# Patient Record
Sex: Male | Born: 1965 | Race: White | Hispanic: No | Marital: Single | Smoking: Former smoker
Health system: Southern US, Community
[De-identification: ages and names within clinical notes are randomized; demographics above are authoritative.]

## PROBLEM LIST (undated history)

## (undated) DIAGNOSIS — F32A Depression, unspecified: Secondary | ICD-10-CM

## (undated) DIAGNOSIS — F329 Major depressive disorder, single episode, unspecified: Secondary | ICD-10-CM

## (undated) DIAGNOSIS — F419 Anxiety disorder, unspecified: Secondary | ICD-10-CM

## (undated) DIAGNOSIS — I10 Essential (primary) hypertension: Secondary | ICD-10-CM

---

## 2000-01-19 ENCOUNTER — Emergency Department (HOSPITAL_COMMUNITY): Admission: EM | Admit: 2000-01-19 | Discharge: 2000-01-19 | Payer: Self-pay | Admitting: *Deleted

## 2000-02-04 ENCOUNTER — Emergency Department (HOSPITAL_COMMUNITY): Admission: EM | Admit: 2000-02-04 | Discharge: 2000-02-04 | Payer: Self-pay | Admitting: Emergency Medicine

## 2017-03-23 NOTE — Congregational Nurse Program (Signed)
Congregational Nurse Program Note  Date of Encounter: 03/17/2017  Past Medical History: No past medical history on file.  Encounter Details:     CNP Questionnaire - 03/17/17 1919      Patient Demographics   Is this a new or existing patient? New   Patient is considered a/an Not Applicable   Race Caucasian/White     Patient Assistance   Location of Patient Assistance Not Applicable   Patient's financial/insurance status Low Income;Self-Pay (Uninsured)   Uninsured Patient ([place] Card/Care Connects) Yes   Interventions Counseled to make appt. with provider;Assisted patient in making appt.   Patient referred to apply for the following financial assistance [employer]/Care Connect[employer]   Food insecurities addressed Not Applicable   Transportation assistance No   Assistance securing medications Yes   Type of Designer, [facility]   Educational health offerings Behavioral health     Encounter Details   Primary purpose of visit Chronic Illness/Condition Visit;Navigating the Healthcare System   Was an Emergency Department visit averted? Not Applicable   Does patient have a medical provider? No   Patient referred to Clinic   Was a mental health screening completed? (GAINS tool) No   Does patient have dental issues? No   Does patient have vision issues? No   Does your patient have an abnormal blood pressure today? No   Since previous encounter, have you referred patient for abnormal blood pressure that resulted in a new diagnosis or medication change? No   Does your patient have an abnormal blood glucose today? No   Since previous encounter, have you referred patient for abnormal blood glucose that resulted in a new diagnosis or medication change? No   Was there a life-saving intervention made? No     [facility].  Referred to [facility] Ann [facility] for [facility].  [facility] and [facility]  [facility]rapy

## 2017-04-20 NOTE — Congregational Nurse Program (Signed)
Congregational Nurse Program Note  Date of Encounter: 04/05/2017  Past Medical History: No past medical history on file.  Encounter Details:     CNP Questionnaire - 04/05/17 1723      Patient Demographics   Is this a new or existing patient? Existing   Patient is considered a/an Not Applicable   Race Caucasian/White     Patient Assistance   Location of Patient Assistance Not Applicable   Patient's financial/insurance status Low Income;Self-Pay (Uninsured)   Uninsured Patient ([place] Card/Care Connects) Yes   Interventions Counseled to make appt. with provider;Assisted patient in making appt.   Patient referred to apply for the following financial assistance [employer]/Care Connects   Food insecurities addressed Not Applicable   Transportation assistance No   Assistance securing medications No   Sales promotion account executiveducational health offerings Behavioral health     Encounter Details   Primary purpose of visit Chronic Illness/Condition Visit;Navigating the Healthcare System   Was an Emergency Department visit averted? Not Applicable   Does patient have a medical provider? No   Patient referred to Clinic   Was a mental health screening completed? (GAINS tool) No   Does patient have dental issues? No   Does patient have vision issues? No   Does your patient have an abnormal blood pressure today? No   Since previous encounter, have you referred patient for abnormal blood pressure that resulted in a new diagnosis or medication change? No   Does your patient have an abnormal blood glucose today? No   Since previous encounter, have you referred patient for abnormal blood glucose that resulted in a new diagnosis or medication change? No   Was there a life-saving intervention made? No     Client came by clinic to report that he is "doing well".  [facility]
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
## 2017-05-17 NOTE — Congregational Nurse Program (Signed)
Congregational Nurse Program Note  Date of Encounter: 05/01/2017  Past Medical History: No past medical history on file.  Encounter Details:     CNP Questionnaire - 05/10/17 1637      Patient Demographics   Is this a new or existing patient? New   Patient is considered a/an Not Applicable   Race Caucasian/White     Patient Assistance   Location of Patient Assistance Not Applicable   Patient's financial/insurance status Low Income;Self-Pay (Uninsured)   Uninsured Patient ([place] Card/Care Connects) Yes   Interventions Follow-up/Education/Support provided after completed appt.   Patient referred to apply for the following financial assistance Not Applicable   Food insecurities addressed Not Applicable   Transportation assistance Yes   Type of Assistance Bus Pass Given   Assistance securing medications No   Educational health offerings Behavioral health;Spiritual care     Encounter Details   Primary purpose of visit Chronic Illness/Condition Visit;Safety   Was an Emergency Department visit averted? Not Applicable   Does patient have a medical provider? Yes   Patient referred to Not Applicable   Was a mental health screening completed? (GAINS tool) No   Does patient have dental issues? No   Does patient have vision issues? No   Does your patient have an abnormal blood pressure today? No   Since previous encounter, have you referred patient for abnormal blood pressure that resulted in a new diagnosis or medication change? No   Does your patient have an abnormal blood glucose today? No   Since previous encounter, have you referred patient for abnormal blood glucose that resulted in a new diagnosis or medication change? No   Was there a life-saving intervention made? No     Needed assistance understanding the medicaid application.  Bus passes given to obtain medications from the health department

## 2017-05-17 NOTE — Congregational Nurse Program (Signed)
Congregational Nurse Program Note  Date of Encounter: 05/10/2017  Past Medical History: No past medical history on file.  Encounter Details:     CNP Questionnaire - 05/10/17 1637      Patient Demographics   Is this a new or existing patient? New   Patient is considered a/an Not Applicable   Race Caucasian/White     Patient Assistance   Location of Patient Assistance Not Applicable   Patient's financial/insurance status Low Income;Self-Pay (Uninsured)   Uninsured Patient ([place] Card/Care Connects) Yes   Interventions Follow-up/Education/Support provided after completed appt.   Patient referred to apply for the following financial assistance Not Applicable   Food insecurities addressed Not Applicable   Transportation assistance Yes   Type of Assistance Bus Pass Given   Assistance securing medications No   Educational health offerings Behavioral health;Spiritual care     Encounter Details   Primary purpose of visit Chronic Illness/Condition Visit;Safety   Was an Emergency Department visit averted? Not Applicable   Does patient have a medical provider? Yes   Patient referred to Not Applicable   Was a mental health screening completed? (GAINS tool) No   Does patient have dental issues? No   Does patient have vision issues? No   Does your patient have an abnormal blood pressure today? No   Since previous encounter, have you referred patient for abnormal blood pressure that resulted in a new diagnosis or medication change? No   Does your patient have an abnormal blood glucose today? No   Since previous encounter, have you referred patient for abnormal blood glucose that resulted in a new diagnosis or medication change? No   Was there a life-saving intervention made? No     Checking in.  Reported has been signed up [facility]  Bus passes to obtain medications

## 2017-05-25 NOTE — Congregational Nurse Program (Signed)
Congregational Nurse Program Note  Date of Encounter: 05/10/2017  Past Medical History: No past medical history on file.  Encounter Details:     CNP Questionnaire - 05/10/17 1637      Patient Demographics   Is this a new or existing patient? New   Patient is considered a/an Not Applicable   Race Caucasian/White     Patient Assistance   Location of Patient Assistance Not Applicable   Patient's financial/insurance status Low Income;Self-Pay (Uninsured)   Uninsured Patient ([place] Card/Care Connects) Yes   Interventions Follow-up/Education/Support provided after completed appt.   Patient referred to apply for the following financial assistance Not Applicable   Food insecurities addressed Not Applicable   Transportation assistance Yes   Type of Assistance Bus Pass Given   Assistance securing medications No   Educational health offerings Behavioral health;Spiritual care     Encounter Details   Primary purpose of visit Chronic Illness/Condition Visit;Safety   Was an Emergency Department visit averted? Not Applicable   Does patient have a medical provider? Yes   Patient referred to Not Applicable   Was a mental health screening completed? (GAINS tool) No   Does patient have dental issues? No   Does patient have vision issues? No   Does your patient have an abnormal blood pressure today? No   Since previous encounter, have you referred patient for abnormal blood pressure that resulted in a new diagnosis or medication change? No   Does your patient have an abnormal blood glucose today? No   Since previous encounter, have you referred patient for abnormal blood glucose that resulted in a new diagnosis or medication change? No   Was there a life-saving intervention made? No      Reports that he is [facility] and that is helping.  Requested bus passes to obtain medications from the health department.  Bus passes provided

## 2017-06-30 NOTE — Congregational Nurse Program (Signed)
Congregational Nurse Program Note  Date of Encounter: 06/05/2017  Past Medical History: No past medical history on file.  Encounter Details:     CNP Questionnaire - 06/05/17 0946      Patient Demographics   Is this a new or existing patient? Existing   Patient is considered a/an Not Applicable   Race Caucasian/White     Patient Assistance   Location of Patient Assistance Not Applicable   Patient's financial/insurance status Low Income;Self-Pay (Uninsured)   Uninsured Patient ([place] Card/Care Connects) Yes   Interventions Counseled to make appt. with provider   Patient referred to apply for the following financial assistance Not Applicable   Food insecurities addressed Not Applicable   Transportation assistance Yes   Type of Assistance Bus Pass Given   Assistance securing medications No   Educational health offerings Behavioral health;Spiritual care     Encounter Details   Primary purpose of visit Chronic Illness/Condition Visit;Safety   Was an Emergency Department visit averted? Not Applicable   Does patient have a medical provider? Yes   Patient referred to Not Applicable   Was a mental health screening completed? (GAINS tool) No   Does patient have dental issues? No   Does patient have vision issues? No   Does your patient have an abnormal blood pressure today? No   Since previous encounter, have you referred patient for abnormal blood pressure that resulted in a new diagnosis or medication change? No   Does your patient have an abnormal blood glucose today? No   Since previous encounter, have you referred patient for abnormal blood glucose that resulted in a new diagnosis or medication change? No   Was there a life-saving intervention made? No      Requesting assistance with obtaining medications.  Wanting refills.  Referred back to provider, Lavinia SharpsMary Ann Placey NP.  Bus passes provided so he could pick up meds at the health department

## 2017-12-01 NOTE — Congregational Nurse Program (Signed)
Congregational Nurse Program Note  Date of Encounter: 11/10/2017  Past Medical History: No past medical history on file.  Encounter Details: CNP Questionnaire - 11/10/17 1042      Questionnaire   Patient Status  Not Applicable    Location Patient Served At  Not Applicable    Insurance  Not Applicable    Uninsured  Uninsured (NEW 1x/quarter)    Food  Yes, have food insecurities;Within past 12 months, worried food would run out with no money to buy more    Housing/Utilities  No permanent housing    Transportation  Yes, need transportation assistance;Provided transportation assistance (bus pass, taxi voucher, etc.)    Interpersonal Safety  No, do not feel physically and emotionally safe where you currently live    Medication  Yes, have medication insecurities    Medical Provider  Yes    Referrals  Medication Assistance    ED Visit Averted  Not Applicable    Life-Saving Intervention Made  Not Applicable      Requested assistance with obtaining medications at the Health Department.  Bus passes provided

## 2018-07-16 ENCOUNTER — Emergency Department (HOSPITAL_COMMUNITY)
Admission: EM | Admit: 2018-07-16 | Discharge: 2018-07-17 | Disposition: A | Discharge status: Other Acute Inpatient Hospital | Payer: Self-pay | Attending: Emergency Medicine | Admitting: Emergency Medicine

## 2018-07-16 ENCOUNTER — Other Ambulatory Visit: Payer: Self-pay

## 2018-07-16 DIAGNOSIS — F1910 Other psychoactive substance abuse, uncomplicated: Secondary | ICD-10-CM | POA: Insufficient documentation

## 2018-07-16 DIAGNOSIS — F332 Major depressive disorder, recurrent severe without psychotic features: Secondary | ICD-10-CM | POA: Insufficient documentation

## 2018-07-16 DIAGNOSIS — F315 Bipolar disorder, current episode depressed, severe, with psychotic features: Secondary | ICD-10-CM

## 2018-07-16 DIAGNOSIS — R45851 Suicidal ideations: Secondary | ICD-10-CM | POA: Insufficient documentation

## 2018-07-16 LAB — CBC
HCT: 51.8 % (ref 39.0–52.0)
Hemoglobin: 19 g/dL — ABNORMAL HIGH (ref 13.0–17.0)
MCH: 35.2 pg — ABNORMAL HIGH (ref 26.0–34.0)
MCHC: 36.7 g/dL — ABNORMAL HIGH (ref 30.0–36.0)
MCV: 95.9 fL (ref 78.0–100.0)
Platelets: 230 10*3/uL (ref 150–400)
RBC: 5.4 MIL/uL (ref 4.22–5.81)
RDW: 12.4 % (ref 11.5–15.5)
WBC: 10.9 10*3/uL — ABNORMAL HIGH (ref 4.0–10.5)

## 2018-07-16 LAB — COMPREHENSIVE METABOLIC PANEL
ALT: 26 U/L (ref 0–44)
AST: 27 U/L (ref 15–41)
Albumin: 4.4 g/dL (ref 3.5–5.0)
Alkaline Phosphatase: 96 U/L (ref 38–126)
Anion gap: 18 — ABNORMAL HIGH (ref 5–15)
BUN: 7 mg/dL (ref 6–20)
CO2: 21 mmol/L — ABNORMAL LOW (ref 22–32)
Calcium: 9.2 mg/dL (ref 8.9–10.3)
Chloride: 97 mmol/L — ABNORMAL LOW (ref 98–111)
Creatinine, Ser: 0.94 mg/dL (ref 0.61–1.24)
GFR calc Af Amer: >60 mL/min (ref >60)
GFR calc non Af Amer: >60 mL/min (ref >60)
Glucose, Bld: 137 mg/dL — ABNORMAL HIGH (ref 70–99)
Potassium: 3.9 mmol/L (ref 3.5–5.1)
Sodium: 136 mmol/L (ref 135–145)
Total Bilirubin: 0.8 mg/dL (ref 0.3–1.2)
Total Protein: 8.6 g/dL — ABNORMAL HIGH (ref 6.5–8.1)

## 2018-07-16 LAB — RAPID URINE DRUG SCREEN, HOSP PERFORMED
Amphetamines: NOT DETECTED
Barbiturates: NOT DETECTED
Benzodiazepines: NOT DETECTED
Cocaine: NOT DETECTED
Opiates: NOT DETECTED
Tetrahydrocannabinol: POSITIVE — AB

## 2018-07-16 LAB — ETHANOL: Alcohol, Ethyl (B): 173 mg/dL — ABNORMAL HIGH (ref <10)

## 2018-07-16 MED ORDER — LORAZEPAM 1 MG PO TABS: 0.0000 mg | ORAL_TABLET | Freq: Two times a day (BID) | ORAL | Status: DC

## 2018-07-16 MED ORDER — LISINOPRIL 20 MG PO TABS
40.0000 mg | ORAL_TABLET | Freq: Every day | ORAL | Status: DC
  Administered 2018-07-16 – 2018-07-17 (×2): 40 mg via ORAL
  Filled 2018-07-16 (×2): qty 2

## 2018-07-16 MED ORDER — GABAPENTIN 400 MG PO CAPS
400.0000 mg | ORAL_CAPSULE | Freq: Four times a day (QID) | ORAL | Status: DC
  Administered 2018-07-16 – 2018-07-17 (×4): 400 mg via ORAL
  Filled 2018-07-16 (×4): qty 1

## 2018-07-16 MED ORDER — THIAMINE HCL 100 MG/ML IJ SOLN: 100.0000 mg | Freq: Every day | INTRAMUSCULAR | Status: DC

## 2018-07-16 MED ORDER — VITAMIN B-1 100 MG PO TABS
100.0000 mg | ORAL_TABLET | Freq: Every day | ORAL | Status: DC
  Administered 2018-07-16 – 2018-07-17 (×2): 100 mg via ORAL
  Filled 2018-07-16 (×2): qty 1

## 2018-07-16 MED ORDER — TRAZODONE HCL 50 MG PO TABS
150.0000 mg | ORAL_TABLET | Freq: Every day | ORAL | Status: DC
  Administered 2018-07-16: 150 mg via ORAL
  Filled 2018-07-16: qty 1

## 2018-07-16 MED ORDER — LORAZEPAM 2 MG/ML IJ SOLN: 0.0000 mg | Freq: Two times a day (BID) | INTRAMUSCULAR | Status: DC

## 2018-07-16 MED ORDER — LORAZEPAM 2 MG/ML IJ SOLN: 0.0000 mg | Freq: Four times a day (QID) | INTRAMUSCULAR | Status: DC

## 2018-07-16 MED ORDER — LORAZEPAM 1 MG PO TABS
0.0000 mg | ORAL_TABLET | Freq: Four times a day (QID) | ORAL | Status: DC
  Administered 2018-07-16 – 2018-07-17 (×2): 1 mg via ORAL
  Filled 2018-07-16 (×2): qty 1

## 2018-07-16 MED ORDER — DULOXETINE HCL 30 MG PO CPEP
90.0000 mg | ORAL_CAPSULE | Freq: Every day | ORAL | Status: DC
  Administered 2018-07-16 – 2018-07-17 (×2): 90 mg via ORAL
  Filled 2018-07-16 (×2): qty 3

## 2018-07-16 NOTE — Congregational Nurse Program (Signed)
Client reports he is "in a bad way".  He states he has not slept for two weeks and has not been eating.  States his depression is "very bad" and he states he feels suicidal.  On a scale of 1-10 he reports a 6.  When asked about means, he was hesitant but thought it would be through OD on heroin or medicines he had.  He has a hx of suicidal attempts in the past.  Client states [facility].  Stated he quit going after he started drinking again.  He also has not been taking his medications.  Discussed with client what his options were (seeing therapist on an emergency visit, the ED for psych evaluation or starting back on medications and returning to see me for support and [facility].  Client states, he does not feel safe from self-harm and believes he needs to go to the ED.  Encouraged client to f/u with me to develop a plan of care

## 2018-07-16 NOTE — ED Triage Notes (Addendum)
[place] from an intentional overdose of "snorting heroin". [place] bus depot when EMS was called. EMS gave Narcan twice. He has had SI thoughts for the last two weeks. Patient seen his Therapist this morning before the ingestion of heroin. [facility] and EMS brought patient to the hospital. "I am having problems with my wife and everything else".

## 2018-07-16 NOTE — ED Provider Notes (Signed)
[facility] Emergency Department Provider Note MRN:  [id number]  Arrival date & time: 07/16/18     Chief Complaint   Suicidal   History of Present Illness   Donald Carroll is a 52 y.o. year-old male with a history of depression presenting to the ED with chief complaint of suicidal ideation.  Patient explains that his marriage is recently fell apart, he has had a lot of trouble with his job, a lot of stresses in his life.  For the past 1 to 2 weeks he has felt the desire to kill himself.  He had established a safety plan with his therapist, met with the therapist this morning and told the therapist of the suicidal thoughts.  Therapist was concerned and gave him a bus pass to come to the emergency department.  At the bus stop, but the mid and individual vessels and some heroin, and he took a large dose in an attempt to kill himself.  He thinks that he took this heroin approximately 2 to 3 hours prior to arrival.  Still feels hopeless and wishing that he was dead.  Wants help for his condition.  Denies any pain, no AVH, no HI.  Review of Systems  A complete 10 system review of systems was obtained and all systems are negative except as noted in the HPI and PMH.   Patient's Health History   No past medical history on file.  Depression   No family history on file.  Social history: Separated Social History   Socioeconomic History  . Marital status: Single    Spouse name: Not on file  . Number of children: Not on file  . Years of education: Not on file  . Highest education level: Not on file  Occupational History  . Not on file  Social Needs  . Financial resource strain: Not on file  . Food insecurity:    Worry: Not on file    Inability: Not on file  . Transportation needs:    Medical: Not on file    Non-medical: Not on file  Tobacco Use  . Smoking status: Not on file  Substance and Sexual Activity  . Alcohol use: Not on file  . Drug use: Not on file  .  Sexual activity: Not on file  Lifestyle  . Physical activity:    Days per week: Not on file    Minutes per session: Not on file  . Stress: Not on file  Relationships  . Social connections:    Talks on phone: Not on file    Gets together: Not on file    Attends religious service: Not on file    Active member of club or organization: Not on file    Attends meetings of clubs or organizations: Not on file    Relationship status: Not on file  . Intimate partner violence:    Fear of current or ex partner: Not on file    Emotionally abused: Not on file    Physically abused: Not on file    Forced sexual activity: Not on file  Other Topics Concern  . Not on file  Social History Narrative  . Not on file     Physical Exam  Vital Signs and Nursing Notes reviewed Vitals:   07/16/18 2048 07/16/18 2242  BP: (!) 183/110 (!) 131/91  Pulse: 79 68  Resp: 18 18  Temp: 97.9 F (36.6 C) 97.8 F (36.6 C)  SpO2: 98% 92%  CONSTITUTIONAL: Disheveled-appearing, NAD NEURO:  Alert and oriented x 3, no focal deficits EYES:  eyes equal and reactive ENT/NECK:  no LAD, no JVD CARDIO: Regular rate, well-perfused, normal S1 and S2 PULM:  CTAB no wheezing or rhonchi GI/GU:  normal bowel sounds, non-distended, non-tender MSK/SPINE:  No gross deformities, no edema SKIN:  no rash, atraumatic PSYCH: Depressed speech and behavior  Diagnostic and Interventional Summary    EKG Interpretation  Date/Time:    Ventricular Rate:    PR Interval:    QRS Duration:   QT Interval:    QTC Calculation:   R Axis:     Text Interpretation:        Labs Reviewed  COMPREHENSIVE METABOLIC PANEL - Abnormal; Notable for the following components:      Result Value   Chloride 97 (*)    CO2 21 (*)    Glucose, Bld 137 (*)    Total Protein 8.6 (*)    Anion gap 18 (*)    All other components within normal limits  ETHANOL - Abnormal; Notable for the following components:   Alcohol, Ethyl (B) 173 (*)    All  other components within normal limits  CBC - Abnormal; Notable for the following components:   WBC 10.9 (*)    Hemoglobin 19.0 (*)    MCH 35.2 (*)    MCHC 36.7 (*)    All other components within normal limits  RAPID URINE DRUG SCREEN, HOSP PERFORMED - Abnormal; Notable for the following components:   Tetrahydrocannabinol POSITIVE (*)    All other components within normal limits    No orders to display    Medications  DULoxetine (CYMBALTA) DR capsule 90 mg (90 mg Oral Given 07/16/18 1906)  gabapentin (NEURONTIN) capsule 400 mg (400 mg Oral Given 07/16/18 2228)  lisinopril (PRINIVIL,ZESTRIL) tablet 40 mg (40 mg Oral Given 07/16/18 1906)  traZODone (DESYREL) tablet 150 mg (150 mg Oral Given 07/16/18 2228)  LORazepam (ATIVAN) injection 0-4 mg ( Intravenous See Alternative 07/16/18 2118)    Or  LORazepam (ATIVAN) tablet 0-4 mg (1 mg Oral Given 07/16/18 2118)  LORazepam (ATIVAN) injection 0-4 mg (has no administration in time range)    Or  LORazepam (ATIVAN) tablet 0-4 mg (has no administration in time range)  thiamine (VITAMIN B-1) tablet 100 mg (100 mg Oral Given 07/16/18 2120)    Or  thiamine (B-1) injection 100 mg ( Intravenous See Alternative 07/16/18 2120)     Procedures Critical Care  ED Course and Medical Decision Making  I have reviewed the triage vital signs and the nursing notes.  Pertinent labs & imaging results that were available during my care of the patient were reviewed by me and considered in my medical decision making (see below for details). Clinical Course as of Jul 16 2345  Mon Jul 16, 2018  1720 Medically cleared   [MB]    Clinical Course User Index [MB] Maudie Flakes, MD    Concern for major depressive disorder with suicidality.  Will obtain labs, clear medically, and push for psychiatric admission.  Behavioral health recommends inpatient admission.  Barth Kirks. Sedonia Small, Flor[place] [email or web address]  Final Clinical Impressions(s) / ED Diagnoses     ICD-10-CM   1. Suicidal ideation R45.851     ED Discharge Orders    None         Maudie Flakes, MD 07/16/18 [phone]

## 2018-07-16 NOTE — ED Notes (Signed)
Pt stated "I live in an apartment alone.  I think why I tried to OD on heroin is because of going through a divorce.  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 "  Pt also "I drink a 12 pack a day."

## 2018-07-16 NOTE — BH Assessment (Signed)
[facility] Assessment Progress Note  Case was staffed with Parks FNP who recommended a inpatient admission to assist with stabilization.     

## 2018-07-16 NOTE — ED Notes (Signed)
Bed: WA27 Expected date:  Expected time:  Means of arrival:  Comments: EMS-suicidal 

## 2018-07-16 NOTE — Progress Notes (Signed)
[facility].

## 2018-07-16 NOTE — ED Notes (Signed)
Dr. Pilar PlateBero informed of pt's drinking 12 pack per day, continued elevated b/p and currently with tremors.

## 2018-07-16 NOTE — BH Assessment (Addendum)
Assessment Note  Donald Carroll is an 52 y.o. male that presents this date with S/I and attempted to overdose on Heroin earlier this date in a attempt to take his life. Patient denies any H/I or AVH. Patient denies any previous attempts/gestures at self harm or previous MH inpatient admissions. Patient reports he was diagnosed with depression "years ago" when he resided in [place] although cannot recall time frame or what medications he was prescribed. Patient states he has been receiving services for the last year from [facility] NP) who assists with medication management currently being prescribed Zyprexa, Cymbalta, Gabapentin and Trazodone to assist with ongoing symptoms. Patient reports current compliance although he feels his medications have not been working for the last two months due to increased stress from a "failing marriage." Patient states he "had enough" today after a verbal altercation with his spouse and purchased 1 gram of Heroin and attempted to overdose at the bus stop. Patient reports that was the first time he ever used that substance although reports daily use of Cannabis (1 gram or more for the last year with last use earlier this date 2 grams) and daily alcohol use to include 12 or more 12 oz beers with last use earlier this date when he consumed 4 12 oz beers. UDS is pending. Patient reports current withdrawals to include tremors and agitation. 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 He was [place] bus depot when EMS was called. EMS gave Narcan twice. He has had SI thoughts for the last two weeks. Patient seen his Therapist this morning before the ingestion of heroin. [facility] and EMS brought patient to the  hospital. "I am having problems with my wife and everything else." Case was staffed with Arville Care FNP who recommended a inpatient admission to assist with stabilization.   Diagnosis: F33.2 MDD recurrent without psychotic features, severe, Polysubstance use  Past Medical History: No past medical history on file.    Family History: No family history on file.  Social History:  has no tobacco, alcohol, and drug history on file.  Additional Social History:  Alcohol / Drug Use Pain Medications: See MAR Prescriptions: See MAR Over the Counter: See MAR History of alcohol / drug use?: Yes Longest period of sobriety (when/how long): 1 year 2016 Negative Consequences of Use: Financial, Personal relationships Withdrawal Symptoms: Agitation, Weakness, Tremors Substance #1 Name of Substance 1: Cannabis 1 - Age of First Use: 27 1 - Amount (size/oz): 1 gram  1 - Frequency: Daily 1 - Duration: Last "few days"  1 - Last Use / Amount: 07/16/18 1 gram  Substance #2 Name of Substance 2: Heroin 2 - Age of First Use: 52 2 - Amount (size/oz): 1 gram 2 - Frequency: Pt states "today was the first day" 2 - Duration: 1 day this date 2 - Last Use / Amount: 07/16/18 1 gram Substance #3 Name of Substance 3: Alcohol 3 - Age of First Use: 23 3 - Amount (size/oz): 12 oz beers  3 - Frequency: Daily 3 - Duration: Last five years  3 - Last Use / Amount: 07/16/18 4 12  oz beers  CIWA: CIWA-Ar BP: (!) 181/122 Pulse Rate: (!) 113 COWS:  Allergies: No Known Allergies  Home Medications:  (Not in a hospital admission)  OB/GYN Status:  No LMP for male patient.  General Assessment Data Assessment unable to be completed: Yes Reason for not completing assessment: Pt is impaired Location of Assessment: [facility] TTS Assessment: In system Is this a Tele or Face-to-Face Assessment?: Face-to-Face Is this an Initial Assessment or a Re-assessment for this encounter?: Initial Assessment Patient Accompanied by::  (NA) Language Other than English: No Living Arrangements: (With wife) What gender do you identify as?: Male Marital status: Married Jordan name: NA Pregnancy Status: No Living Arrangements: Spouse/significant other Can pt return to current living arrangement?: Yes Admission Status: Voluntary Is patient capable of signing voluntary admission?: Yes Referral Source: Self/Family/Friend Insurance type: Self Pay  Medical Screening Exam [facility] Walk-in ONLY) Medical Exam completed: Yes  Crisis Care Plan Living Arrangements: Spouse/significant other Legal Guardian: (NA) Name of Psychiatrist: [facility]y NP Name of Therapist: [facility]  Education Status Is patient currently in school?: No Is the patient employed, unemployed or receiving disability?: Employed  Risk to self with the past 6 months Suicidal Ideation: Yes-Currently Present Has patient been a risk to self within the past 6 months prior to admission? : No Suicidal Intent: Yes-Currently Present Has patient had any suicidal intent within the past 6 months prior to admission? : No Is patient at risk for suicide?: Yes Suicidal Plan?: Yes-Currently Present Has patient had any suicidal plan within the past 6 months prior to admission? : No Specify Current Suicidal Plan: Overdose Access to Means: Yes Specify Access to Suicidal Means: Pt had drugs earlier What has been your use of drugs/alcohol within the last 12 months?: Current use Previous Attempts/Gestures: No How many times?: 0 Other Self Harm Risks: NA Triggers for Past Attempts: Unknown Intentional Self Injurious Behavior: None Family Suicide History: No Recent stressful life event(s): Conflict (Comment)(Maritial issues) Persecutory voices/beliefs?: No Depression: Yes Depression Symptoms: Isolating, Fatigue, Guilt, Feeling worthless/self pity Substance abuse history and/or treatment for substance abuse?: Yes Suicide prevention information given to  non-admitted patients: Not applicable  Risk to Others within the past 6 months Homicidal Ideation: No Does patient have any lifetime risk of violence toward others beyond the six months prior to admission? : No Thoughts of Harm to Others: No Current Homicidal Intent: No Current Homicidal Plan: No Access to Homicidal Means: No Identified Victim: NA History of harm to others?: No Assessment of Violence: None Noted Violent Behavior Description: NA Does patient have access to weapons?: No Criminal Charges Pending?: No Does patient have a court date: No Is patient on probation?: No  Psychosis Hallucinations: None noted Delusions: None noted  Mental Status Report Appearance/Hygiene: In scrubs Eye Contact: Good Motor Activity: Unremarkable Speech: Logical/coherent Level of Consciousness: Quiet/awake Mood: Depressed Affect: Appropriate to circumstance Anxiety Level: Minimal Thought Processes: Coherent, Relevant Judgement: Partial Orientation: Person, Place, Time Obsessive Compulsive Thoughts/Behaviors: None  Cognitive Functioning Concentration: Good Memory: Recent Intact, Remote Intact Is patient IDD: No Insight: Good Impulse Control: Poor Appetite: Fair Have you had any weight changes? : No Change Sleep: Decreased Total Hours of Sleep: (Pt reports he has not slept in over three days) Vegetative Symptoms: None  ADLScreening [facility] Assessment Services) Patient's cognitive ability adequate to safely complete daily activities?: Yes Patient able to express need for assistance with ADLs?: Yes Independently performs ADLs?: Yes (appropriate for developmental age)  Prior Inpatient Therapy Prior Inpatient Therapy: No  Prior Outpatient Therapy Prior Outpatient Therapy: Yes Prior Therapy Dates: Ongoing Prior  Therapy Facilty/Provider(s): [facility] Reason for Treatment: Med mang counseling Does patient have an ACCT team?: No Does patient have Intensive  In-House Services?  : No Does patient have [facility] services? : No Does patient have P4CC services?: No  ADL Screening (condition at time of admission) Patient's cognitive ability adequate to safely complete daily activities?: Yes Is the patient deaf or have difficulty hearing?: No Does the patient have difficulty seeing, even when wearing glasses/contacts?: No Does the patient have difficulty concentrating, remembering, or making decisions?: No Patient able to express need for assistance with ADLs?: Yes Does the patient have difficulty dressing or bathing?: No Independently performs ADLs?: Yes (appropriate for developmental age) Does the patient have difficulty walking or climbing stairs?: No Weakness of Legs: None Weakness of Arms/Hands: None  Home Assistive Devices/Equipment Home Assistive Devices/Equipment: None  Therapy Consults (therapy consults require a physician order) PT Evaluation Needed: No OT Evalulation Needed: No SLP Evaluation Needed: No Abuse/Neglect Assessment (Assessment to be complete while patient is alone) Physical Abuse: Denies Verbal Abuse: Denies Sexual Abuse: Denies Exploitation of patient/patient's resources: Denies Self-Neglect: Denies Values / Beliefs Cultural Requests During Hospitalization: None Spiritual Requests During Hospitalization: None Consults Spiritual Care Consult Needed: No Social Work Consult Needed: No Merchant navy officerAdvance Directives (For Healthcare) Does Patient Have a Medical Advance Directive?: No Would patient like information on creating a medical advance directive?: No - Patient declined          Disposition: Case was staffed with Arville CareParks FNP who recommended a inpatient admission to assist with stabilization.   Disposition Initial Assessment Completed for this Encounter: Yes Disposition of Patient: Admit Type of inpatient treatment program: Adult Patient refused recommended treatment: No Mode of transportation if patient is  discharged?: (Unk)  On Site Evaluation by:   Reviewed with Physician:    Alfredia Fergusonavid L Alisah Grandberry 07/16/2018 5:19 PM

## 2018-07-17 ENCOUNTER — Other Ambulatory Visit: Payer: Self-pay

## 2018-07-17 ENCOUNTER — Inpatient Hospital Stay (HOSPITAL_COMMUNITY)
Admission: AD | Admit: 2018-07-17 | Discharge: 2018-07-24 | DRG: 885 | Disposition: A | Discharge status: Home / Self Care | Payer: Federal, State, Local not specified - Other | Source: Intra-hospital | Attending: Psychiatry | Admitting: Psychiatry

## 2018-07-17 ENCOUNTER — Encounter (HOSPITAL_COMMUNITY): Payer: Self-pay | Admitting: *Deleted

## 2018-07-17 DIAGNOSIS — Z915 Personal history of self-harm: Secondary | ICD-10-CM | POA: Diagnosis not present

## 2018-07-17 DIAGNOSIS — G8929 Other chronic pain: Secondary | ICD-10-CM | POA: Diagnosis present

## 2018-07-17 DIAGNOSIS — F1210 Cannabis abuse, uncomplicated: Secondary | ICD-10-CM | POA: Diagnosis present

## 2018-07-17 DIAGNOSIS — T401X2A Poisoning by heroin, intentional self-harm, initial encounter: Secondary | ICD-10-CM | POA: Diagnosis not present

## 2018-07-17 DIAGNOSIS — F332 Major depressive disorder, recurrent severe without psychotic features: Secondary | ICD-10-CM | POA: Diagnosis present

## 2018-07-17 DIAGNOSIS — R45851 Suicidal ideations: Secondary | ICD-10-CM | POA: Diagnosis not present

## 2018-07-17 DIAGNOSIS — G4700 Insomnia, unspecified: Secondary | ICD-10-CM

## 2018-07-17 DIAGNOSIS — F419 Anxiety disorder, unspecified: Secondary | ICD-10-CM | POA: Diagnosis not present

## 2018-07-17 DIAGNOSIS — F17210 Nicotine dependence, cigarettes, uncomplicated: Secondary | ICD-10-CM | POA: Diagnosis present

## 2018-07-17 DIAGNOSIS — Z630 Problems in relationship with spouse or partner: Secondary | ICD-10-CM

## 2018-07-17 DIAGNOSIS — F4310 Post-traumatic stress disorder, unspecified: Secondary | ICD-10-CM | POA: Diagnosis present

## 2018-07-17 DIAGNOSIS — Z62810 Personal history of physical and sexual abuse in childhood: Secondary | ICD-10-CM | POA: Diagnosis present

## 2018-07-17 DIAGNOSIS — Z23 Encounter for immunization: Secondary | ICD-10-CM | POA: Diagnosis not present

## 2018-07-17 DIAGNOSIS — T1491XA Suicide attempt, initial encounter: Secondary | ICD-10-CM

## 2018-07-17 DIAGNOSIS — F1020 Alcohol dependence, uncomplicated: Secondary | ICD-10-CM

## 2018-07-17 DIAGNOSIS — F10239 Alcohol dependence with withdrawal, unspecified: Secondary | ICD-10-CM | POA: Diagnosis present

## 2018-07-17 DIAGNOSIS — I10 Essential (primary) hypertension: Secondary | ICD-10-CM | POA: Diagnosis present

## 2018-07-17 DIAGNOSIS — F315 Bipolar disorder, current episode depressed, severe, with psychotic features: Principal | ICD-10-CM | POA: Diagnosis present

## 2018-07-17 DIAGNOSIS — F1024 Alcohol dependence with alcohol-induced mood disorder: Secondary | ICD-10-CM

## 2018-07-17 HISTORY — DX: Depression, unspecified: F32.A

## 2018-07-17 HISTORY — DX: Essential (primary) hypertension: I10

## 2018-07-17 HISTORY — DX: Anxiety disorder, unspecified: F41.9

## 2018-07-17 HISTORY — DX: Major depressive disorder, single episode, unspecified: F32.9

## 2018-07-17 MED ORDER — PNEUMOCOCCAL VAC POLYVALENT 25 MCG/0.5ML IJ INJ
0.5000 mL | INJECTION | INTRAMUSCULAR | Status: AC
  Administered 2018-07-18: 0.5 mL via INTRAMUSCULAR

## 2018-07-17 MED ORDER — LORAZEPAM 2 MG/ML IJ SOLN: 0.0000 mg | Freq: Four times a day (QID) | INTRAMUSCULAR | Status: DC

## 2018-07-17 MED ORDER — ALUM & MAG HYDROXIDE-SIMETH 200-200-20 MG/5ML PO SUSP: 30.0000 mL | ORAL | Status: DC | PRN

## 2018-07-17 MED ORDER — THIAMINE HCL 100 MG/ML IJ SOLN: 100.0000 mg | Freq: Every day | INTRAMUSCULAR | Status: DC

## 2018-07-17 MED ORDER — INFLUENZA VAC SPLIT QUAD 0.5 ML IM SUSY
0.5000 mL | PREFILLED_SYRINGE | INTRAMUSCULAR | Status: AC
  Administered 2018-07-18: 0.5 mL via INTRAMUSCULAR
  Filled 2018-07-17: qty 0.5

## 2018-07-17 MED ORDER — LORAZEPAM 1 MG PO TABS
0.0000 mg | ORAL_TABLET | Freq: Four times a day (QID) | ORAL | Status: DC
  Administered 2018-07-17: 1 mg via ORAL

## 2018-07-17 MED ORDER — LORAZEPAM 2 MG/ML IJ SOLN: 0.0000 mg | Freq: Two times a day (BID) | INTRAMUSCULAR | Status: DC

## 2018-07-17 MED ORDER — NICOTINE 21 MG/24HR TD PT24
21.0000 mg | MEDICATED_PATCH | Freq: Every day | TRANSDERMAL | Status: DC
  Administered 2018-07-18 – 2018-07-23 (×6): 21 mg via TRANSDERMAL
  Filled 2018-07-17 (×9): qty 1

## 2018-07-17 MED ORDER — ACETAMINOPHEN 325 MG PO TABS
650.0000 mg | ORAL_TABLET | Freq: Four times a day (QID) | ORAL | Status: DC | PRN
  Administered 2018-07-18 – 2018-07-19 (×2): 650 mg via ORAL
  Filled 2018-07-17 (×2): qty 2

## 2018-07-17 MED ORDER — LORAZEPAM 1 MG PO TABS: 0.0000 mg | ORAL_TABLET | Freq: Two times a day (BID) | ORAL | Status: DC

## 2018-07-17 MED ORDER — TRAZODONE HCL 150 MG PO TABS
150.0000 mg | ORAL_TABLET | Freq: Every day | ORAL | Status: DC
  Administered 2018-07-17: 150 mg via ORAL
  Filled 2018-07-17 (×3): qty 1

## 2018-07-17 MED ORDER — GABAPENTIN 400 MG PO CAPS
400.0000 mg | ORAL_CAPSULE | Freq: Four times a day (QID) | ORAL | Status: DC
  Administered 2018-07-17 – 2018-07-22 (×21): 400 mg via ORAL
  Filled 2018-07-17: qty 56
  Filled 2018-07-17 (×4): qty 1
  Filled 2018-07-17: qty 56
  Filled 2018-07-17 (×2): qty 1
  Filled 2018-07-17 (×2): qty 56
  Filled 2018-07-17 (×8): qty 1
  Filled 2018-07-17: qty 56
  Filled 2018-07-17: qty 1
  Filled 2018-07-17 (×3): qty 56
  Filled 2018-07-17 (×3): qty 1
  Filled 2018-07-17: qty 56
  Filled 2018-07-17: qty 1
  Filled 2018-07-17: qty 56
  Filled 2018-07-17: qty 1
  Filled 2018-07-17 (×2): qty 56
  Filled 2018-07-17 (×3): qty 1
  Filled 2018-07-17: qty 56

## 2018-07-17 MED ORDER — VITAMIN B-1 100 MG PO TABS
100.0000 mg | ORAL_TABLET | Freq: Every day | ORAL | Status: DC
  Administered 2018-07-18 – 2018-07-22 (×5): 100 mg via ORAL
  Filled 2018-07-17 (×9): qty 1

## 2018-07-17 MED ORDER — LISINOPRIL 20 MG PO TABS
40.0000 mg | ORAL_TABLET | Freq: Every day | ORAL | Status: DC
  Administered 2018-07-18 – 2018-07-23 (×6): 40 mg via ORAL
  Filled 2018-07-17 (×2): qty 2
  Filled 2018-07-17: qty 28
  Filled 2018-07-17: qty 2
  Filled 2018-07-17: qty 28
  Filled 2018-07-17 (×2): qty 2
  Filled 2018-07-17: qty 28
  Filled 2018-07-17 (×2): qty 2

## 2018-07-17 MED ORDER — DULOXETINE HCL 60 MG PO CPEP
90.0000 mg | ORAL_CAPSULE | Freq: Every day | ORAL | Status: DC
  Administered 2018-07-18: 90 mg via ORAL
  Filled 2018-07-17 (×4): qty 1

## 2018-07-17 MED ORDER — MAGNESIUM HYDROXIDE 400 MG/5ML PO SUSP: 30.0000 mL | Freq: Every day | ORAL | Status: DC | PRN

## 2018-07-17 NOTE — Tx Team (Signed)
Initial Treatment Plan 07/17/2018 7:20 PM Donald SaxWarren W Tesmer AVW:[id number]    PATIENT STRESSORS: Marital or family conflict Substance abuse   PATIENT STRENGTHS: Ability for insight Average or above average intelligence Capable of independent living General fund of knowledge Motivation for treatment/growth   PATIENT IDENTIFIED PROBLEMS: Depression Suicidal actions Substance Abuse "Help me with my sobriety and mental health"                     DISCHARGE CRITERIA:  Ability to meet basic life and health needs Improved stabilization in mood, thinking, and/or behavior Reduction of life-threatening or endangering symptoms to within safe limits Verbal commitment to aftercare and medication compliance Withdrawal symptoms are absent or subacute and managed without 24-hour nursing intervention  PRELIMINARY DISCHARGE PLAN: Attend aftercare/continuing care group Return to previous living arrangement  PATIENT/FAMILY INVOLVEMENT: This treatment plan has been presented to and reviewed with the patient, Donald Carroll, and/or family member, .  The patient and family have been given the opportunity to ask questions and make suggestions.  Legend Pecore, [place], [place]RN 07/17/2018, 7:20 PM

## 2018-07-17 NOTE — Progress Notes (Signed)
Donald Carroll is a 52 year old male pt admitted on voluntary basis. On admission, he reports that he has been feeling depressed and suicidal and reports that he attempted to overdose on heroin. He reports that he [place] and snorted it. He reports on-going depression and does endorse passive SI thoughts but is able to contract for safety while in the hospital. He reports conflict with his wife and reports that he has had a history of substance abuse in the past. He reports that he has been taking his medications as prescribed. He reports that he lives alone and reports that he will return to the same living situation upon discharge. Donald Carroll was escorted to the unit, oriented to the milieu and safety maintained.

## 2018-07-17 NOTE — ED Notes (Signed)
A&Ox3. NAD. Reports SI with plan to OD. Calm and cooperative. Took a shower, but has some body odor.

## 2018-07-17 NOTE — ED Notes (Signed)
Bed: WBH39 Expected date:  Expected time:  Means of arrival:  Comments: Hold for 27 

## 2018-07-17 NOTE — ED Notes (Signed)
[facility] [employer]. All belongings returned to pt who signed for same. Pt was calm and cooperative with plan.

## 2018-07-17 NOTE — Consult Note (Addendum)
[facility] Face-to-Face Psychiatry Consult   Reason for Consult: Suicide attempt Referring Physician:  Dr. Sedonia Small Patient Identification: Donald Carroll MRN:  [id number] Principal Diagnosis: MDD (major depressive disorder), recurrent episode, severe ([place]) Diagnosis:   Patient Active Problem List   Diagnosis Date Noted  . MDD (major depressive disorder), recurrent episode, severe ([place]) [F33.2] 07/17/2018    Total Time spent with patient: 30 minutes  Subjective:   Donald Carroll is a 52 y.o. male patient admitted with suicidal attempt s/p overdosing on heroin. He reports ongiong marital issues, and recent alteraction with his wife. He reports worsening use of alcohol and illicit substnaces over time. He reports that they seperated one month ago, and he has been smoking pot and drinking a 12 pack of beer daily since. At this time he is unable to contract for safety and continues to endorse active suicidal ideations.   HPI:  Donald Carroll is an 52 y.o. male that presents this date with S/I and attempted to overdose on Heroin earlier this date in a attempt to take his life. Patient denies any H/I or AVH. Patient denies any previous attempts/gestures at self harm or previous [place] inpatient admissions. Patient reports he was diagnosed with depression "years ago" when he resided in [place] although cannot recall time frame or what medications he was prescribed. Patient states he has been receiving services for the last year from [facility] NP) who assists with medication management currently being prescribed Zyprexa, Cymbalta, Gabapentin and Trazodone to assist with ongoing symptoms. Patient reports current compliance although he feels his medications have not been working for the last two months due to increased stress from a "failing marriage." Patient states he "had enough" today after a verbal altercation with his spouse and purchased 1 gram of Heroin and attempted to overdose at the bus stop.  Patient reports that was the first time he ever used that substance although reports daily use of Cannabis (1 gram or more for the last year with last use earlier this date 2 grams) and daily alcohol use to include 12 or more 12 oz beers with last use earlier this date when he consumed 4 12 oz beers. UDS is pending. Patient reports current withdrawals to include tremors and agitation. 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 EMS gave Narcan twice. He has hadSI thoughts for the last two weeks. Patient seen his therapist this morning before the ingestion of heroin. [facility] and EMS brought patient to the hospital. "I am having problems with my wife and everything else." Case was staffed with Romilda Garret FNP who recommended a inpatient admission to assist with stabilization.   Past Psychiatric History: MDD, B[place]oalr, PTSD. Previous suicide attempts by OD, and multiple inpatient hospitalizations with last recent being 1.5 years ago.   Risk to Self: Suicidal Ideation: Yes-Currently Present Suicidal Intent: Yes-Currently Present Is patient at risk for suicide?: Yes Suicidal Plan?: Yes-Currently Present Specify Current Suicidal Plan: Overdose Access to Means: Yes Specify Access to Suicidal Means: Pt had drugs earlier What has been your use of drugs/alcohol within the last 12 months?: Current use How many times?: 0 Other Self Harm Risks: NA  Triggers for Past Attempts: Unknown Intentional Self Injurious Behavior: None Risk to Others: Homicidal Ideation: No Thoughts of Harm to Others: No Current Homicidal Intent: No Current Homicidal Plan: No Access to Homicidal Means: No Identified Victim:  NA History of harm to others?: No Assessment of Violence: None Noted Violent Behavior Description: NA Does patient have access to weapons?: No Criminal Charges Pending?: No Does patient have a court date: No Prior Inpatient Therapy: Prior Inpatient Therapy: No Prior Outpatient Therapy: Prior Outpatient Therapy: Yes Prior Therapy Dates: Ongoing Prior Therapy Facilty/Provider(s): [facility] Reason for Treatment: Med mang counseling Does patient have an ACCT team?: No Does patient have Intensive In-House Services?  : No Does patient have [facility] services? : No Does patient have P4CC services?: No  Past Medical History: No past medical history on file.  Family History: No family history on file. Family Psychiatric  History:As per patient he denies Social History:  Social History   Substance and Sexual Activity  Alcohol Use Not on file     Social History   Substance and Sexual Activity  Drug Use Not on file    Social History   Socioeconomic History  . Marital status: Single    Spouse name: Not on file  . Number of children: Not on file  . Years of education: Not on file  . Highest education level: Not on file  Occupational History  . Not on file  Social Needs  . Financial resource strain: Not on file  . Food insecurity:    Worry: Not on file    Inability: Not on file  . Transportation needs:    Medical: Not on file    Non-medical: Not on file  Tobacco Use  . Smoking status: Not on file  Substance and Sexual Activity  . Alcohol use: Not on file  . Drug use: Not on file  . Sexual activity: Not on file  Lifestyle  . Physical activity:    Days per week: Not on file    Minutes per session: Not on file  . Stress: Not on file  Relationships  . Social connections:    Talks on phone: Not on file    Gets together: Not on file    Attends religious service: Not on file    Active member of club or organization: Not on file    Attends meetings of  clubs or organizations: Not on file    Relationship status: Not on file  Other Topics Concern  . Not on file  Social History Narrative  . Not on file   Additional Social History: He lives alone. He has one adult son.     Allergies:  No Known Allergies  Labs:  Results for orders placed or performed during the hospital encounter of 07/16/18 (from the past 48 hour(s))  Rapid urine drug screen (hospital performed)     Status: Abnormal   Collection Time: 07/16/18  3:08 PM  Result Value Ref Range   Opiates NONE DETECTED NONE DETECTED   Cocaine NONE DETECTED NONE DETECTED   Benzodiazepines NONE DETECTED NONE DETECTED   Amphetamines NONE DETECTED NONE DETECTED   Tetrahydrocannabinol POSITIVE (A) NONE DETECTED   Barbiturates NONE DETECTED NONE DETECTED    Comment: (NOTE) DRUG SCREEN FOR MEDICAL PURPOSES ONLY.  IF CONFIRMATION IS NEEDED FOR ANY PURPOSE, NOTIFY LAB WITHIN 5 DAYS. LOWEST DETECTABLE LIMITS FOR URINE DRUG SCREEN Drug Class  Cutoff (ng/mL) Amphetamine and metabolites    1000 Barbiturate and metabolites    200 Benzodiazepine                 944 Tricyclics and metabolites     300 Opiates and metabolites        300 Cocaine and metabolites        300 THC                            50 Performed at [facility], [place] [address] [place], [place] [address]   Comprehensive metabolic panel     Status: Abnormal   Collection Time: 07/16/18  3:49 PM  Result Value Ref Range   Sodium 136 135 - 145 mmol/L   Potassium 3.9 3.5 - 5.1 mmol/L   Chloride 97 (L) 98 - 111 mmol/L   CO2 21 (L) 22 - 32 mmol/L   Glucose, Bld 137 (H) 70 - 99 mg/dL   BUN 7 6 - 20 mg/dL   Creatinine, Ser 0.94 0.61 - 1.24 mg/dL   Calcium 9.2 8.9 - 10.3 mg/dL   Total Protein 8.6 (H) 6.5 - 8.1 g/dL   Albumin 4.4 3.5 - 5.0 g/dL   AST 27 15 - 41 U/L   ALT 26 0 - 44 U/L   Alkaline Phosphatase 96 38 - 126 U/L   Total Bilirubin 0.8 0.3 - 1.2 mg/dL   GFR calc non Af Amer >60  >60 mL/min   GFR calc Af Amer >60 >60 mL/min    Comment: (NOTE) The eGFR has been calculated using the CKD EPI equation. This calculation has not been validated in all clinical situations. eGFR's persistently <60 mL/min signify possible Chronic Kidney Disease.    Anion gap 18 (H) 5 - 15    Comment: Performed at [facility], [place] [address] [place], [place] [address]  Ethanol     Status: Abnormal   Collection Time: 07/16/18  3:49 PM  Result Value Ref Range   Alcohol, Ethyl (B) 173 (H) <10 mg/dL    Comment: (NOTE) Lowest detectable limit for serum alcohol is 10 mg/dL. For medical purposes only. Performed at [facility], [place] [address] [place], [place] [address]   cbc     Status: Abnormal   Collection Time: 07/16/18  3:49 PM  Result Value Ref Range   WBC 10.9 (H) 4.0 - 10.5 K/uL   RBC 5.40 4.22 - 5.81 MIL/uL   Hemoglobin 19.0 (H) 13.0 - 17.0 g/dL   HCT 51.8 39.0 - 52.0 %   MCV 95.9 78.0 - 100.0 fL   MCH 35.2 (H) 26.0 - 34.0 pg   MCHC 36.7 (H) 30.0 - 36.0 g/dL   RDW 12.4 11.5 - 15.5 %   Platelets 230 150 - 400 K/uL    Comment: Performed at [facility], [place] [address], [place], [place] [address]    Current Facility-Administered Medications  Medication Dose Route Frequency Provider Last Rate Last Dose  . DULoxetine (CYMBALTA) DR capsule 90 mg  90 mg Oral Daily Maudie Flakes, MD   90 mg at 07/17/18 1101  . gabapentin (NEURONTIN) capsule 400 mg  400 mg Oral QID Maudie Flakes, MD   400 mg at 07/17/18 1102  . lisinopril (PRINIVIL,ZESTRIL) tablet 40 mg  40 mg Oral Daily Maudie Flakes, MD   40 mg at 07/17/18 1102  . LORazepam (ATIVAN) injection 0-4  mg  0-4 mg Intravenous Q6H Maudie Flakes, MD       Or  . LORazepam (ATIVAN) tablet 0-4 mg  0-4 mg Oral Q6H Maudie Flakes, MD   1 mg at 07/16/18 2118  . [START ON 07/19/2018] LORazepam (ATIVAN) injection 0-4 mg  0-4 mg Intravenous Q12H Maudie Flakes, MD       Or  .  Derrill Memo ON 07/19/2018] LORazepam (ATIVAN) tablet 0-4 mg  0-4 mg Oral Q12H Maudie Flakes, MD      . thiamine (VITAMIN B-1) tablet 100 mg  100 mg Oral Daily Maudie Flakes, MD   100 mg at 07/17/18 1101   Or  . thiamine (B-1) injection 100 mg  100 mg Intravenous Daily Maudie Flakes, MD      . traZODone (DESYREL) tablet 150 mg  150 mg Oral QHS Maudie Flakes, MD   150 mg at 07/16/18 2228   Current Outpatient Medications  Medication Sig Dispense Refill  . DULoxetine (CYMBALTA) 30 MG capsule Take 90 mg by mouth daily.    Marland Kitchen gabapentin (NEURONTIN) 400 MG capsule Take 400 mg by mouth 4 (four) times daily.    Marland Kitchen lisinopril (PRINIVIL,ZESTRIL) 40 MG tablet Take 40 mg by mouth daily.    . naproxen sodium (ALEVE) 220 MG tablet Take 220 mg by mouth daily as needed (pain).    Marland Kitchen OLANZapine (ZYPREXA) 15 MG tablet Take 15 mg by mouth at bedtime.    . traZODone (DESYREL) 150 MG tablet Take 150 mg by mouth at bedtime.      Musculoskeletal: Strength & Muscle Tone: within normal limits Gait & Station: normal Patient leans: N/A  Psychiatric Specialty Exam: Physical Exam  Nursing note and vitals reviewed. Constitutional: He is oriented to person, place, and time. He appears well-developed and well-nourished.  HENT:  Head: Normocephalic and atraumatic.  Eyes: Pupils are equal, round, and reactive to light.  Neck: Normal range of motion.  Respiratory: Effort normal.  Musculoskeletal: Normal range of motion.  Neurological: He is alert and oriented to person, place, and time.  Skin: Skin is warm and dry.  Psychiatric: His behavior is normal. Judgment and thought content normal. He exhibits a depressed mood.    Review of Systems  Neurological: Positive for tremors.  Psychiatric/Behavioral: Positive for depression, substance abuse and suicidal ideas. The patient is nervous/anxious and has insomnia.   All other systems reviewed and are negative.   Blood pressure 119/89, pulse 93, temperature 97.6 F (36.4  C), temperature source Oral, resp. rate 18, height 5' 9"  (1.753 m), weight 72.6 kg, SpO2 96 %.Body mass index is 23.63 kg/m.  General Appearance: Disheveled and wearing paper scrubs  Eye Contact:  Minimal  Speech:  Clear and Coherent and Slow  Volume:  Normal  Mood:  Depressed and Dysphoric  Affect:  Congruent, Depressed and Restricted  Thought Process:  Coherent, Linear and Descriptions of Associations: Intact  Orientation:  Full (Time, Place, and Person)  Thought Content:  Logical  Suicidal Thoughts:  Yes.  with intent/plan  Homicidal Thoughts:  No  Memory:  Immediate;   Fair Recent;   Fair  Judgement:  Impaired  Insight:  Shallow  Psychomotor Activity:  Normal  Concentration:  Concentration: Fair and Attention Span: Fair  Recall:  [employer] of Knowledge:  Fair  Language:  Fair  Akathisia:  No  Handed:  Right  AIMS (if indicated):   N/A  Assets:  Communication Skills Desire for Improvement Financial Resources/Insurance  Housing Social Support  ADL's:  Intact  Cognition:  WNL  Sleep:   N/A     Treatment Plan Summary: Daily contact with patient to assess and evaluate symptoms and progress in treatment and Medication management  Disposition: Recommend psychiatric Inpatient admission when medically cleared. Refer to [facility] and other inpatient facilities for med management and crisis stabilization. Will place on CIWA and Ativan detox protocol at this time.  Nanci Pina, FNP 07/17/2018 11:32 AM   Patient seen face-to-face for psychiatric evaluation, chart reviewed and case discussed with the physician extender and developed treatment plan. Reviewed the information documented and agree with the treatment plan.  Buford Dresser, DO 07/17/18 4:40 PM

## 2018-07-17 NOTE — BH Assessment (Addendum)
[facility]  Per Juanetta BeetsJacqueline Norman, DO, this pt requires psychiatric hospitalization at this time.  Malva LimesLinsey Strader, RN, [facility]; she will call [facility].  Pt has signed Voluntary Admission and Consent for Treatment, as well as Consent to Release Information to Phillips OdorMarianne Placey at [facility], and a notification call has been placed.  Signed [facility].  Pt's nurse, Diane, has been notified, and agrees to send original paperwork along with pt via [facility] [place], and to call report to [phone].  Doylene Canninghomas Yamil Oelke, [place] Behavioral Health Coordinator [phone]  Addendum:  At 13:48 Linsey calls to report [facility] at 17:00.  Diane has been notified.  Doylene Canninghomas Oni Dietzman, [place] Behavioral Health Coordinator [phone]

## 2018-07-17 NOTE — Progress Notes (Signed)
Nursing Progress Note: 7p-7a D: Pt currently presents with a depressed/anxious affect and behavior. Pt states "I had a good day it was just hard to get here. I know I've needed help, but I am just now seeking it out." Interacting minimally with the milieu. Pt reports poor sleep during the previous night. Pt did attend wrap-up group.  A: Pt provided with medications per providers orders. Pt's labs and vitals were monitored throughout the night. Pt supported emotionally and encouraged to express concerns and questions. Pt educated on medications.  R: Pt's safety ensured with 15 minute and environmental checks. Pt currently denies HI and AVH and endorses passive SI. Pt verbally contracts to seek staff if SI,HI, or AVH occurs and to consult with staff before acting on any harmful thoughts. Will continue to monitor.

## 2018-07-17 NOTE — ED Notes (Signed)
Report given and Casimiro NeedleMichael [facility] and [employer] cal[employer]d

## 2018-07-18 DIAGNOSIS — T1491XA Suicide attempt, initial encounter: Secondary | ICD-10-CM

## 2018-07-18 DIAGNOSIS — F17210 Nicotine dependence, cigarettes, uncomplicated: Secondary | ICD-10-CM

## 2018-07-18 DIAGNOSIS — F1024 Alcohol dependence with alcohol-induced mood disorder: Secondary | ICD-10-CM

## 2018-07-18 DIAGNOSIS — F4310 Post-traumatic stress disorder, unspecified: Secondary | ICD-10-CM

## 2018-07-18 DIAGNOSIS — G4700 Insomnia, unspecified: Secondary | ICD-10-CM

## 2018-07-18 DIAGNOSIS — F1020 Alcohol dependence, uncomplicated: Secondary | ICD-10-CM

## 2018-07-18 DIAGNOSIS — T401X2A Poisoning by heroin, intentional self-harm, initial encounter: Secondary | ICD-10-CM

## 2018-07-18 DIAGNOSIS — F419 Anxiety disorder, unspecified: Secondary | ICD-10-CM

## 2018-07-18 LAB — HEMOGLOBIN A1C
Hgb A1c MFr Bld: 4.6 % — ABNORMAL LOW (ref 4.8–5.6)
Mean Plasma Glucose: 85.32 mg/dL

## 2018-07-18 LAB — LIPID PANEL
CHOL/HDL RATIO: 4.8 ratio
Cholesterol: 179 mg/dL (ref 0–200)
HDL: 37 mg/dL — ABNORMAL LOW (ref >40)
LDL CALC: 70 mg/dL (ref 0–99)
Triglycerides: 359 mg/dL — ABNORMAL HIGH (ref <150)
VLDL: 72 mg/dL — AB (ref 0–40)

## 2018-07-18 LAB — TSH: TSH: 3.876 u[IU]/mL (ref 0.350–4.500)

## 2018-07-18 MED ORDER — TRAZODONE HCL 100 MG PO TABS
200.0000 mg | ORAL_TABLET | Freq: Every day | ORAL | Status: DC
  Administered 2018-07-18 – 2018-07-22 (×5): 200 mg via ORAL
  Filled 2018-07-18: qty 2
  Filled 2018-07-18: qty 28
  Filled 2018-07-18: qty 2
  Filled 2018-07-18: qty 28
  Filled 2018-07-18 (×3): qty 2
  Filled 2018-07-18: qty 28

## 2018-07-18 MED ORDER — DULOXETINE HCL 60 MG PO CPEP
60.0000 mg | ORAL_CAPSULE | Freq: Every day | ORAL | Status: DC
  Administered 2018-07-19 – 2018-07-22 (×4): 60 mg via ORAL
  Filled 2018-07-18 (×3): qty 1
  Filled 2018-07-18 (×2): qty 14
  Filled 2018-07-18 (×2): qty 1
  Filled 2018-07-18: qty 14

## 2018-07-18 MED ORDER — LORAZEPAM 1 MG PO TABS
1.0000 mg | ORAL_TABLET | Freq: Four times a day (QID) | ORAL | Status: AC
  Administered 2018-07-18 (×4): 1 mg via ORAL
  Filled 2018-07-18 (×4): qty 1

## 2018-07-18 MED ORDER — HYDROXYZINE HCL 50 MG PO TABS
50.0000 mg | ORAL_TABLET | Freq: Four times a day (QID) | ORAL | Status: DC | PRN
  Filled 2018-07-18: qty 10

## 2018-07-18 MED ORDER — OLANZAPINE 10 MG PO TABS
20.0000 mg | ORAL_TABLET | Freq: Every day | ORAL | Status: DC
  Administered 2018-07-18 – 2018-07-22 (×5): 20 mg via ORAL
  Filled 2018-07-18: qty 28
  Filled 2018-07-18 (×4): qty 2
  Filled 2018-07-18: qty 28
  Filled 2018-07-18 (×2): qty 2
  Filled 2018-07-18 (×2): qty 28

## 2018-07-18 MED ORDER — LORAZEPAM 1 MG PO TABS
1.0000 mg | ORAL_TABLET | Freq: Every day | ORAL | Status: AC
  Administered 2018-07-21: 1 mg via ORAL
  Filled 2018-07-18: qty 1

## 2018-07-18 MED ORDER — LORAZEPAM 1 MG PO TABS
1.0000 mg | ORAL_TABLET | Freq: Three times a day (TID) | ORAL | Status: AC
  Administered 2018-07-19 (×3): 1 mg via ORAL
  Filled 2018-07-18 (×3): qty 1

## 2018-07-18 MED ORDER — LORAZEPAM 1 MG PO TABS
1.0000 mg | ORAL_TABLET | Freq: Two times a day (BID) | ORAL | Status: AC
  Administered 2018-07-20 (×2): 1 mg via ORAL
  Filled 2018-07-18 (×2): qty 1

## 2018-07-18 NOTE — Plan of Care (Signed)
D: Patient presents depressed and in withdrawal. Patient is scoring 5 and 6 on CIWA today, and is receiving Ativan taper. He reports his sleep is poor, and he received medication that was not helpful. His appetite is poor, energy low and concentration poor. He rates his depression and anxiety 8/10 and sense of hopelessness 7/10. He reports tremors, chills, agitation, runny nose, and irritability. He has occasional Si, no plan or intent. He has a headache and c/o right knee pain 6/10. Patient denies HI/AVH.  A: Administered APAP for pain. Patient checked q15 min, and checks reviewed. Reviewed medication changes with patient and educated on side effects. Educated patient on importance of attending group therapy sessions and educated on several coping skills. Encouarged participation in milieu through recreation therapy and attending meals with peers. Support and encouragement provided. Fluids offered. R: Patient receptive to education on medications, and is medication compliant. Patient contracts for safety on the unit. Goal to work on "Why I feel suicidal and how can I get back to a level of confidence to deal with my feelings" and "journal, go to classes, pray, try and take it easy" and "about my meds and tweeking my meds"
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
## 2018-07-18 NOTE — [facility] Group Notes (Signed)
LCSW Group Therapy Note 07/18/2018 1:44 PM  Type of Therapy and Topic: Group Therapy: Avoiding Self-Sabotaging and Enabling Behaviors  Participation Level: Did Not Attend  Description of Group:  In this group, patients will learn how to identify obstacles, self-sabotaging and enabling behaviors, as well as: what are they, why do we do them and what needs these behaviors meet. Discuss unhealthy relationships and how to have positive healthy boundaries with those that sabotage and enable. Explore aspects of self-sabotage and enabling in yourself and how to limit these self-destructive behaviors in everyday life.  Therapeutic Goals: 1. Patient will identify one obstacle that relates to self-sabotage and enabling behaviors 2. Patient will identify one personal self-sabotaging or enabling behavior they did prior to admission 3. Patient will state a plan to change the above identified behavior 4. Patient will demonstrate ability to communicate their needs through discussion and/or role play.   Summary of Patient Progress:  Invited, chose not to attend.     Therapeutic Modalities:  Cognitive Behavioral Therapy Person-Centered Therapy Motivational Interviewing   Baldo DaubJolan Braxtin Bamba LCSWA Clinical Social Worker

## 2018-07-18 NOTE — [facility] Suicide Risk Assessment (Signed)
[facility] INPATIENT:  Family/Significant Other Suicide Prevention Education  Suicide Prevention Education:  Patient Refusal for Family/Significant Other Suicide Prevention Education: The patient Donald Carroll has refused to provide written consent for family/significant other to be provided Family/Significant Other Suicide Prevention Education during admission and/or prior to discharge.  Physician notified.  SPE completed with pt, as pt refused to consent to family contact. SPI pamphlet provided to pt and pt was encouraged to share information with support network, ask questions, and talk about any concerns relating to SPE. Pt denies access to guns/firearms and verbalized understanding of information provided. Mobile Crisis information also provided to pt.   Rona Ravens LCSW 07/18/2018, 10:35 AM

## 2018-07-18 NOTE — [facility] Group Notes (Signed)
[facility] Group Notes:  (Nursing/MHT/Case Management/Adjunct)  Date:  07/18/2018  Time:  5:58 PM  Type of Therapy:  Psychoeducational Skills  Participation Level:  Active  Participation Quality:  Appropriate  Affect:  Appropriate  Cognitive:  Appropriate  Insight:  Appropriate  Engagement in Group:  Engaged  Modes of Intervention:  Problem-solving  Bethann Punches 07/18/2018, 5:58 PM
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
## 2018-07-18 NOTE — Therapy (Signed)
Occupational Therapy Group Note  Date:  07/18/2018 Time:  2:51 PM  Group Topic/Focus:  Stress Management  Participation Level:  Active  Participation Quality:  Appropriate  Affect:  Appropriate  Cognitive:  Appropriate  Insight: Improving  Engagement in Group:  Engaged  Modes of Intervention:  Activity, Discussion, Education and Socialization  Additional Comments:   S: "I know its never helpful to blame and complain and have negativity"  O: Education given on stress management. Stress symptom checklist completed, pt asked to identify areas of struggle. Stress management tools worksheet used to facilitate discussion of positive vs. Negative coping mechanisms. Pt asked to share any personal experiences with coping mechanisms.  A: Pt presents to group with appropriate affect, pleasant and participatory. Pt completed symptom checklist- scoring very high. Pt in understanding of stress management tools worksheet this date, willing to try relaxation methods suggested and gratitude journaling.  P: Handouts given to facilitate carryover into community. OT will continue to follow up while inpatient.  Dalphine Handing, MSOT, OTR/L Behavioral Health OT/ Acute Relief OT   Dalphine Handing 07/18/2018, 2:51 PM

## 2018-07-18 NOTE — Tx Team (Signed)
Interdisciplinary Treatment and Diagnostic Plan Update  07/18/2018 Time of Session: 0830AM Donald Carroll MRN: [id number]  Principal Diagnosis: MDD, recurrent, severe  Secondary Diagnoses: Active Problems:   MDD (major depressive disorder), recurrent episode, severe (HCC)   Current Medications:  Current Facility-Administered Medications  Medication Dose Route Frequency Provider Last Rate Last Dose  . acetaminophen (TYLENOL) tablet 650 mg  650 mg Oral Q6H PRN Nanci Pina, FNP   650 mg at 07/18/18 0806  . alum & mag hydroxide-simeth (MAALOX/MYLANTA) 200-200-20 MG/5ML suspension 30 mL  30 mL Oral Q4H PRN Nanci Pina, FNP      . DULoxetine (CYMBALTA) DR capsule 90 mg  90 mg Oral Daily Nanci Pina, FNP   90 mg at 07/18/18 7579  . gabapentin (NEURONTIN) capsule 400 mg  400 mg Oral QID Nanci Pina, FNP   400 mg at 07/18/18 0806  . Influenza vac split quadrivalent PF (FLUARIX) injection 0.5 mL  0.5 mL Intramuscular Tomorrow-1000 [name], MD      . lisinopril (PRINIVIL,ZESTRIL) tablet 40 mg  40 mg Oral Daily Nanci Pina, FNP   40 mg at 07/18/18 0807  . LORazepam (ATIVAN) tablet 1 mg  1 mg Oral QID [name], MD       Followed by  . [START ON 07/19/2018] LORazepam (ATIVAN) tablet 1 mg  1 mg Oral TID [name], MD       Followed by  . [START ON 07/20/2018] LORazepam (ATIVAN) tablet 1 mg  1 mg Oral BID [name], MD       Followed by  . [START ON 07/21/2018] LORazepam (ATIVAN) tablet 1 mg  1 mg Oral Daily Clary, Cordie Grice, MD      . magnesium hydroxide (MILK OF MAGNESIA) suspension 30 mL  30 mL Oral Daily PRN Starkes, Takia S, FNP      . nicotine (NICODERM CQ - dosed in mg/24 hours) patch 21 mg  21 mg Transdermal Daily [name], MD   21 mg at 07/18/18 0806  . pneumococcal 23 valent vaccine (PNU-IMMUNE) injection 0.5 mL  0.5 mL Intramuscular Tomorrow-1000 [name], MD      . thiamine (VITAMIN B-1) tablet 100 mg  100  mg Oral Daily Priscille Loveless S, FNP   100 mg at 07/18/18 7282   Or  . thiamine (B-1) injection 100 mg  100 mg Intravenous Daily Starkes, Takia S, FNP      . traZODone (DESYREL) tablet 150 mg  150 mg Oral QHS Nanci Pina, FNP   150 mg at 07/17/18 2110   PTA Medications: Medications Prior to Admission  Medication Sig Dispense Refill Last Dose  . DULoxetine (CYMBALTA) 30 MG capsule Take 90 mg by mouth daily.   Past Week at Unknown time  . gabapentin (NEURONTIN) 400 MG capsule Take 400 mg by mouth 4 (four) times daily.   Past Week at Unknown time  . lisinopril (PRINIVIL,ZESTRIL) 40 MG tablet Take 40 mg by mouth daily.   Past Week at Unknown time  . naproxen sodium (ALEVE) 220 MG tablet Take 220 mg by mouth daily as needed (pain).   07/15/2018 at Unknown time  . OLANZapine (ZYPREXA) 15 MG tablet Take 15 mg by mouth at bedtime.   Past Week at Unknown time  . traZODone (DESYREL) 150 MG tablet Take 150 mg by mouth at bedtime.   Past Week at Unknown time    Patient Stressors: Marital or family conflict  Substance abuse  Patient Strengths: Ability for insight Average or above average intelligence Capable of independent living General fund of knowledge Motivation for treatment/growth  Treatment Modalities: Medication Management, Group therapy, Case management,  1 to 1 session with clinician, Psychoeducation, Recreational therapy.   Physician Treatment Plan for Primary Diagnosis: MDD, recurrent, severe  Medication Management: Evaluate patient's response, side effects, and tolerance of medication regimen.  Therapeutic Interventions: 1 to 1 sessions, Unit Group sessions and Medication administration.  Evaluation of Outcomes: Not Met  Physician Treatment Plan for Secondary Diagnosis: Active Problems:   MDD (major depressive disorder), recurrent episode, severe (HCC)     Medication Management: Evaluate patient's response, side effects, and tolerance of medication regimen.  Therapeutic  Interventions: 1 to 1 sessions, Unit Group sessions and Medication administration.  Evaluation of Outcomes: Not Met   RN Treatment Plan for Primary Diagnosis: MDD, recurrent, severe Long Term Goal(s): Knowledge of disease and therapeutic regimen to maintain health will improve  Short Term Goals: Ability to remain free from injury will improve, Ability to demonstrate self-control, Ability to verbalize feelings will improve and Ability to disclose and discuss suicidal ideas  Medication Management: RN will administer medications as ordered by provider, will assess and evaluate patient's response and provide education to patient for prescribed medication. RN will report any adverse and/or side effects to prescribing provider.  Therapeutic Interventions: 1 on 1 counseling sessions, Psychoeducation, Medication administration, Evaluate responses to treatment, Monitor vital signs and CBGs as ordered, Perform/monitor CIWA, COWS, AIMS and Fall Risk screenings as ordered, Perform wound care treatments as ordered.  Evaluation of Outcomes: Not Met   LCSW Treatment Plan for Primary Diagnosis: MDD, recurrent, severe Long Term Goal(s): Safe transition to appropriate next level of care at discharge, Engage patient in therapeutic group addressing interpersonal concerns.  Short Term Goals: Engage patient in aftercare planning with referrals and resources, Facilitate patient progression through stages of change regarding substance use diagnoses and concerns and Identify triggers associated with mental health/substance abuse issues  Therapeutic Interventions: Assess for all discharge needs, 1 to 1 time with Social worker, Explore available resources and support systems, Assess for adequacy in community support network, Educate family and significant other(s) on suicide prevention, Complete Psychosocial Assessment, Interpersonal group therapy.  Evaluation of Outcomes: Not Met   Progress in Treatment: Attending  groups: No. New to unit. Continuing to assess.  Participating in groups: No. Taking medication as prescribed: Yes. Toleration medication: Yes. Family/Significant other contact made: No, will contact:  family member if pt consents to collateral contact.  Patient understands diagnosis: Yes. Discussing patient identified problems/goals with staff: Yes. Medical problems stabilized or resolved: Yes. Denies suicidal/homicidal ideation: Yes. Issues/concerns per patient self-inventory: No. Other: n/a   New problem(s) identified: No, Describe:  n/a  New Short Term/Long Term Goal(s): detox, medication management for mood stabilization; elimination of SI thoughts; development of comprehensive mental wellness/sobriety plan.   Patient Goals:  "Help with my sobriety and mental health."   Discharge Plan or Barriers: CSW assessing for appropriate referrals. [facility] pamphlet, Mobile Crisis information, and AA/NA information provided to patient for additional community support and resources.   Reason for Continuation of Hospitalization: Anxiety Depression Medication stabilization Suicidal ideation Withdrawal symptoms  Estimated Length of Stay: Monday, 07/23/18  Attendees: Patient: 07/18/2018 9:11 AM  Physician: Dr. Mallie Darting MD; Dr. Nancy Fetter MD 07/18/2018 9:11 AM  Nursing: Roderic Palau RN 07/18/2018 9:11 AM  RN Care Manager:x 07/18/2018 9:11 AM  Social Worker: Janice Norrie LCSW 07/18/2018 9:11 AM  Recreational Therapist:  x 07/18/2018 9:11 AM  Other: Lindell Spar NP 07/18/2018 9:11 AM  Other:  07/18/2018 9:11 AM  Other: 07/18/2018 9:11 AM    Scribe for Treatment Team: Avelina Laine, LCSW 07/18/2018 9:11 AM

## 2018-07-18 NOTE — BHH Suicide Risk Assessment (Signed)
[facility] Admission Suicide Risk Assessment   Nursing information obtained from:  Patient Demographic factors:  Male, Caucasian, Low socioeconomic status, Living alone Current Mental Status:  Suicidal ideation indicated by patient, Self-harm thoughts, Intention to act on suicide plan Loss Factors:  Loss of significant relationship Historical Factors:  Prior suicide attempts, Family history of mental illness or substance abuse Risk Reduction Factors:  Positive coping skills or problem solving skills  Total Time spent with patient: 1 hour Principal Problem: Bipolar I disorder, current or most recent episode depressed, with psychotic features (HCC) Diagnosis:   Patient Active Problem List   Diagnosis Date Noted  . PTSD (post-traumatic stress disorder) [F43.10] 07/18/2018  . Alcohol use disorder, severe, dependence (HCC) [F10.20] 07/18/2018  . Bipolar I disorder, current or most recent episode depressed, with psychotic features (HCC) [F31.5] 07/17/2018   Subjective Data: see H&P  Continued Clinical Symptoms:  Alcohol Use Disorder Identification Test Final Score (AUDIT): 22 The "Alcohol Use Disorders Identification Test", Guidelines for Use in Primary Care, Second Edition.  World Science writer [employer]). Score between 0-7:  no or low risk or alcohol related problems. Score between 8-15:  moderate risk of alcohol related problems. Score between 16-19:  high risk of alcohol related problems. Score 20 or above:  warrants further diagnostic evaluation for alcohol   Psychiatric Specialty Exam: Physical Exam  Nursing note and vitals reviewed.     Blood pressure 117/89, pulse 93, temperature 98.9 F (37.2 C), temperature source Oral, resp. rate 16, height 5\' 9"  (1.753 m), weight 82.1 kg.Body mass index is 26.73 kg/m.   COGNITIVE FEATURES THAT CONTRIBUTE TO RISK:  None    SUICIDE RISK:   Extreme:  Frequent, intense, and enduring suicidal ideation, specific plans, clear subjective and  objective intent, impaired self-control, severe dysphoria/symptomatology, many risk factors and no protective factors.  PLAN OF CARE: see H&P  I certify that inpatient services furnished can reasonably be expected to improve the patient's condition.   Donald Likens, MD 07/18/2018, 3:45 PM

## 2018-07-19 NOTE — BHH Group Notes (Signed)
LCSW Group Therapy Note   07/19/2018 1:15pm   Type of Therapy and Topic:  Group Therapy:  Overcoming Obstacles   Participation Level:  Active   Description of Group:    In this group patients will be encouraged to explore what they see as obstacles to their own wellness and recovery. They will be guided to discuss their thoughts, feelings, and behaviors related to these obstacles. The group will process together ways to cope with barriers, with attention given to specific choices patients can make. Each patient will be challenged to identify changes they are motivated to make in order to overcome their obstacles. This group will be process-oriented, with patients participating in exploration of their own experiences as well as giving and receiving support and challenge from other group members.   Therapeutic Goals: 1. Patient will identify personal and current obstacles as they relate to admission. 2. Patient will identify barriers that currently interfere with their wellness or overcoming obstacles.  3. Patient will identify feelings, thought process and behaviors related to these barriers. 4. Patient will identify two changes they are willing to make to overcome these obstacles:      Summary of Patient Progress   Shion was attentive and engaged during today's processing group. He shared that he is hoping to work on his depression and substance abuse. "I'm hoping to learn better coping skills when I get to [facility]." Zacory continues to show progress in the group setting with improving insight.    Therapeutic Modalities:   Cognitive Behavioral Therapy Solution Focused Therapy Motivational Interviewing Relapse Prevention Therapy  Rona Ravens, LCSW 07/19/2018 3:10 PM

## 2018-07-19 NOTE — Progress Notes (Signed)
1658 pt was given Ibuprofen for right knee pain. Pt denies SI/HI and AVH. Pt verbally contracts for safety.
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
## 2018-07-19 NOTE — Progress Notes (Signed)
D: Pt requested medication to help with sleep, did not find the sleep medications helpful, only slept fairly. Pt's appetite is fair and concentration is poor. Pt's depression is 8/10, hopelessness 7/10, and anxiety is 8/10. Pt is withdrawing from alcohol and is experiencing tremors, cravings, agitation, runny nose, chilling, and irritability. Pt is having some SI. Pt is experiencing lightheadedness and pain rated 6/10 in right knee. Pt states pain medicine is helpful. Pt's doesn't state a specific goal but plans to accomplish them by working with others.  A: Schedule medications administered to pt, per MD orders. Support and encouragement provided. Routine safety checks performed every 15 minutes. Pt contract for safety. Frequent verbal contact made.  R: No adverse drug reactions noted. Pt contracts for safety at this time. Pt complaint with medication and treatment plan. Pt receptive and cooperative. Pt interacts well with others on the unit. Pt remains safe at this time.

## 2018-07-19 NOTE — Progress Notes (Signed)
Patient ID: Donald Carroll, male   DOB: 1965-10-28, 52 y.o.   MRN: [id number]   D: Patient pleasant on approach tonight. Reports his mood is much better tonight because his children came to visit. Still continues on the ativan protocol. Reports some tremors, sweats, and nausea at times. CIWA=3. Contracts for safety on the unit. A: Staff will continue to monitor on q 15 minute checks, follow treatment plan, and give medications as needed. R: Cooperative on the unit.

## 2018-07-19 NOTE — Progress Notes (Signed)
[facility] MD Progress Note  07/19/2018 12:37 PM Donald Carroll  MRN:  [id number]  Subjective: Donald Carroll reports, "I snorted a lot of heroin last Monday to take my own life. I felt like I did not want to live any more because my life was not going well. I got super depressed. I'm trying very hard to stay well. My medications are being adjusted. I plan on going into a substance abuse treatment program after discharge".  Donald Carroll is a 52 y/o M with history of PTSD, bipolar, and substance use who was admitted voluntarily from [place] where he presented initially brought in by EMS after he was found unresponsive in the community at a bus stop and was revived with narcan after overdose of heroin in a suicide attempt. Pt later confirmed he was attempting suicide in ED and he was voluntary for admission to [facility]. He was medically cleared and then transferred to [facility] for additional treatment and stabilization. Upon initial interview, pt shares, "I was feeling suicidal, and I had a suicide plan. I saw my therapist at [facility], and she said to go to the hospital. Well on the way to the hospital I met this guy at the bus stop and he had some heroin, so I figured that'd be a quicker way to go, so I did any overdose - like enough to kill three people, and then I guess they found me and brought me in." Pt reports he is still feeling suicidal but he is able to contract for safety while in the hospital. He shares that his mood has been depressed for the last month severely related to separating from his wife due to her increasing use of alcohol. Pt reports that he then relapsed on alcohol and has been having increasing use over the past 4 weeks. He has poor sleep with 1.5 hours per night,   Donald Carroll is seen, chart reviewed. The chart findings discussed with the treatment. He presents alert, oriented x 4. He is visible on the unit, attending group sessions & activities being held in this hospital. He says he was brought to the hospital  because he took an overdose of heroin to kill himself. He says he did that because he was fed-up with life & it's struggles. Apparently, someone saw him lying on the ground at the bus depot & called for help. He continues to endorse symptoms of depression & passive suicidal thoughts. He is able to verbally contract for safety. He denies any SIHI, AVH, delusional thoughts or paranoia. He does not appear to be responding to any internal stimuli. He hopes to get into a long term substance abuse treatment program after discharge. Donald Carroll has agreed to continue current plan of care already in progress.  Principal Problem: Bipolar I disorder, current or most recent episode depressed, with psychotic features [facility])  Diagnosis:   Patient Active Problem List   Diagnosis Date Noted  . PTSD (post-traumatic stress disorder) [F43.10] 07/18/2018  . Alcohol use disorder, severe, dependence ([place]) [F10.20] 07/18/2018  . Bipolar I disorder, current or most recent episode depressed, with psychotic features ([place]) [F31.5] 07/17/2018   Total Time spent with patient: 25 minutes  Past Psychiatric History: See H&P  Past Medical History:  Past Medical History:  Diagnosis Date  . Anxiety   . Depression   . Hypertension    History reviewed. No pertinent surgical history.  Family History: History reviewed. No pertinent family history.  Family Psychiatric  History: See H&P.  Social History:  Social  History   Substance and Sexual Activity  Alcohol Use Yes   Comment: drinking daily past month     Social History   Substance and Sexual Activity  Drug Use Yes  . Types: Heroin    Social History   Socioeconomic History  . Marital status: Single    Spouse name: Not on file  . Number of children: Not on file  . Years of education: Not on file  . Highest education level: Not on file  Occupational History  . Not on file  Social Needs  . Financial resource strain: Not on file  . Food insecurity:    Worry:  Not on file    Inability: Not on file  . Transportation needs:    Medical: Not on file    Non-medical: Not on file  Tobacco Use  . Smoking status: Current Every Day Smoker    Packs/day: 1.00    Types: Cigarettes  . Smokeless tobacco: Never Used  Substance and Sexual Activity  . Alcohol use: Yes    Comment: drinking daily past month  . Drug use: Yes    Types: Heroin  . Sexual activity: Not Currently  Lifestyle  . Physical activity:    Days per week: Not on file    Minutes per session: Not on file  . Stress: Not on file  Relationships  . Social connections:    Talks on phone: Not on file    Gets together: Not on file    Attends religious service: Not on file    Active member of club or organization: Not on file    Attends meetings of clubs or organizations: Not on file    Relationship status: Not on file  Other Topics Concern  . Not on file  Social History Narrative  . Not on file   Additional Social History:   Sleep: Fair  Appetite:  Good  Current Medications: Current Facility-Administered Medications  Medication Dose Route Frequency Provider Last Rate Last Dose  . acetaminophen (TYLENOL) tablet 650 mg  650 mg Oral Q6H PRN Nanci Pina, FNP   650 mg at 07/18/18 0806  . alum & mag hydroxide-simeth (MAALOX/MYLANTA) 200-200-20 MG/5ML suspension 30 mL  30 mL Oral Q4H PRN Nanci Pina, FNP      . DULoxetine (CYMBALTA) DR capsule 60 mg  60 mg Oral Daily Pennelope Bracken, MD   60 mg at 07/19/18 0755  . gabapentin (NEURONTIN) capsule 400 mg  400 mg Oral QID Priscille Loveless S, FNP   400 mg at 07/19/18 1207  . hydrOXYzine (ATARAX/VISTARIL) tablet 50 mg  50 mg Oral Q6H PRN Pennelope Bracken, MD      . lisinopril (PRINIVIL,ZESTRIL) tablet 40 mg  40 mg Oral Daily Nanci Pina, FNP   40 mg at 07/19/18 0754  . LORazepam (ATIVAN) tablet 1 mg  1 mg Oral TID [name], MD   1 mg at 07/19/18 1207   Followed by  . [START ON 07/20/2018] LORazepam  (ATIVAN) tablet 1 mg  1 mg Oral BID [name], MD       Followed by  . [START ON 07/21/2018] LORazepam (ATIVAN) tablet 1 mg  1 mg Oral Daily Clary, Cordie Grice, MD      . magnesium hydroxide (MILK OF MAGNESIA) suspension 30 mL  30 mL Oral Daily PRN Starkes, Takia S, FNP      . nicotine (NICODERM CQ - dosed in mg/24 hours) patch 21 mg  21  mg Transdermal Daily [name], MD   21 mg at 07/19/18 0754  . OLANZapine (ZYPREXA) tablet 20 mg  20 mg Oral QHS Pennelope Bracken, MD   20 mg at 07/18/18 2116  . thiamine (VITAMIN B-1) tablet 100 mg  100 mg Oral Daily Nanci Pina, FNP   100 mg at 07/19/18 5621   Or  . thiamine (B-1) injection 100 mg  100 mg Intravenous Daily Starkes, Takia S, FNP      . traZODone (DESYREL) tablet 200 mg  200 mg Oral QHS Pennelope Bracken, MD   200 mg at 07/18/18 2116    Lab Results:  Results for orders placed or performed during the hospital encounter of 07/17/18 (from the past 48 hour(s))  Hemoglobin A1c     Status: Abnormal   Collection Time: 07/18/18  6:22 AM  Result Value Ref Range   Hgb A1c MFr Bld 4.6 (L) 4.8 - 5.6 %    Comment: (NOTE) Pre diabetes:          5.7%-6.4% Diabetes:              >6.4% Glycemic control for   <7.0% adults with diabetes    Mean Plasma Glucose 85.32 mg/dL    Comment: Performed at [facility], [place] [address] [place], [place] [address]  Lipid panel     Status: Abnormal   Collection Time: 07/18/18  6:22 AM  Result Value Ref Range   Cholesterol 179 0 - 200 mg/dL   Triglycerides 359 (H) <150 mg/dL   HDL 37 (L) >40 mg/dL   Total CHOL/HDL Ratio 4.8 RATIO   VLDL 72 (H) 0 - 40 mg/dL   LDL Cholesterol 70 0 - 99 mg/dL    Comment:        Total Cholesterol/HDL:CHD Risk Coronary Heart Disease Risk Table                     Men   Women  1/2 Average Risk   3.4   3.3  Average Risk       5.0   4.4  2 X Average Risk   9.6   7.1  3 X Average Risk  23.4   11.0        Use the calculated Patient  Ratio above and the CHD Risk Table to determine the patient's CHD Risk.        ATP III CLASSIFICATION (LDL):  <100     mg/dL   Optimal  100-129  mg/dL   Near or Above                    Optimal  130-159  mg/dL   Borderline  160-189  mg/dL   High  >190     mg/dL   Very High Performed at [facility] [address] [place], [place] [address]   TSH     Status: None   Collection Time: 07/18/18  6:22 AM  Result Value Ref Range   TSH 3.876 0.350 - 4.500 uIU/mL    Comment: Performed by a 3rd Generation assay with a functional sensitivity of <=0.01 uIU/mL. Performed at [facility], [place] [address] [place], [place] [address]    Blood Alcohol level:  Lab Results  Component Value Date   ETH 173 (H) 84/13/2440    Metabolic Disorder Labs: Lab Results  Component Value Date   HGBA1C 4.6 (L) 07/18/2018   MPG 85.32  07/18/2018   No results found for: PROLACTIN Lab Results  Component Value Date   CHOL 179 07/18/2018   TRIG 359 (H) 07/18/2018   HDL 37 (L) 07/18/2018   CHOLHDL 4.8 07/18/2018   VLDL 72 (H) 07/18/2018   LDLCALC 70 07/18/2018    Physical Findings: AIMS: Facial and Oral Movements Muscles of Facial Expression: None, normal Lips and Perioral Area: None, normal Jaw: None, normal Tongue: None, normal,Extremity Movements Upper (arms, wrists, hands, fingers): None, normal Lower (legs, knees, ankles, toes): None, normal, Trunk Movements Neck, shoulders, hips: None, normal, Overall Severity Severity of abnormal movements (highest score from questions above): None, normal Incapacitation due to abnormal movements: None, normal Patient's awareness of abnormal movements (rate only patient's report): No Awareness, Dental Status Current problems with teeth and/or dentures?: No Does patient usually wear dentures?: No  CIWA:  CIWA-Ar Total: 3 COWS:  COWS Total Score: 7  Musculoskeletal: Strength & Muscle Tone: within normal limits Gait &  Station: normal Patient leans: N/A  Psychiatric Specialty Exam: Physical Exam  Nursing note and vitals reviewed.   Review of Systems  Psychiatric/Behavioral: Positive for depression and substance abuse (Hx. Cocaine & alcohol use disorder). Negative for memory loss and suicidal ideas. Hallucinations:  hx. paranoid ideations. The patient is nervous/anxious and has insomnia.     Blood pressure 123/86, pulse (!) 113, temperature 99 F (37.2 C), resp. rate 16, height 5' 9"  (1.753 m), weight 82.1 kg.Body mass index is 26.73 kg/m.  General Appearance: Casual and Disheveled  Eye Contact:  Good  Speech:  Clear and Coherent and Normal Rate  Volume:  Normal  Mood:  Anxious and Depressed  Affect:  Appropriate, Congruent, Constricted and Depressed  Thought Process:  Coherent and Goal Directed  Orientation:  Full (Time, Place, and Person)  Thought Content:  Logical  Suicidal Thoughts:  Yes.  without intent/plan  Homicidal Thoughts:  No  Memory:  Immediate;   Fair Recent;   Fair Remote;   Fair  Judgement:  Poor  Insight:  Lacking  Psychomotor Activity:  Normal  Concentration:  Concentration: Fair  Recall:  [employer] of Knowledge:  Fair  Language:  Fair  Akathisia:  No  Handed:    AIMS (if indicated):     Assets:  Resilience Social Support  ADL's:  Intact  Cognition:  WNL  Sleep:  Number of Hours: 6.25     Treatment Plan Summary: Daily contact with patient to assess and evaluate symptoms and progress in treatment.  - Continue inpatient hospitalization.  - Will continue today 07/19/2018 plan as below except where it is noted.  Alcohol detox.     - Continue the Ativan detoxification regimen as recommended.  Mood control.     - Continue Olanzapine 20 mg po Q bedtime.  Agitation.     - Continue Gabapentin 400 mg po 4 times daily.  Depression.     - Continue Cymbalta 60 mg po daily.  Insomnia.  Patient to continue to attend & participate in the group counseling  sessions.  Discharge disposition plan is ongoing.     - Continue Trazodone 200 mg po Q hs .  Other medical issues.    HTN: Continue Lisinopril 40 mg po daily.  Lindell Spar, NP, PMHNP, FNP-BC 07/19/2018, 12:37 PM

## 2018-07-19 NOTE — Progress Notes (Signed)
Frequent verbal contact was made throughout my shift with the pt. Pt currently denies SI/HI and AVH. Pt presents as hopeful with brighter affect. Pt states "having someone check in with him frequently is very helpful."

## 2018-07-20 MED ORDER — OXCARBAZEPINE 300 MG PO TABS
300.0000 mg | ORAL_TABLET | Freq: Two times a day (BID) | ORAL | Status: DC
  Administered 2018-07-20 – 2018-07-22 (×5): 300 mg via ORAL
  Filled 2018-07-20 (×3): qty 1
  Filled 2018-07-20 (×4): qty 28
  Filled 2018-07-20: qty 1
  Filled 2018-07-20: qty 28
  Filled 2018-07-20 (×2): qty 1
  Filled 2018-07-20: qty 28

## 2018-07-20 NOTE — [facility] Group Notes (Signed)
[facility] Group Notes:  (Nursing/MHT/Case Management/Adjunct)  Date:  07/20/2018  Time:  4:00 PM  Type of Therapy:  Nurse Education  Participation Level:  Active  Participation Quality:  Appropriate, Attentive, Sharing and Supportive  Affect:  Appropriate  Cognitive:  Alert and Appropriate  Insight:  Appropriate and Improving  Engagement in Group:  Developing/Improving, Engaged and Supportive  Modes of Intervention:  Discussion and Support  Summary of Progress/Problems: Patient was supportive and had very good insight.  Dewayne Shorter 07/20/2018, 6:06 PM

## 2018-07-20 NOTE — Progress Notes (Signed)
Recreation Therapy Notes  Date: 9.27.19 Time: 0930 Location: [address] Hall Dayroom  Group Topic: Stress Management  Goal Area(s) Addresses:  Patient will verbalize importance of using healthy stress management.  Patient will identify positive emotions associated with healthy stress management.   Intervention: Stress Management  Activity :  Meditation.  LRT introduced the stress management technique of meditation.  LRT played a meditation that focused on being resilient in the face of obstacles.  Patients were to follow along as meditation played.  Education:  Stress Management, Discharge Planning.   Education Outcome: Acknowledges edcuation/In group clarification offered/Needs additional education  Clinical Observations/Feedback: Pt did not attend group.      Raynee Mccasland, LRT/CTRS         Suhana Wilner A 07/20/2018 12:58 PM 

## 2018-07-20 NOTE — Progress Notes (Signed)
Patient attended AA group meeting.  

## 2018-07-20 NOTE — Progress Notes (Addendum)
[facility] MD Progress Note  07/20/2018 12:55 PM Donald Carroll  MRN:  [id number]  Subjective: Donald Carroll reports, "I'm feeling a little better today. I slept for about 4 hours last night, better than I have ever slept. My biggest problem now is racing thoughts. My mind will not shut off ant any time, more so at night. Racing thoughts has always been there for me. It has not gotten any better. The is the reason that I'm unable to sleep. It is due to racing thoughts that I think about something to use to help not think, think & think. It is because of racing thoughts that I have constant thoughts of harming myself. I don't know what is going wrong in my mind. It is like my mind has no break pad. I need help".  Donald Carroll is a 52 y/o M with history of PTSD, bipolar, and substance use who was admitted voluntarily from [place] where he presented initially brought in by EMS after he was found unresponsive in the community at a bus stop and was revived with narcan after overdose of heroin in a suicide attempt. Pt later confirmed he was attempting suicide in ED and he was voluntary for admission to [facility]. He was medically cleared and then transferred to [facility] for additional treatment and stabilization. Upon initial interview, pt shares, "I was feeling suicidal, and I had a suicide plan. I saw my therapist at [facility], and she said to go to the hospital. Well on the way to the hospital I met this guy at the bus stop and he had some heroin, so I figured that'd be a quicker way to go, so I did any overdose - like enough to kill three people, and then I guess they found me and brought me in." Pt reports he is still feeling suicidal but he is able to contract for safety while in the hospital. He shares that his mood has been depressed for the last month severely related to separating from his wife due to her increasing use of alcohol. Pt reports that he then relapsed on alcohol and has been having increasing use over the past 4 weeks. He  has poor sleep with 1.5 hours per night,   Donald Carroll is seen, chart reviewed. The chart findings discussed with the treatment. He presents alert, oriented x 4. He is visible on the unit, attending group sessions & activities being held in this hospital. He says he was brought to the hospital because he took an overdose of heroin to kill himself. He says he did that because he was fed-up with life & it's struggles. He is complaining of having racing thoughts today. He says his mind does not shut off at night. This is the reason he could not sleep well. He adds that it is because of the racing thoughts that he is constantly thinking about suicide & ways to harm himself. He continues to endorse symptoms of depression & passive suicidal thoughts. He is able to verbally contract for safety. He denies any SIHI, AVH, delusional thoughts or paranoia. He does not appear to be responding to any internal stimuli. He hopes to get into a long term substance abuse treatment program after discharge. Donald Carroll has agreed to continue current plan of care already in progress with initiation of trileptal 300 mg to help with his racing thoughts.  Principal Problem: Bipolar I disorder, current or most recent episode depressed, with psychotic features [facility])  Diagnosis:   Patient Active Problem List  Diagnosis Date Noted  . PTSD (post-traumatic stress disorder) [F43.10] 07/18/2018  . Alcohol use disorder, severe, dependence ([place]) [F10.20] 07/18/2018  . Bipolar I disorder, current or most recent episode depressed, with psychotic features ([place]) [F31.5] 07/17/2018   Total Time spent with patient: 15 minutes  Past Psychiatric History: See H&P  Past Medical History:  Past Medical History:  Diagnosis Date  . Anxiety   . Depression   . Hypertension    History reviewed. No pertinent surgical history.  Family History: History reviewed. No pertinent family history.  Family Psychiatric  History: See H&P.  Social History:   Social History   Substance and Sexual Activity  Alcohol Use Yes   Comment: drinking daily past month     Social History   Substance and Sexual Activity  Drug Use Yes  . Types: Heroin    Social History   Socioeconomic History  . Marital status: Single    Spouse name: Not on file  . Number of children: Not on file  . Years of education: Not on file  . Highest education level: Not on file  Occupational History  . Not on file  Social Needs  . Financial resource strain: Not on file  . Food insecurity:    Worry: Not on file    Inability: Not on file  . Transportation needs:    Medical: Not on file    Non-medical: Not on file  Tobacco Use  . Smoking status: Current Every Day Smoker    Packs/day: 1.00    Types: Cigarettes  . Smokeless tobacco: Never Used  Substance and Sexual Activity  . Alcohol use: Yes    Comment: drinking daily past month  . Drug use: Yes    Types: Heroin  . Sexual activity: Not Currently  Lifestyle  . Physical activity:    Days per week: Not on file    Minutes per session: Not on file  . Stress: Not on file  Relationships  . Social connections:    Talks on phone: Not on file    Gets together: Not on file    Attends religious service: Not on file    Active member of club or organization: Not on file    Attends meetings of clubs or organizations: Not on file    Relationship status: Not on file  Other Topics Concern  . Not on file  Social History Narrative  . Not on file   Additional Social History:   Sleep: Fair  Appetite:  Good  Current Medications: Current Facility-Administered Medications  Medication Dose Route Frequency Provider Last Rate Last Dose  . acetaminophen (TYLENOL) tablet 650 mg  650 mg Oral Q6H PRN Nanci Pina, FNP   650 mg at 07/19/18 1658  . alum & mag hydroxide-simeth (MAALOX/MYLANTA) 200-200-20 MG/5ML suspension 30 mL  30 mL Oral Q4H PRN Nanci Pina, FNP      . DULoxetine (CYMBALTA) DR capsule 60 mg  60 mg  Oral Daily Pennelope Bracken, MD   60 mg at 07/20/18 0750  . gabapentin (NEURONTIN) capsule 400 mg  400 mg Oral QID Lavina Hamman, Takia S, FNP   400 mg at 07/20/18 1159  . hydrOXYzine (ATARAX/VISTARIL) tablet 50 mg  50 mg Oral Q6H PRN Pennelope Bracken, MD      . lisinopril (PRINIVIL,ZESTRIL) tablet 40 mg  40 mg Oral Daily Nanci Pina, FNP   40 mg at 07/20/18 0750  . LORazepam (ATIVAN) tablet 1 mg  1 mg Oral  BID [name], MD   1 mg at 07/20/18 0750   Followed by  . [START ON 07/21/2018] LORazepam (ATIVAN) tablet 1 mg  1 mg Oral Daily Clary, Cordie Grice, MD      . magnesium hydroxide (MILK OF MAGNESIA) suspension 30 mL  30 mL Oral Daily PRN Starkes, Takia S, FNP      . nicotine (NICODERM CQ - dosed in mg/24 hours) patch 21 mg  21 mg Transdermal Daily [name], MD   21 mg at 07/20/18 0750  . OLANZapine (ZYPREXA) tablet 20 mg  20 mg Oral QHS Pennelope Bracken, MD   20 mg at 07/19/18 2107  . thiamine (VITAMIN B-1) tablet 100 mg  100 mg Oral Daily Priscille Loveless S, FNP   100 mg at 07/20/18 0750   Or  . thiamine (B-1) injection 100 mg  100 mg Intravenous Daily Starkes, Takia S, FNP      . traZODone (DESYREL) tablet 200 mg  200 mg Oral QHS Pennelope Bracken, MD   200 mg at 07/19/18 2107   Lab Results:  No results found for this or any previous visit (from the past 48 hour(s)). Blood Alcohol level:  Lab Results  Component Value Date   ETH 173 (H) 98/08/9146   Metabolic Disorder Labs: Lab Results  Component Value Date   HGBA1C 4.6 (L) 07/18/2018   MPG 85.32 07/18/2018   No results found for: PROLACTIN Lab Results  Component Value Date   CHOL 179 07/18/2018   TRIG 359 (H) 07/18/2018   HDL 37 (L) 07/18/2018   CHOLHDL 4.8 07/18/2018   VLDL 72 (H) 07/18/2018   LDLCALC 70 07/18/2018   Physical Findings: AIMS: Facial and Oral Movements Muscles of Facial Expression: None, normal Lips and Perioral Area: None, normal Jaw: None, normal Tongue:  None, normal,Extremity Movements Upper (arms, wrists, hands, fingers): None, normal Lower (legs, knees, ankles, toes): None, normal, Trunk Movements Neck, shoulders, hips: None, normal, Overall Severity Severity of abnormal movements (highest score from questions above): None, normal Incapacitation due to abnormal movements: None, normal Patient's awareness of abnormal movements (rate only patient's report): No Awareness, Dental Status Current problems with teeth and/or dentures?: No Does patient usually wear dentures?: No  CIWA:  CIWA-Ar Total: 2 COWS:  COWS Total Score: 4  Musculoskeletal: Strength & Muscle Tone: within normal limits Gait & Station: normal Patient leans: N/A  Psychiatric Specialty Exam: Physical Exam  Nursing note and vitals reviewed.   Review of Systems  Psychiatric/Behavioral: Positive for depression and substance abuse (Hx. Cocaine & alcohol use disorder). Negative for memory loss and suicidal ideas. Hallucinations:  hx. paranoid ideations. The patient is nervous/anxious and has insomnia.     Blood pressure (!) 130/110, pulse 93, temperature 98.8 F (37.1 C), resp. rate 16, height 5' 9"  (1.753 m), weight 82.1 kg.Body mass index is 26.73 kg/m.  General Appearance: Casual and Disheveled  Eye Contact:  Good  Speech:  Clear and Coherent and Normal Rate  Volume:  Normal  Mood:  Anxious and Depressed  Affect:  Appropriate, Congruent, Constricted and Depressed  Thought Process:  Coherent and Goal Directed  Orientation:  Full (Time, Place, and Person)  Thought Content:  Logical  Suicidal Thoughts:  Yes.  without intent/plan  Homicidal Thoughts:  No  Memory:  Immediate;   Fair Recent;   Fair Remote;   Fair  Judgement:  Poor  Insight:  Lacking  Psychomotor Activity:  Normal  Concentration:  Concentration: Fair  Recall:  [place] of Knowledge:  Fair  Language:  Fair  Akathisia:  No  Handed:    AIMS (if indicated):     Assets:  Resilience Social Support   ADL's:  Intact  Cognition:  WNL  Sleep:  Number of Hours: 6.25     Treatment Plan Summary: Daily contact with patient to assess and evaluate symptoms and progress in treatment.  - Continue inpatient hospitalization.  - Will continue today 07/20/2018 plan as below except where it is noted.  Alcohol detox.     - Continue the Ativan detoxification regimen as recommended.  Mood control.     - Continue Olanzapine 20 mg po Q bedtime.  Mood stabilization.     - Initiated Trileptal 300 mg po bid.  Agitation.     - Continue Gabapentin 400 mg po 4 times daily.  Depression.     - Continue Cymbalta 60 mg po daily.  Insomnia.  Patient to continue to attend & participate in the group counseling sessions.  Discharge disposition plan is ongoing.     - Continue Trazodone 200 mg po Q hs .  Other medical issues.    HTN: Continue Lisinopril 40 mg po daily.  Lindell Spar, NP, PMHNP, FNP-BC 07/20/2018, 12:55 PMPatient ID: [name], male   DOB: 09/15/1966, 52 y.o.   MRN: [id number]

## 2018-07-20 NOTE — Progress Notes (Signed)
D: Patient observed with visiting family at start of shift. States visit went well. Attended AA group. Patient states "I am slowly improving. I still have times that are hard. I still have on and off thoughts of suicide but I will come to you all. I keep remembering I have to do stuff to distract myself. The racing thoughts are hard though." Patient's affect flat, anxious and depressed with congruent mood. Denies pain, physical complaints.   A: Medicated per orders, no prns requested or needed. Medication education provided. Level III obs in place for safety. Emotional support offered. Patient encouraged to complete Suicide Safety Plan before discharge. Encouraged to attend and participate in unit programming.   R: Patient verbalizes understanding of POC. As previously stated, patient has on and off passive SI. Denies plan, intent and verbally contracts for safety. Patient denies HI/AVH and remains safe on level III obs. Will continue to monitor throughout the night.

## 2018-07-20 NOTE — Plan of Care (Signed)
  Problem: Education: Goal: Knowledge of [place] [facility] Education information/materials will improve Outcome: Progressing Goal: Emotional status will improve Outcome: Progressing Patient reports on and off passive SI however states he is feeling more hopeful as he develops coping skills.

## 2018-07-20 NOTE — Progress Notes (Signed)
D: Patient observed sitting in dayroom much of the evening. Interacting with selective peers. Patient states his day has improved steadily. States, "I've had some suicidal thoughts off and on but I'm learning how to distract myself. When I start to feel that way I come talk to one of you or I color, watch tv. It's really helping. I also talked with my family members tonight. My sons were worried that I tried to take my own life but towards the end of our visit they were coming around, supportive." Patient's affect flat, mood depressed but brightened with interaction and writer's support. Denies pain, physical complaints. CIWA is a "2", COWS "4" and pulse continues to remain elevated. BP stable.  A: Medicated per orders, no prns needed or requested. Medication education provided. Level III obs in place for safety. Emotional support offered. Patient praised for growth towards coping strategy development. Patient encouraged to complete Suicide Safety Plan before discharge. Encouraged to attend and participate in unit programming. Reminded patient staff is here to help him at any time.    R: Patient verbalizes understanding of POC. Patient denies SI at this time and verbally contracts for safety. No HI/AVH and remains safe on level III obs. Will continue to monitor throughout the night.

## 2018-07-20 NOTE — Plan of Care (Signed)
Patient was depressed and anxious upon approach. Patient said he was feeling suicidal, but immediately stated he would seek help if he felt like acting on it. Patient said he slept okay last night. Endorses heavy sleep sweats, anxiety, tremors, and a slight headache. Medications given.  Patient is compliant with medications prescribed per provider. Safety is maintained with 15 minute checks as well as environmental checks. Will continue to monitor.   Problem: Education: Goal: Mental status will improve Outcome: Progressing   Problem: Activity: Goal: Interest or engagement in activities will improve Outcome: Progressing   Problem: Activity: Goal: Sleeping patterns will improve Outcome: Progressing

## 2018-07-21 DIAGNOSIS — I10 Essential (primary) hypertension: Secondary | ICD-10-CM

## 2018-07-21 MED ORDER — ACAMPROSATE CALCIUM 333 MG PO TBEC
666.0000 mg | DELAYED_RELEASE_TABLET | Freq: Three times a day (TID) | ORAL | Status: DC
  Administered 2018-07-21 – 2018-07-23 (×6): 666 mg via ORAL
  Filled 2018-07-21 (×3): qty 84
  Filled 2018-07-21: qty 2
  Filled 2018-07-21: qty 84
  Filled 2018-07-21: qty 2
  Filled 2018-07-21: qty 84
  Filled 2018-07-21: qty 2
  Filled 2018-07-21 (×2): qty 84
  Filled 2018-07-21 (×2): qty 2
  Filled 2018-07-21 (×4): qty 84
  Filled 2018-07-21: qty 2

## 2018-07-21 NOTE — Progress Notes (Signed)
D.  Pt pleasant on approach, denies complaints at this time.  Pt was positive for evening AA group, observed engaged in minimal but appropriate interaction with peers on the unit.  Pt denies SI/HI/AVH at this time.  A.  Support and encouragement offered, medication given as ordered  R.  Pt remains safe on the unit, will continue to monitor.   

## 2018-07-21 NOTE — Progress Notes (Signed)
Patient attended AA group meeting.  

## 2018-07-21 NOTE — Progress Notes (Signed)
Patient was given an extra dose of gabapentin 400 mg during med pass. Patient, NP, and MD made aware. Patient is free of side effects.

## 2018-07-21 NOTE — Progress Notes (Signed)
[facility] MD Progress Note  07/21/2018 9:50 AM [facility]  MRN:  [id number]  Subjective:  Patient reports improving mood and states he is feeling better, less severely depressed . States " today is the first day I have no suicidal thoughts".  He states " I finally got some better sleep last night".  Currently denies medication side effects.   Objective: I have reviewed chart notes and have met with patient. 52 y/o male, separated, lives alone. Admitted after heroin overdose, which he reports was suicidal in intent. Reports he had been struggling with worsening depression. Reports alcohol and cannabis use disorder . States had been drinking heavily over the last month prior to admission. Reports partially improving mood , and describes mood as 4/10, " still not good but it is better than it was ". Denies suicidal ideations , contracts for safety . Future oriented, and states he plans to go to a rehab program at discharge . Behavior on unit in good control, going to some groups . Denies medication side effects. Patient has history of HTN, being managed with Lisinopril. BP remains elevated today ( 135/105). Of note, patient not endorsing residual symptoms of alcohol WDL and does not present with restlessness, diaphoresis or distal tremors at this time.   Principal Problem: Bipolar I disorder, current or most recent episode depressed, with psychotic features [facility])  Diagnosis:   Patient Active Problem List   Diagnosis Date Noted  . PTSD (post-traumatic stress disorder) [F43.10] 07/18/2018  . Alcohol use disorder, severe, dependence ([place]) [F10.20] 07/18/2018  . Bipolar I disorder, current or most recent episode depressed, with psychotic features ([place]) [F31.5] 07/17/2018   Total Time spent with patient: 20 minutes  Past Psychiatric History: See H&P  Past Medical History:  Past Medical History:  Diagnosis Date  . Anxiety   . Depression   . Hypertension    History reviewed. No pertinent  surgical history.  Family History: History reviewed. No pertinent family history.  Family Psychiatric  History: See H&P.  Social History:  Social History   Substance and Sexual Activity  Alcohol Use Yes   Comment: drinking daily past month     Social History   Substance and Sexual Activity  Drug Use Yes  . Types: Heroin    Social History   Socioeconomic History  . Marital status: Single    Spouse name: Not on file  . Number of children: Not on file  . Years of education: Not on file  . Highest education level: Not on file  Occupational History  . Not on file  Social Needs  . Financial resource strain: Not on file  . Food insecurity:    Worry: Not on file    Inability: Not on file  . Transportation needs:    Medical: Not on file    Non-medical: Not on file  Tobacco Use  . Smoking status: Current Every Day Smoker    Packs/day: 1.00    Types: Cigarettes  . Smokeless tobacco: Never Used  Substance and Sexual Activity  . Alcohol use: Yes    Comment: drinking daily past month  . Drug use: Yes    Types: Heroin  . Sexual activity: Not Currently  Lifestyle  . Physical activity:    Days per week: Not on file    Minutes per session: Not on file  . Stress: Not on file  Relationships  . Social connections:    Talks on phone: Not on file    Gets together:  Not on file    Attends religious service: Not on file    Active member of club or organization: Not on file    Attends meetings of clubs or organizations: Not on file    Relationship status: Not on file  Other Topics Concern  . Not on file  Social History Narrative  . Not on file   Additional Social History:   Sleep: Good  Appetite:  Good  Current Medications: Current Facility-Administered Medications  Medication Dose Route Frequency Provider Last Rate Last Dose  . acetaminophen (TYLENOL) tablet 650 mg  650 mg Oral Q6H PRN Nanci Pina, FNP   650 mg at 07/19/18 1658  . alum & mag hydroxide-simeth  (MAALOX/MYLANTA) 200-200-20 MG/5ML suspension 30 mL  30 mL Oral Q4H PRN Nanci Pina, FNP      . DULoxetine (CYMBALTA) DR capsule 60 mg  60 mg Oral Daily Pennelope Bracken, MD   60 mg at 07/21/18 1610  . gabapentin (NEURONTIN) capsule 400 mg  400 mg Oral QID Priscille Loveless S, FNP   400 mg at 07/21/18 9604  . hydrOXYzine (ATARAX/VISTARIL) tablet 50 mg  50 mg Oral Q6H PRN Pennelope Bracken, MD      . lisinopril (PRINIVIL,ZESTRIL) tablet 40 mg  40 mg Oral Daily Nanci Pina, FNP   40 mg at 07/21/18 0811  . magnesium hydroxide (MILK OF MAGNESIA) suspension 30 mL  30 mL Oral Daily PRN Starkes, Takia S, FNP      . nicotine (NICODERM CQ - dosed in mg/24 hours) patch 21 mg  21 mg Transdermal Daily [name], MD   21 mg at 07/21/18 0810  . OLANZapine (ZYPREXA) tablet 20 mg  20 mg Oral QHS Pennelope Bracken, MD   20 mg at 07/20/18 2108  . Oxcarbazepine (TRILEPTAL) tablet 300 mg  300 mg Oral BID Lindell Spar I, NP   300 mg at 07/21/18 5409  . thiamine (VITAMIN B-1) tablet 100 mg  100 mg Oral Daily Priscille Loveless S, FNP   100 mg at 07/21/18 8119   Or  . thiamine (B-1) injection 100 mg  100 mg Intravenous Daily Starkes, Takia S, FNP      . traZODone (DESYREL) tablet 200 mg  200 mg Oral QHS Pennelope Bracken, MD   200 mg at 07/20/18 2108   Lab Results:  No results found for this or any previous visit (from the past 48 hour(s)). Blood Alcohol level:  Lab Results  Component Value Date   ETH 173 (H) 14/78/2956   Metabolic Disorder Labs: Lab Results  Component Value Date   HGBA1C 4.6 (L) 07/18/2018   MPG 85.32 07/18/2018   No results found for: PROLACTIN Lab Results  Component Value Date   CHOL 179 07/18/2018   TRIG 359 (H) 07/18/2018   HDL 37 (L) 07/18/2018   CHOLHDL 4.8 07/18/2018   VLDL 72 (H) 07/18/2018   LDLCALC 70 07/18/2018   Physical Findings: AIMS: Facial and Oral Movements Muscles of Facial Expression: None, normal Lips and Perioral Area:  None, normal Jaw: None, normal Tongue: None, normal,Extremity Movements Upper (arms, wrists, hands, fingers): None, normal Lower (legs, knees, ankles, toes): None, normal, Trunk Movements Neck, shoulders, hips: None, normal, Overall Severity Severity of abnormal movements (highest score from questions above): None, normal Incapacitation due to abnormal movements: None, normal Patient's awareness of abnormal movements (rate only patient's report): No Awareness, Dental Status Current problems with teeth and/or dentures?: No Does patient usually wear dentures?:  No  CIWA:  CIWA-Ar Total: 2 COWS:  COWS Total Score: 4  Musculoskeletal: Strength & Muscle Tone: within normal limits Gait & Station: normal Patient leans: N/A  Psychiatric Specialty Exam: Physical Exam  Nursing note and vitals reviewed.   Review of Systems  Psychiatric/Behavioral: Positive for depression and substance abuse (Hx. Cocaine & alcohol use disorder). Negative for memory loss and suicidal ideas. Hallucinations:  hx. paranoid ideations. The patient is nervous/anxious and has insomnia.   denies headache, no chest pain, no dyspnea, no vomiting or diarrhea.  Blood pressure (!) 135/105, pulse (!) 103, temperature 98.1 F (36.7 C), temperature source Oral, resp. rate 16, height _0  (1.753 m), weight 82.1 kg.Body mass index is 26.73 kg/m.  General Appearance: fair   Eye Contact:  Good  Speech:  Normal Rate  Volume:  Normal  Mood:  improving , reports feeling better  Affect:  vaguely anxious affect   Thought Process:  Coherent and Goal Directed, associations intact   Orientation:  Full (Time, Place, and Person)  Thought Content:  denies hallucinations, no delusions, not internally preoccupied   Suicidal Thoughts:  denies suicidal or self injurious ideations, denies homicidal ideations  Homicidal Thoughts:  No  Memory:  recent and remote grossly intact   Judgement:  improving   Insight:  improving   Psychomotor  Activity:  Normal- no restlessness, no distal tremors, no diaphoresis or agitation  Concentration:  Concentration: improving   Recall:  good  Fund of Knowledge:  good   Language:  good  Akathisia:  No  Handed:    AIMS (if indicated):     Assets:  Resilience Social Support  ADL's:  Intact  Cognition:  WNL  Sleep:  Number of Hours: 6.25      Assessment - 52 y/o male, separated, lives alone.Reports he has been diagnosed with Bipolar Disorder. Admitted after heroin overdose, which he reports was suicidal in intent. Reports he had been struggling with worsening depression. Reports alcohol and cannabis use disorder . States had been drinking heavily over the last month prior to admission. Today reports feeling better, and states he has not had any suicidal ideations, presents future oriented/wanting to go to rehab program at discharge . Also, denies hallucinations and does not present internally preoccupied . Denies medication side effects, and specifically denies sedation or drowsiness ( reports he had been on combination Neurontin, Trazodone, Zyprexa, Cymbalta prior to admission without side effects)  *BP readings reviewed with hospitalist consultant, recommendation is not to make antihypertensive medication changes or adjustment at this time and  to continue Lisinopril at current dose for now, continue to monitor. Patient expresses interest in Campral for management of alcohol cravings .  Treatment Plan Summary: Daily contact with patient to assess and evaluate symptoms and progress in treatment. Treatment Plan reviewed as below today 9/28  Encourage group and milieu group participation to work on coping skills and symptom reduction Encourage efforts to work on sobriety and relapse prevention  Treatment team working on disposition planning - patient planning on going to rehab ( [facility]) Continue Cymbalta 60 mgrs daily for depression, anxiety Continue Neurontin 400 mgrs QID for anxiety and  pain Continue Trazodone 200 mgrs QHS for insomnia. Currently completing Ativan detox protocol for alcohol WDL Continue Zyprexa 20 mgrs QHS for mood disorder, psychosis Continue Trileptal 300 mgrs BID for mood disorder  Continue Lisinopril 40 mgrs QDAY for HTN Start Campral 666 mgrs TID for alcohol related cravings -side effects discussed (  CrCl 94)  Jenne Campus, MD, 07/21/2018, 9:50 AM   Patient ID: Donald Carroll, male   DOB: 1966/07/05, 51 y.o.   MRN: [id number]

## 2018-07-21 NOTE — [facility] Group Notes (Signed)
[facility] Group Notes:  (Nursing/MHT/Case Management/Adjunct)  Date:  07/21/2018  Time:  1:30 PM  Type of Therapy:  Nurse Education  Participation Level:  Did Not Attend  Summary of Progress/Problems: Patient was invited, but did not attend.  Dewayne Shorter 07/21/2018, 3:18 PM

## 2018-07-21 NOTE — [facility] Group Notes (Signed)
[facility] Group Notes: (Clinical Social Work)   07/21/2018      Type of Therapy:  Group Therapy   Participation Level:  Did Not Attend despite MHT prompting   Federica Allport N Merial Moritz, LCSW  07/21/2018  

## 2018-07-21 NOTE — Plan of Care (Signed)
Patient self inventory- Patient slept well last night, sleep medication was not requested. Appetite has been good, energy level normal, concentration good. Hopelessness, anxiety, and depression rated 7, 6, 8 out of 10. Endorses cravings, agitation, chilling, and irritability. Denies SI HI AVH. Endorses pain level 6/10 in his right knee. Patient's goal is "relapse prevention."  Patient is compliant with medications prescribed per provider. Safety is maintained with 15 minute checks as well as environmental checks. Will continue to monitor.  Problem: Education: Goal: Mental status will improve Outcome: Progressing   Problem: Education: Goal: Verbalization of understanding the information provided will improve Outcome: Progressing   Problem: Activity: Goal: Interest or engagement in activities will improve Outcome: Progressing

## 2018-07-22 DIAGNOSIS — F315 Bipolar disorder, current episode depressed, severe, with psychotic features: Principal | ICD-10-CM

## 2018-07-22 MED ORDER — OXCARBAZEPINE 300 MG PO TABS: 300.0000 mg | ORAL_TABLET | Freq: Two times a day (BID) | ORAL | 0 refills | Status: DC

## 2018-07-22 MED ORDER — AMLODIPINE BESYLATE 5 MG PO TABS: 5.0000 mg | ORAL_TABLET | Freq: Every day | ORAL | 0 refills | Status: DC

## 2018-07-22 MED ORDER — DULOXETINE HCL 60 MG PO CPEP: 60.0000 mg | ORAL_CAPSULE | Freq: Every day | ORAL | 0 refills | Status: DC

## 2018-07-22 MED ORDER — OLANZAPINE 20 MG PO TABS: 20.0000 mg | ORAL_TABLET | Freq: Every day | ORAL | 0 refills | Status: DC

## 2018-07-22 MED ORDER — AMLODIPINE BESYLATE 5 MG PO TABS
5.0000 mg | ORAL_TABLET | Freq: Every day | ORAL | Status: DC
Start: 2018-07-22 — End: 2018-07-24
  Administered 2018-07-22 – 2018-07-23 (×2): 5 mg via ORAL
  Filled 2018-07-22: qty 14
  Filled 2018-07-22: qty 1
  Filled 2018-07-22: qty 14
  Filled 2018-07-22 (×2): qty 1

## 2018-07-22 MED ORDER — TRAZODONE HCL 100 MG PO TABS: 200.0000 mg | ORAL_TABLET | Freq: Every day | ORAL | 0 refills | Status: DC

## 2018-07-22 MED ORDER — LISINOPRIL 40 MG PO TABS: 40.0000 mg | ORAL_TABLET | Freq: Every day | ORAL | 0 refills | Status: DC

## 2018-07-22 MED ORDER — GABAPENTIN 400 MG PO CAPS: 400.0000 mg | ORAL_CAPSULE | Freq: Four times a day (QID) | ORAL | 0 refills | Status: DC

## 2018-07-22 MED ORDER — HYDROXYZINE HCL 50 MG PO TABS: 50.0000 mg | ORAL_TABLET | Freq: Four times a day (QID) | ORAL | 0 refills | Status: DC | PRN

## 2018-07-22 MED ORDER — ACAMPROSATE CALCIUM 333 MG PO TBEC: 666.0000 mg | DELAYED_RELEASE_TABLET | Freq: Three times a day (TID) | ORAL | 0 refills | Status: DC

## 2018-07-22 NOTE — Discharge Summary (Addendum)
Physician Discharge Summary Note  Patient:  Donald Carroll is an 52 y.o., male MRN:  [id number] DOB:  1966/03/18 Patient phone:  [phone] (home)  Patient address:   [place] [address],  Total Time spent with patient: 20 minutes  Date of Admission:  07/17/2018 Date of Discharge: 07/23/18  Reason for Admission:  Per assessment note-Donald Carroll is a 52 y/o M with history of PTSD, bipolar, and substance use who was admitted voluntarily from [facility] where he presented initially brought in by EMS after he was found unresponsive in the community at a bus stop and was revived with narcan after overdose of heroin in a suicide attempt. Pt later confirmed he was attempting suicide in ED and he was voluntary for admission to [facility]. He was medically cleared and then transferred to [facility] for additional treatment and stabilization.Upon initial interview, pt shares, "I was feeling suicidal, and I had a suicide plan. I saw my therapist at [facility], and she said to go to the [facility]. Well on the way to the [facility] I met this guy at the bus stop and he had some heroin, so I figured that'd be a quicker way to go, so I did any overdose - like enough to kill three people, and then I guess they found me and brought me in." Pt reports he is still feeling suicidal but he is able to contract for safety while in the [facility]. He shares that his mood has been depressed for the last month severely related to separating from his wife due to her increasing use of alcohol. Pt reports that he then relapsed on alcohol and has been having increasing use over the past 4 weeks. He has poor sleep with 1.5 hours per night, anhedonia, guilty feelings, low energy, poor concentration, and poor appetite. He has distractibility, flight of ideas, increased activities, and some mild pressured speech as per his subjective report. He denies HI/VH. He endorses some AH but reports it is internal and his own voice; however, it is  intrusive and encourages him to kill himself. Pt has been drinking about 12 beers per day starting 1 month ago and then increasing his intake daily to ultimately a 1/5th of hard alcohol and 36 beers per day prior to admission. He also smokes 1 ppd and uses cannabis about 6-7 times per day. He denies other recent illicit substance use.   Principal Problem: Bipolar I disorder, current or most recent episode depressed, with psychotic features [facility]) Discharge Diagnoses: Patient Active Problem List   Diagnosis Date Noted  . PTSD (post-traumatic stress disorder) [F43.10] 07/18/2018  . Alcohol use disorder, severe, dependence ([place]) [F10.20] 07/18/2018  . Bipolar I disorder, current or most recent episode depressed, with psychotic features ([place]) [F31.5] 07/17/2018    Past Psychiatric History:   Past Medical History:  Past Medical History:  Diagnosis Date  . Anxiety   . Depression   . Hypertension    History reviewed. No pertinent surgical history. Family History: History reviewed. No pertinent family history. Family Psychiatric  History:  Social History:  Social History   Substance and Sexual Activity  Alcohol Use Yes   Comment: drinking daily past month     Social History   Substance and Sexual Activity  Drug Use Yes  . Types: Heroin    Social History   Socioeconomic History  . Marital status: Single    Spouse name: Not on file  . Number of children: Not on file  .  Years of education: Not on file  . Highest education level: Not on file  Occupational History  . Not on file  Social Needs  . Financial resource strain: Not on file  . Food insecurity:    Worry: Not on file    Inability: Not on file  . Transportation needs:    Medical: Not on file    Non-medical: Not on file  Tobacco Use  . Smoking status: Current Every Day Smoker    Packs/day: 1.00    Types: Cigarettes  . Smokeless tobacco: Never Used  Substance and Sexual Activity  . Alcohol use: Yes    Comment:  drinking daily past month  . Drug use: Yes    Types: Heroin  . Sexual activity: Not Currently  Lifestyle  . Physical activity:    Days per week: Not on file    Minutes per session: Not on file  . Stress: Not on file  Relationships  . Social connections:    Talks on phone: Not on file    Gets together: Not on file    Attends religious service: Not on file    Active member of club or organization: Not on file    Attends meetings of clubs or organizations: Not on file    Relationship status: Not on file  Other Topics Concern  . Not on file  Social History Narrative  . Not on file    [facility] Course:  [facility] was admitted for Bipolar I disorder, current or most recent episode depressed, with psychotic features [facility]) and crisis management.  Pt was treated discharged with the medications listed below under Medication List.  Medical problems were identified and treated as needed.  Home medications were restarted as appropriate.  Improvement was monitored by observation and [facility] 's daily report of symptom reduction.  Emotional and mental status was monitored by daily self-inventory reports completed by [facility] and clinical staff.         [facility] was evaluated by the treatment team for stability and plans for continued recovery upon discharge. [facility] 's motivation was an integral factor for scheduling further treatment. Employment, transportation, bed availability, health status, family support, and any pending legal issues were also considered during [facility] stay. Pt was offered further treatment options upon discharge including but not limited to Residential, Intensive Outpatient, and Outpatient treatment.  [facility] will follow up with the services as listed below under Follow Up Information.     Upon completion of this admission the patient was both mentally and medically stable for discharge denying suicidal/homicidal ideation,  auditory/visual/tactile hallucinations, delusional thoughts and paranoia.    Donald [facility] responded well to treatment with Cymbalta, Neurontin 400 mg and Zyprexa 20 mg  without adverse effects.. Pt demonstrated improvement without reported or observed adverse effects to the point of stability appropriate for outpatient management. Pertinent labs include: A1C 4.6 for which outpatient follow-up is necessary CBC and CMP  recheck as mentioned below. Reviewed CBC, CMP, BAL, and UDS =THC; all unremarkable aside from noted exceptions.   Physical Findings: AIMS: Facial and Oral Movements Muscles of Facial Expression: None, normal Lips and Perioral Area: None, normal Jaw: None, normal Tongue: None, normal,Extremity Movements Upper (arms, wrists, hands, fingers): None, normal Lower (legs, knees, ankles, toes): None, normal, Trunk Movements Neck, shoulders, hips: None, normal, Overall Severity Severity of abnormal movements (highest score from questions above): None, normal Incapacitation due to abnormal movements: None, normal  Patient's awareness of abnormal movements (rate only patient's report): No Awareness, Dental Status Current problems with teeth and/or dentures?: No Does patient usually wear dentures?: No  CIWA:  CIWA-Ar Total: 2 COWS:  COWS Total Score: 4  Musculoskeletal: Strength & Muscle Tone: within normal limits Gait & Station: normal Patient leans: N/A  Psychiatric Specialty Exam: Physical Exam  Vitals reviewed. Constitutional: He appears well-developed.  Neurological: He is alert.  Psychiatric: He has a normal mood and affect. His behavior is normal.    Review of Systems  Psychiatric/Behavioral: Negative for depression (stable ) and suicidal ideas. The patient is not nervous/anxious.   All other systems reviewed and are negative.   Blood pressure (!) 137/106, pulse (!) 103, temperature 98.1 F (36.7 C), temperature source Oral, resp. rate 16, height 5' 9"  (1.753 m),  weight 82.1 kg.Body mass index is 26.73 kg/m.   Have you used any form of tobacco in the last 30 days? (Cigarettes, Smokeless Tobacco, Cigars, and/or Pipes): Yes  Has this patient used any form of tobacco in the last 30 days? (Cigarettes, Smokeless Tobacco, Cigars, and/or Pipes) Yes, Yes, A prescription for an FDA-approved tobacco cessation medication was offered at discharge and the patient refused  Blood Alcohol level:  Lab Results  Component Value Date   ETH 173 (H) 05/39/7673    Metabolic Disorder Labs:  Lab Results  Component Value Date   HGBA1C 4.6 (L) 07/18/2018   MPG 85.32 07/18/2018   No results found for: PROLACTIN Lab Results  Component Value Date   CHOL 179 07/18/2018   TRIG 359 (H) 07/18/2018   HDL 37 (L) 07/18/2018   CHOLHDL 4.8 07/18/2018   VLDL 72 (H) 07/18/2018   LDLCALC 70 07/18/2018    See Psychiatric Specialty Exam and Suicide Risk Assessment completed by Attending Physician prior to discharge.  Discharge destination:  [facility]  Is patient on multiple antipsychotic therapies at discharge:  No   Has Patient had three or more failed trials of antipsychotic monotherapy by history:  No  Recommended Plan for Multiple Antipsychotic Therapies: NA   Allergies as of 07/22/2018   No Known Allergies     Medication List    STOP taking these medications   naproxen sodium 220 MG tablet Commonly known as:  ALEVE     TAKE these medications     Indication  acamprosate 333 MG tablet Commonly known as:  CAMPRAL Take 2 tablets (666 mg total) by mouth 3 (three) times daily with meals. For alcohol cravings  Indication:  Excessive Use of Alcohol   DULoxetine 60 MG capsule Commonly known as:  CYMBALTA Take 1 capsule (60 mg total) by mouth daily. For mood control Start taking on:  07/23/2018 What changed:    medication strength  how much to take  additional instructions  Indication:  Major Depressive Disorder   gabapentin 400 MG  capsule Commonly known as:  NEURONTIN Take 1 capsule (400 mg total) by mouth 4 (four) times daily. For mood control and withdrawal symptoms What changed:  additional instructions  Indication:  Alcohol Withdrawal Syndrome   hydrOXYzine 50 MG tablet Commonly known as:  ATARAX/VISTARIL Take 1 tablet (50 mg total) by mouth every 6 (six) hours as needed for anxiety.  Indication:  Feeling Anxious   lisinopril 40 MG tablet Commonly known as:  PRINIVIL,ZESTRIL Take 1 tablet (40 mg total) by mouth daily. For high blood pressure What changed:  additional instructions  Indication:  High Blood Pressure Disorder   OLANZapine 20 MG  tablet Commonly known as:  ZYPREXA Take 1 tablet (20 mg total) by mouth at bedtime. For mood control What changed:    medication strength  how much to take  additional instructions  Indication:  mood stability   Oxcarbazepine 300 MG tablet Commonly known as:  TRILEPTAL Take 1 tablet (300 mg total) by mouth 2 (two) times daily. For mood control  Indication:  Mood stabilization.   traZODone 100 MG tablet Commonly known as:  DESYREL Take 2 tablets (200 mg total) by mouth at bedtime. For mood control What changed:    medication strength  how much to take  additional instructions  Indication:  [place], mood stability      Follow-up Information    Services, [facility] Follow up on 07/18/2018.   Why:  Screening for possible admission on Monday, 07/18/18 at 7:45AM. Please bring photo ID/Proof of [facility] residency, 14 day supply of medications provided by the [facility] and clothing. Thank you.  Contact information: [address] [place] [address] [phone]        [facility] Follow up.   Contact information: [address] [place] [address] [phone]           Follow-up recommendations:  Activity:  as tolerated Diet:  heart healthy  Comments:  Take all medications as  prescribed. Keep all follow-up appointments as scheduled.  Do not consume alcohol or use illegal drugs while on prescription medications. Report any adverse effects from your medications to your primary care provider promptly.  In the event of recurrent symptoms or worsening symptoms, call 911, a crisis hotline, or go to the nearest emergency department for evaluation.   Signed: [place], FNP 07/22/2018, 9:09 AM   Patient seen, Suicide Assessment Completed.  Disposition Plan Reviewed

## 2018-07-22 NOTE — Progress Notes (Signed)
Patient did attend the evening speaker AA meeting.  

## 2018-07-22 NOTE — BHH Suicide Risk Assessment (Signed)
[facility] Discharge Suicide Risk Assessment   Principal Problem: Bipolar I disorder, current or most recent episode depressed, with psychotic features [facility]) Discharge Diagnoses:  Patient Active Problem List   Diagnosis Date Noted  . PTSD (post-traumatic stress disorder) [F43.10] 07/18/2018  . Alcohol use disorder, severe, dependence (HCC) [F10.20] 07/18/2018  . Bipolar I disorder, current or most recent episode depressed, with psychotic features (HCC) [F31.5] 07/17/2018    Total Time spent with patient: 30 minutes  Musculoskeletal: Strength & Muscle Tone: within normal limits Gait & Station: normal Patient leans: N/A  Psychiatric Specialty Exam: ROS no headache, no chest pain, no shortness of breath, no vomiting, no rash  Blood pressure (!) 137/106, pulse (!) 103, temperature 98.1 F (36.7 C), temperature source Oral, resp. rate 16, height 5\' 9"  (1.753 m), weight 82.1 kg.Body mass index is 26.73 kg/m.  General Appearance: improving grooming   Eye Contact::  Good  Speech:  Normal Rate409  Volume:  Normal  Mood:  reports feeling better today, describes mood as improved, describes as 7/10 with 10 being best   Affect:  more  reactive, vaguely anxious   Thought Process:  Linear and Descriptions of Associations: Intact  Orientation:  Full (Time, Place, and Person)  Thought Content:  no current hallucinations, no delusions, not internally preoccupied  Suicidal Thoughts:  No currently denies suicidal or self injurious ideations, denies homicidal or violent ideations  Homicidal Thoughts:  No  Memory:  recent and remote grossly intact   Judgement:  Other:  improving   Insight:  improving   Psychomotor Activity:  Normal  Concentration:  Good  Recall:  Good  Fund of Knowledge:Good  Language: Good  Akathisia:  Negative  Handed:  Right  AIMS (if indicated):     Assets:  Desire for Improvement Resilience  Sleep:  Number of Hours: 6.75  Cognition: WNL  ADL's:  Intact   Mental Status Per  Nursing Assessment::   On Admission:  Suicidal ideation indicated by patient, Self-harm thoughts, Intention to act on suicide plan  Demographic Factors:  52 , separated, has three adult children, was living alone prior to admission, employed   Loss Factors: Separation, relapse on alcohol  Historical Factors: History of alcohol abuse, which he states started recently following marital separation, reports history of depression, history of prior suicide attempt in the past by overdosing   Risk Reduction Factors:   Sense of responsibility to family, Positive social support and Positive coping skills or problem solving skills  Continued Clinical Symptoms:  At this time patient reports he is feeling better. Presents alert , attentive, describes improved mood, affect reactive, vaguely anxious, no thought disorder, no suicidal or self injurious ideations, no homicidal or violent ideations, no suicidal or self injurious ideations, no psychotic symptoms, future oriented, reports feeling motivated in going to a rehab setting at discharge. He is planning to go to [facility] rehab program and upon completion, planning to return to [place] and attend [facility] and AA meetings there.  Denies medication side effects. * Reviewed with hospitalist- BP remains elevated ,without associated symptoms- recommendation is to add Amlodipine 5 mgrs QDAY.  Cognitive Features That Contribute To Risk:  No gross cognitive deficits noted upon discharge. Is alert , attentive, and oriented x 3   Suicide Risk:  Mild:  Suicidal ideation of limited frequency, intensity, duration, and specificity.  There are no identifiable plans, no associated intent, mild dysphoria and related symptoms, good self-control (both objective and subjective assessment), few other risk factors, and identifiable  protective factors, including available and accessible social support.  Follow-up Information    Services, [facility] Follow up on  07/18/2018.   Why:  Screening for possible admission on Monday, 07/18/18 at 7:45AM. Please bring photo ID/Proof of [facility] residency, 14 day supply of medications provided by the hospital and clothing. Thank you.  Contact information: [address] [place] [address] [phone]        [facility] Follow up.   Contact information: [address] [place] [address] [phone]           Plan Of Care/Follow-up recommendations:  Activity:  as tolerated  Diet:  heart healthy Tests:  NA Other:  See below  Patient expresses readiness for discharge, looking forward to going to a rehab . Patient reports he is planning on going to [facility] tomorrow early in AM for admission to rehab setting. Leaves unit early in AM tomorrow. Craige Cotta, MD 07/22/2018, 11:13 AM

## 2018-07-22 NOTE — BHH Group Notes (Signed)
BHH Group Notes:  (Nursing/MHT/Case Management/Adjunct)  Date:  07/22/2018  Time:  1:15 PM  Type of Therapy:  Nurse Education  Participation Level:  Active  Participation Quality:  Appropriate and Attentive  Affect:  Appropriate  Cognitive:  Alert and Appropriate  Insight:  Appropriate  Engagement in Group:  Engaged  Modes of Intervention:  Discussion and Education  Summary of Progress/Problems: pt discussed healthy and unhealthy support systems and how to utilize positive ones for change.    Suszanne Conners Jackquelyn Sundberg 07/22/2018, 5:41 PM

## 2018-07-22 NOTE — BHH Group Notes (Signed)
BHH LCSW Group Therapy Note  Date/Time:  07/22/2018 9:10 AM - 10:10 AM  Type of Therapy and Topic:  Group Therapy:  Healthy and Unhealthy Supports  Participation Level:  Active  Description of Group:  Patients in this group were introduced to the idea of adding a variety of healthy supports to address the various needs in their lives. Patients were asked to role play scenarios or the definition of the type of support given with a partner. Patients discussed what additional healthy supports could be helpful in their recovery and wellness after discharge in order to prevent future hospitalizations. An emphasis was placed on using counselor, doctor, therapy groups, 12-step groups, and problem-specific support groups to expand supports. They also worked as a group on developing a specific plan for several patients to deal with unhealthy supports through boundary-setting, psychoeducation with loved ones, and even termination of relationships.   Therapeutic Goals:   1)  discuss importance of adding supports to stay well once out of the hospital  2)  compare healthy versus unhealthy supports and identify some examples of each  3)  generate ideas and descriptions of healthy supports that can be added  4)  offer mutual support about how to address unhealthy supports  5)  encourage active participation in and adherence to discharge plan    Summary of Patient Progress:  Patient was late to group. Pt reports wanting to add everyone that he is pretty much restarting his life that he wants a Education officer, environmental and a Veterinary surgeon.   Therapeutic Modalities:   Motivational Interviewing Brief Solution-Focused Therapy  Raeanne Gathers, LCSW

## 2018-07-22 NOTE — Progress Notes (Signed)
  [facility] Adult Case Management Discharge Plan :  Will you be returning to the same living situation after discharge:  No. Pt will be going to [facility] program.  At discharge, do you have transportation home?: Yes,  transportation was arranged with assigned CSW.  Do you have the ability to pay for your medications: No.   Patient to Follow up at: Follow-up Information    [facility] Follow up on 07/18/2018.   Why:  Screening for possible admission on Monday, 07/18/18 at 7:45AM. Please bring photo ID/Proof of [facility] residency, 14 day supply of medications provided by the hospital and clothing. Thank you.  Contact information: [address] [place] [address] [phone]        [facility] Follow up.   Contact information: [address] [place] [address] [phone]           Next level of care provider has access to [facility] Link:no  Safety Planning and Suicide Prevention discussed: Yes,  completed with patient.   Have you used any form of tobacco in the last 30 days? (Cigarettes, Smokeless Tobacco, Cigars, and/or Pipes): Yes  Has patient been referred to the Quitline?: Patient refused referral  Patient has been referred for addiction treatment: Yes  Shellia Cleverly, LCSW 07/22/2018, 12:53 PM

## 2018-07-22 NOTE — Consult Note (Signed)
52 year old with past medical history relevant for hypertension, chronic pain, alcohol abuse, bipolar 1 disorder admitted for inpatient psychiatry due to suicidal ideations and alcohol abuse.  General medicine was called for blood pressure management.  Apparently the patient is on lisinopril 40 mg daily and his blood pressures have consistently shown systolics greater than 135 and diastolics greater than 100.  Internal medicine was consulted for possible addition of new medication.  Recommendations: -Continue lisinopril 40 mg daily -Start amlodipine 5 mg daily, will take effect in 24 to 48 hours and so do not expect to see dramatic drops in blood pressure as he is only mildly elevated.

## 2018-07-22 NOTE — Plan of Care (Signed)
D: Patient presents flat, calm, pleasant. He exhibits remorse from overdosing on heroin prior to admission. He is excited about discharge to [facility] tomorrow. Patient slept well last night, and did not require medication to help. His appetite is good, energy normal and concentration good. He rates his depression and sense of hopelessness 5/10 and anxiety 6/10. He denies withdrawal symptoms. He complains of pain 6/10 in right knee, chronic. Patient denies SI/HI/AVH.  A: Patient checked q15 min, and checks reviewed. Reviewed medication changes with patient and educated on side effects. Educated patient on importance of attending group therapy sessions and educated on several coping skills. Encouarged participation in milieu through recreation therapy and attending meals with peers. Support and encouragement provided. Fluids offered. R: Patient receptive to education on medications, and is medication compliant. Patient contracts for safety on the unit. Goal: "Relapse prevention."

## 2018-07-22 NOTE — Progress Notes (Signed)
D   Pt is appropriate and pleasant   He said he is looking forward to being discharged tomorrow morning   He requested to get his medications before he leaves in the morning    A   Verbal support given   Medications administered and effectiveness monitored   Q 15 min checks R    Pt is safe at this time

## 2018-07-23 NOTE — Progress Notes (Signed)
Patient ID: CORD WILCZYNSKI, male   DOB: 03/18/1966, 52 y.o.   MRN: [id number] D: Assumed care patient @ 2330. Patient in bed sleeping. Respiration regular and unlabored. No sign of distress noted at this time A: 15 mins checks for safety. R: Patient remains safe.

## 2018-07-23 NOTE — Progress Notes (Signed)
Recreation Therapy Notes  Date: 9.30.19 Time: 0930 Location: 300 Hall Dayroom  Group Topic: Stress Management  Goal Area(s) Addresses:  Patient will verbalize importance of using healthy stress management.  Patient will identify positive emotions associated with healthy stress management.   Intervention: Stress Management  Activity :  Guided Imagery.  LRT introduced the stress management technique of guided imagery.  LRT read a script that guided patients a walk through a wildlife sanctuary.  Patients were to listen and follow along as the script was read.  Education:  Stress Management, Discharge Planning.   Education Outcome: Acknowledges edcuation/In group clarification offered/Needs additional education  Clinical Observations/Feedback: Pt did not attend group.     Donald Carroll, LRT/CTRS         Lillia Abed, Quanita Barona A 07/23/2018 11:35 AM

## 2018-07-23 NOTE — Progress Notes (Signed)
D: Patient appears calm and cooperative, denies SI/HI  at this time.   A: All personal items/money ($126) in locker returned to patient.  Patient  provided with discharge instructions and verbalized understanding. Medication administered as prescribed. Pt provided food.  R: Patient states he will comply with OP services and medications as prescribed. Patient escorted to lobby.

## 2019-08-03 ENCOUNTER — Encounter (HOSPITAL_COMMUNITY): Payer: Self-pay | Admitting: Emergency Medicine

## 2019-08-03 ENCOUNTER — Emergency Department (HOSPITAL_COMMUNITY)
Admission: EM | Admit: 2019-08-03 | Discharge: 2019-08-03 | Disposition: A | Discharge status: Other Acute Inpatient Hospital | Payer: Self-pay | Attending: Emergency Medicine | Admitting: Emergency Medicine

## 2019-08-03 ENCOUNTER — Inpatient Hospital Stay (HOSPITAL_COMMUNITY)
Admission: AD | Admit: 2019-08-03 | Discharge: 2019-08-11 | DRG: 885 | Disposition: A | Discharge status: Home / Self Care | Payer: No Typology Code available for payment source | Source: Intra-hospital | Attending: Psychiatry | Admitting: Psychiatry

## 2019-08-03 ENCOUNTER — Other Ambulatory Visit: Payer: Self-pay

## 2019-08-03 DIAGNOSIS — F4310 Post-traumatic stress disorder, unspecified: Secondary | ICD-10-CM | POA: Diagnosis present

## 2019-08-03 DIAGNOSIS — G4700 Insomnia, unspecified: Secondary | ICD-10-CM | POA: Diagnosis present

## 2019-08-03 DIAGNOSIS — F1020 Alcohol dependence, uncomplicated: Secondary | ICD-10-CM | POA: Insufficient documentation

## 2019-08-03 DIAGNOSIS — F329 Major depressive disorder, single episode, unspecified: Secondary | ICD-10-CM | POA: Diagnosis not present

## 2019-08-03 DIAGNOSIS — R45851 Suicidal ideations: Secondary | ICD-10-CM | POA: Diagnosis present

## 2019-08-03 DIAGNOSIS — I10 Essential (primary) hypertension: Secondary | ICD-10-CM | POA: Diagnosis present

## 2019-08-03 DIAGNOSIS — Z20828 Contact with and (suspected) exposure to other viral communicable diseases: Secondary | ICD-10-CM | POA: Insufficient documentation

## 2019-08-03 DIAGNOSIS — F17210 Nicotine dependence, cigarettes, uncomplicated: Secondary | ICD-10-CM | POA: Diagnosis present

## 2019-08-03 DIAGNOSIS — F10230 Alcohol dependence with withdrawal, uncomplicated: Secondary | ICD-10-CM | POA: Diagnosis not present

## 2019-08-03 DIAGNOSIS — F1120 Opioid dependence, uncomplicated: Secondary | ICD-10-CM | POA: Diagnosis present

## 2019-08-03 DIAGNOSIS — Z23 Encounter for immunization: Secondary | ICD-10-CM

## 2019-08-03 DIAGNOSIS — F1994 Other psychoactive substance use, unspecified with psychoactive substance-induced mood disorder: Secondary | ICD-10-CM | POA: Diagnosis not present

## 2019-08-03 DIAGNOSIS — Z79899 Other long term (current) drug therapy: Secondary | ICD-10-CM | POA: Insufficient documentation

## 2019-08-03 DIAGNOSIS — F332 Major depressive disorder, recurrent severe without psychotic features: Secondary | ICD-10-CM | POA: Insufficient documentation

## 2019-08-03 DIAGNOSIS — F319 Bipolar disorder, unspecified: Principal | ICD-10-CM | POA: Diagnosis present

## 2019-08-03 DIAGNOSIS — F11259 Opioid dependence with opioid-induced psychotic disorder, unspecified: Secondary | ICD-10-CM | POA: Diagnosis not present

## 2019-08-03 DIAGNOSIS — F1024 Alcohol dependence with alcohol-induced mood disorder: Secondary | ICD-10-CM | POA: Diagnosis present

## 2019-08-03 LAB — COMPREHENSIVE METABOLIC PANEL
ALT: 20 U/L (ref 0–44)
AST: 16 U/L (ref 15–41)
Albumin: 4.3 g/dL (ref 3.5–5.0)
Alkaline Phosphatase: 87 U/L (ref 38–126)
Anion gap: 15 (ref 5–15)
BUN: 8 mg/dL (ref 6–20)
CO2: 16 mmol/L — ABNORMAL LOW (ref 22–32)
Calcium: 9 mg/dL (ref 8.9–10.3)
Chloride: 102 mmol/L (ref 98–111)
Creatinine, Ser: 0.86 mg/dL (ref 0.61–1.24)
GFR calc Af Amer: >60 mL/min (ref >60)
GFR calc non Af Amer: >60 mL/min (ref >60)
Glucose, Bld: 216 mg/dL — ABNORMAL HIGH (ref 70–99)
Potassium: 3.1 mmol/L — ABNORMAL LOW (ref 3.5–5.1)
Sodium: 133 mmol/L — ABNORMAL LOW (ref 135–145)
Total Bilirubin: 0.6 mg/dL (ref 0.3–1.2)
Total Protein: 7.5 g/dL (ref 6.5–8.1)

## 2019-08-03 LAB — CBC WITH DIFFERENTIAL/PLATELET
Abs Immature Granulocytes: 0.1 10*3/uL — ABNORMAL HIGH (ref 0.00–0.07)
Basophils Absolute: 0.1 10*3/uL (ref 0.0–0.1)
Basophils Relative: 1 %
Eosinophils Absolute: 0.3 10*3/uL (ref 0.0–0.5)
Eosinophils Relative: 2 %
HCT: 46.4 % (ref 39.0–52.0)
Hemoglobin: 16.1 g/dL (ref 13.0–17.0)
Immature Granulocytes: 1 %
Lymphocytes Relative: 19 %
Lymphs Abs: 2.5 10*3/uL (ref 0.7–4.0)
MCH: 31.3 pg (ref 26.0–34.0)
MCHC: 34.7 g/dL (ref 30.0–36.0)
MCV: 90.3 fL (ref 80.0–100.0)
Monocytes Absolute: 0.8 10*3/uL (ref 0.1–1.0)
Monocytes Relative: 6 %
Neutro Abs: 9.6 10*3/uL — ABNORMAL HIGH (ref 1.7–7.7)
Neutrophils Relative %: 71 %
Platelets: 241 10*3/uL (ref 150–400)
RBC: 5.14 MIL/uL (ref 4.22–5.81)
RDW: 13.6 % (ref 11.5–15.5)
WBC: 13.3 10*3/uL — ABNORMAL HIGH (ref 4.0–10.5)
nRBC: 0 % (ref 0.0–0.2)

## 2019-08-03 LAB — RAPID URINE DRUG SCREEN, HOSP PERFORMED
Amphetamines: NOT DETECTED
Barbiturates: NOT DETECTED
Benzodiazepines: NOT DETECTED
Cocaine: NOT DETECTED
Opiates: NOT DETECTED
Tetrahydrocannabinol: POSITIVE — AB

## 2019-08-03 LAB — ETHANOL: Alcohol, Ethyl (B): 57 mg/dL — ABNORMAL HIGH (ref <10)

## 2019-08-03 LAB — SARS CORONAVIRUS 2 (TAT 6-24 HRS): SARS Coronavirus 2: NEGATIVE

## 2019-08-03 MED ORDER — MAGNESIUM HYDROXIDE 400 MG/5ML PO SUSP: 30.0000 mL | Freq: Every day | ORAL | Status: DC | PRN

## 2019-08-03 MED ORDER — HYDROXYZINE HCL 25 MG PO TABS: 25.0000 mg | ORAL_TABLET | Freq: Three times a day (TID) | ORAL | Status: DC | PRN

## 2019-08-03 MED ORDER — OLANZAPINE 10 MG PO TABS: 20.0000 mg | ORAL_TABLET | Freq: Every day | ORAL | Status: DC

## 2019-08-03 MED ORDER — LISINOPRIL 20 MG PO TABS
40.0000 mg | ORAL_TABLET | Freq: Every day | ORAL | Status: DC
  Administered 2019-08-03: 40 mg via ORAL
  Filled 2019-08-03: qty 2

## 2019-08-03 MED ORDER — LORAZEPAM 2 MG/ML IJ SOLN: 0.0000 mg | Freq: Four times a day (QID) | INTRAMUSCULAR | Status: DC

## 2019-08-03 MED ORDER — LORAZEPAM 1 MG PO TABS: 1.0000 mg | ORAL_TABLET | Freq: Four times a day (QID) | ORAL | Status: AC | PRN

## 2019-08-03 MED ORDER — VITAMIN B-1 100 MG PO TABS
100.0000 mg | ORAL_TABLET | Freq: Every day | ORAL | Status: DC
  Administered 2019-08-04 – 2019-08-11 (×8): 100 mg via ORAL
  Filled 2019-08-03 (×10): qty 1

## 2019-08-03 MED ORDER — ONDANSETRON 4 MG PO TBDP: 4.0000 mg | ORAL_TABLET | Freq: Four times a day (QID) | ORAL | Status: DC | PRN

## 2019-08-03 MED ORDER — THIAMINE HCL 100 MG/ML IJ SOLN: 100.0000 mg | Freq: Once | INTRAMUSCULAR | Status: DC

## 2019-08-03 MED ORDER — POTASSIUM CHLORIDE CRYS ER 20 MEQ PO TBCR
40.0000 meq | EXTENDED_RELEASE_TABLET | Freq: Once | ORAL | Status: AC
  Administered 2019-08-03: 40 meq via ORAL

## 2019-08-03 MED ORDER — POTASSIUM CHLORIDE CRYS ER 20 MEQ PO TBCR
40.0000 meq | EXTENDED_RELEASE_TABLET | Freq: Once | ORAL | Status: DC
  Filled 2019-08-03: qty 2

## 2019-08-03 MED ORDER — HYDROXYZINE HCL 25 MG PO TABS
25.0000 mg | ORAL_TABLET | Freq: Four times a day (QID) | ORAL | Status: AC | PRN
  Administered 2019-08-04: 25 mg via ORAL
  Filled 2019-08-03 (×2): qty 1

## 2019-08-03 MED ORDER — LORAZEPAM 1 MG PO TABS: 0.0000 mg | ORAL_TABLET | Freq: Two times a day (BID) | ORAL | Status: DC

## 2019-08-03 MED ORDER — GABAPENTIN 400 MG PO CAPS
400.0000 mg | ORAL_CAPSULE | Freq: Four times a day (QID) | ORAL | Status: DC
  Administered 2019-08-03: 400 mg via ORAL
  Filled 2019-08-03: qty 1

## 2019-08-03 MED ORDER — LOPERAMIDE HCL 2 MG PO CAPS: 2.0000 mg | ORAL_CAPSULE | ORAL | Status: DC | PRN

## 2019-08-03 MED ORDER — HYDROXYZINE HCL 50 MG PO TABS: 50.0000 mg | ORAL_TABLET | Freq: Four times a day (QID) | ORAL | Status: DC | PRN

## 2019-08-03 MED ORDER — ALUM & MAG HYDROXIDE-SIMETH 200-200-20 MG/5ML PO SUSP: 30.0000 mL | ORAL | Status: DC | PRN

## 2019-08-03 MED ORDER — ACETAMINOPHEN 325 MG PO TABS
650.0000 mg | ORAL_TABLET | Freq: Four times a day (QID) | ORAL | Status: DC | PRN
  Administered 2019-08-11: 650 mg via ORAL
  Filled 2019-08-03: qty 2

## 2019-08-03 MED ORDER — THIAMINE HCL 100 MG/ML IJ SOLN: 100.0000 mg | Freq: Every day | INTRAMUSCULAR | Status: DC

## 2019-08-03 MED ORDER — OXCARBAZEPINE 300 MG PO TABS
300.0000 mg | ORAL_TABLET | Freq: Two times a day (BID) | ORAL | Status: DC
  Administered 2019-08-03: 300 mg via ORAL
  Filled 2019-08-03: qty 1

## 2019-08-03 MED ORDER — LORAZEPAM 1 MG PO TABS
0.0000 mg | ORAL_TABLET | Freq: Four times a day (QID) | ORAL | Status: DC
  Administered 2019-08-03: 1 mg via ORAL
  Filled 2019-08-03: qty 1

## 2019-08-03 MED ORDER — MAGNESIUM OXIDE 400 (241.3 MG) MG PO TABS
800.0000 mg | ORAL_TABLET | Freq: Once | ORAL | Status: AC
  Administered 2019-08-03: 800 mg via ORAL
  Filled 2019-08-03: qty 2

## 2019-08-03 MED ORDER — AMLODIPINE BESYLATE 5 MG PO TABS
5.0000 mg | ORAL_TABLET | Freq: Every day | ORAL | Status: DC
  Administered 2019-08-03: 5 mg via ORAL
  Filled 2019-08-03: qty 1

## 2019-08-03 MED ORDER — ADULT MULTIVITAMIN W/MINERALS CH
1.0000 | ORAL_TABLET | Freq: Every day | ORAL | Status: DC
  Administered 2019-08-04 – 2019-08-11 (×8): 1 via ORAL
  Filled 2019-08-03 (×12): qty 1

## 2019-08-03 MED ORDER — CHLORDIAZEPOXIDE HCL 25 MG PO CAPS
50.0000 mg | ORAL_CAPSULE | Freq: Once | ORAL | Status: AC
  Administered 2019-08-03: 50 mg via ORAL
  Filled 2019-08-03: qty 2

## 2019-08-03 MED ORDER — VITAMIN B-1 100 MG PO TABS
100.0000 mg | ORAL_TABLET | Freq: Every day | ORAL | Status: DC
  Administered 2019-08-03: 11:00:00 100 mg via ORAL
  Filled 2019-08-03: qty 1

## 2019-08-03 MED ORDER — TRAZODONE HCL 50 MG PO TABS
50.0000 mg | ORAL_TABLET | Freq: Every evening | ORAL | Status: DC | PRN
  Administered 2019-08-05: 50 mg via ORAL
  Filled 2019-08-03 (×3): qty 1

## 2019-08-03 MED ORDER — DULOXETINE HCL 60 MG PO CPEP: 60.0000 mg | ORAL_CAPSULE | Freq: Every day | ORAL | Status: DC

## 2019-08-03 MED ORDER — TRAZODONE HCL 100 MG PO TABS: 200.0000 mg | ORAL_TABLET | Freq: Every day | ORAL | Status: DC

## 2019-08-03 MED ORDER — INFLUENZA VAC SPLIT QUAD 0.5 ML IM SUSY
0.5000 mL | PREFILLED_SYRINGE | INTRAMUSCULAR | Status: AC
  Administered 2019-08-04: 0.5 mL via INTRAMUSCULAR
  Filled 2019-08-03: qty 0.5

## 2019-08-03 MED ORDER — LORAZEPAM 2 MG/ML IJ SOLN: 0.0000 mg | Freq: Two times a day (BID) | INTRAMUSCULAR | Status: DC

## 2019-08-03 NOTE — Progress Notes (Signed)
[place] Group Notes:  (Nursing/MHT/Case Management/Adjunct)  Date:  08/03/2019  Time: 2030 Type of Therapy:  wrap up group  Participation Level:  Active  Participation Quality:  Appropriate, Attentive, Sharing and Supportive  Affect:  Appropriate  Cognitive:  Appropriate  Insight:  Improving  Engagement in Group:  Engaged  Modes of Intervention:  Clarification, Education and Support  Summary of Progress/Problems: Pt plans on working on gaining more self awareness and is grateful for his family support.   Shellia Cleverly 08/03/2019, 9:40 PM

## 2019-08-03 NOTE — ED Notes (Signed)
Pt signed consent form for [facility] - Copy faxed to [facility] - Copy sent to Medical Records - Original placed in envelope for [facility]. Pt states he will contact his wife once he gets to [facility].

## 2019-08-03 NOTE — ED Notes (Signed)
Regular Diet was ordered for Dinner. 

## 2019-08-03 NOTE — ED Notes (Signed)
Patient denies pain and is resting comfortably.  

## 2019-08-03 NOTE — ED Notes (Signed)
Regular Diet was ordered for Lunch. 

## 2019-08-03 NOTE — Progress Notes (Signed)
Patient has been accepted at [facility] adult unit pending labs, UDS, and covid-19 result. Call report after labs return to [phone] (pending results). Assigned bed 301-2. Informed Danny, TTS.

## 2019-08-03 NOTE — ED Notes (Addendum)
Safe Transport transporting pt to [place] belongings - 2 labeled belongings bags Energy manager. Pt aware. Pt allowed to put on hoodie per [facility] - will cut string when pt arrives. Pt wearing slides.

## 2019-08-03 NOTE — ED Notes (Signed)
Pt speaking with a [facility] counselor presently.

## 2019-08-03 NOTE — BH Assessment (Signed)
[place] Assessment Progress Note    Disposition: Once patient has been medically cleared and Covid tested with a negative result, he can be admitted to [facility] 301-2.  Ricky Ala, NP accepted patient and Dr. Mallie Darting will be attending.  Report will need to be called to [phone] prior to transport.

## 2019-08-03 NOTE — ED Notes (Signed)
Requested for Dr Tyrone Nine to order Potassium d/t pt's Potassium noted to be 3.1. Pt noted to be lying on bed w/eyes closed. Respirations even, unlabored. Snorous respirations noted.

## 2019-08-03 NOTE — Progress Notes (Signed)
Patient meets criteria for inpatient treatment. No available beds at [facility] currently. CSW faxed referrals to the following facilities for review:  [facility], [facility], [place] [place], [place], [place], [place], [place], [place], [place], [place], [place], [place], [place], [place].   TTS will continue to seek bed placement.   Maxie Better, MSW, LCSW Clinical Social Worker 08/03/2019 1:40 PM

## 2019-08-03 NOTE — ED Notes (Signed)
Pt arrived to Rm 49 - ambulatory - wearing hospital gown. Pt's 2 labeled belongings bags - inventoried previously - placed at nurses' desk d/t pt has been accepted to [facility] and will be transported after COVID-19 test results. Pt noted to be calm, pleasant, cooperative. Pt aware of tx plan and voices agreement.

## 2019-08-03 NOTE — Progress Notes (Signed)
Patient ID: Donald Carroll, male   DOB: 1966-05-02, 53 y.o.   MRN: [id number]   D: Pt alert and oriented during [facility] admission process. Pt denies SI/HI, A/VH, and any pain. Pt is cooperative.  A: Education, support, reassurance, and encouragement provided, q15 minute safety checks initiated. Pt's belongings in locker # 43.    "Donald Carroll is an 53 y.o. male who presented to [facility] with suicidal ideation and a plan to OD on Fentanyl.  He states that he had a Fentanyl overdose in August 2019.  Patient states that he was sober for a year and states that he was attending [employer] Meetings and utilizing his support prior to Covid.  He states that he found out that his wife spent all of their savings and he states that he got upset and states that he started drinking again a couple months ago.  He states that he has been drinking a fifth daily and states that he has been unable to stop drinking on his own.  He states that he is having withdrawal shakes and tremors currently.  Patient states that he "just don't want to be here anymore."  Patient states that he "just can't win."  Patient states that taking an overdose felt like an answer to him, but he states that de decided to see if getting help would improve his situation.  Patient states that he was at [facility] last year and he states that his PCP continued to writer his mental health medications prescriptions and patient states that he has been taking his medication as prescribed and it was working prior to his drinking.  Patient denies HI/Psychosis.  He states that he has experienced a loss of appetite and states that he has lost some weight, approximately 3 pounds.  He states that he has not slept in five days.  He denies any history of abuse or self-mutilation."  R: Pt denies any concerns at this time, and verbally contracts for safety. Pt ambulating on the unit with no issues. Pt remains safe on and off the unit.

## 2019-08-03 NOTE — ED Triage Notes (Addendum)
Pt presents from home for "mental health issues", has been drinking again (1/5th a day for 1 week, last drink 1hr), has not slept for 5 days, marijuana use, SI ("I was going to get some fentanyl, but now I feel like being here is a better plan."  H/o bipolar disorder, alcohol abuse and withdrawals,    Takes zyprexa 30mg , cymbalta 60mg , lisinopril, amlodipine, trazodone 100 mg, gabapentin 100mg , hydroxine, campral

## 2019-08-03 NOTE — ED Provider Notes (Signed)
[facility] Provider Note   CSN: [id number] Arrival date & time: 08/03/19  0944     History   Chief Complaint Chief Complaint  Patient presents with  . Suicidal    HPI Donald Carroll is a 53 y.o. male.     53 yo M with a chief complaint of suicidal ideation.  Going on for the past few days.  He feels that his mental illness is just worsened.  States has been taking his medications without improvement.  He also started drinking to try and help him with his depression.  Been going on for about a week.  Has required assistance to withdrawal from alcohol.  Has never had seizures or hallucinations upon withdrawal.  He denies cough congestion or fever denies chest pain shortness of breath abdominal pain vomiting or diarrhea.  Had a plan to obtain heroin and overdose.  Denies homicidal ideation.  The history is provided by the patient.  Illness Severity:  Moderate Onset quality:  Gradual Duration:  1 week Timing:  Constant Progression:  Worsening Chronicity:  New Associated symptoms: no abdominal pain, no chest pain, no congestion, no diarrhea, no fever, no headaches, no myalgias, no rash, no shortness of breath and no vomiting     Past Medical History:  Diagnosis Date  . Anxiety   . Depression   . Hypertension     Patient Active Problem List   Diagnosis Date Noted  . PTSD (post-traumatic stress disorder) 07/18/2018  . Alcohol use disorder, severe, dependence (HCC) 07/18/2018  . Bipolar I disorder, current or most recent episode depressed, with psychotic features (HCC) 07/17/2018    No past surgical history on file.      Home Medications    Prior to Admission medications   Medication Sig Start Date End Date Taking? Authorizing Provider  acamprosate (CAMPRAL) 333 MG tablet Take 2 tablets (666 mg total) by mouth 3 (three) times daily with meals. For alcohol cravings 07/22/18   Money, Gerlene Burdockravis B, FNP  amLODipine (NORVASC) 5 MG  tablet Take 1 tablet (5 mg total) by mouth daily. For high blood pressure 07/23/18   Money, Gerlene Burdockravis B, FNP  DULoxetine (CYMBALTA) 60 MG capsule Take 1 capsule (60 mg total) by mouth daily. For mood control 07/23/18   Money, Gerlene Burdockravis B, FNP  gabapentin (NEURONTIN) 400 MG capsule Take 1 capsule (400 mg total) by mouth 4 (four) times daily. For mood control and withdrawal symptoms 07/22/18   Money, Gerlene Burdockravis B, FNP  hydrOXYzine (ATARAX/VISTARIL) 50 MG tablet Take 1 tablet (50 mg total) by mouth every 6 (six) hours as needed for anxiety. 07/22/18   Money, Gerlene Burdockravis B, FNP  lisinopril (PRINIVIL,ZESTRIL) 40 MG tablet Take 1 tablet (40 mg total) by mouth daily. For high blood pressure 07/22/18   Money, Gerlene Burdockravis B, FNP  OLANZapine (ZYPREXA) 20 MG tablet Take 1 tablet (20 mg total) by mouth at bedtime. For mood control 07/22/18   Money, Gerlene Burdockravis B, FNP  Oxcarbazepine (TRILEPTAL) 300 MG tablet Take 1 tablet (300 mg total) by mouth 2 (two) times daily. For mood control 07/22/18   Money, Gerlene Burdockravis B, FNP  traZODone (DESYREL) 100 MG tablet Take 2 tablets (200 mg total) by mouth at bedtime. For mood control 07/22/18   Money, Gerlene Burdockravis B, FNP    Family History No family history on file.  Social History Social History   Tobacco Use  . Smoking status: Current Every Day Smoker    Packs/day: 1.00    Types:  Cigarettes  . Smokeless tobacco: Never Used  Substance Use Topics  . Alcohol use: Yes    Comment: drinking daily past month  . Drug use: Yes    Types: Heroin     Allergies   Patient has no known allergies.   Review of Systems Review of Systems  Constitutional: Negative for chills and fever.  HENT: Negative for congestion and facial swelling.   Eyes: Negative for discharge and visual disturbance.  Respiratory: Negative for shortness of breath.   Cardiovascular: Negative for chest pain and palpitations.  Gastrointestinal: Negative for abdominal pain, diarrhea and vomiting.  Musculoskeletal: Negative for arthralgias and  myalgias.  Skin: Negative for color change and rash.  Neurological: Negative for tremors, syncope and headaches.  Psychiatric/Behavioral: Positive for suicidal ideas. Negative for confusion, dysphoric mood, hallucinations and self-injury. The patient is nervous/anxious. The patient is not hyperactive.      Physical Exam Updated Vital Signs BP (!) 150/93   Pulse (!) 105   Temp 98.6 F (37 C) (Oral)   Resp 16   Ht 5\' 11"  (1.803 m)   Wt 90.7 kg   SpO2 96%   BMI 27.89 kg/m   Physical Exam Vitals signs and nursing note reviewed.  Constitutional:      Appearance: He is well-developed.  HENT:     Head: Normocephalic and atraumatic.  Eyes:     Pupils: Pupils are equal, round, and reactive to light.  Neck:     Musculoskeletal: Normal range of motion and neck supple.     Vascular: No JVD.  Cardiovascular:     Rate and Rhythm: Normal rate and regular rhythm.     Heart sounds: No murmur. No friction rub. No gallop.   Pulmonary:     Effort: No respiratory distress.     Breath sounds: No wheezing.  Abdominal:     General: There is no distension.     Tenderness: There is no abdominal tenderness. There is no guarding or rebound.  Musculoskeletal: Normal range of motion.  Skin:    Coloration: Skin is not pale.     Findings: No rash.  Neurological:     Mental Status: He is alert and oriented to person, place, and time.  Psychiatric:        Behavior: Behavior normal.      ED Treatments / Results  Labs (all labs ordered are listed, but only abnormal results are displayed) Labs Reviewed  COMPREHENSIVE METABOLIC PANEL - Abnormal; Notable for the following components:      Result Value   Sodium 133 (*)    Potassium 3.1 (*)    CO2 16 (*)    Glucose, Bld 216 (*)    All other components within normal limits  ETHANOL - Abnormal; Notable for the following components:   Alcohol, Ethyl (B) 57 (*)    All other components within normal limits  RAPID URINE DRUG SCREEN, HOSP PERFORMED  - Abnormal; Notable for the following components:   Tetrahydrocannabinol POSITIVE (*)    All other components within normal limits  CBC WITH DIFFERENTIAL/PLATELET - Abnormal; Notable for the following components:   WBC 13.3 (*)    Neutro Abs 9.6 (*)    Abs Immature Granulocytes 0.10 (*)    All other components within normal limits  SARS CORONAVIRUS 2 (TAT 6-24 HRS)    EKG None  Radiology No results found.  Procedures Procedures (including critical care time)  Medications Ordered in ED Medications  LORazepam (ATIVAN) injection 0-4 mg (  Intravenous See Alternative 08/03/19 1042)    Or  LORazepam (ATIVAN) tablet 0-4 mg (1 mg Oral Given 08/03/19 1042)  LORazepam (ATIVAN) injection 0-4 mg (has no administration in time range)    Or  LORazepam (ATIVAN) tablet 0-4 mg (has no administration in time range)  thiamine (VITAMIN B-1) tablet 100 mg (100 mg Oral Given 08/03/19 1043)    Or  thiamine (B-1) injection 100 mg ( Intravenous See Alternative 08/03/19 1043)  amLODipine (NORVASC) tablet 5 mg (5 mg Oral Given 08/03/19 1043)  DULoxetine (CYMBALTA) DR capsule 60 mg (60 mg Oral Not Given 08/03/19 1541)  gabapentin (NEURONTIN) capsule 400 mg (400 mg Oral Given 08/03/19 1518)  hydrOXYzine (ATARAX/VISTARIL) tablet 50 mg (has no administration in time range)  lisinopril (ZESTRIL) tablet 40 mg (40 mg Oral Given 08/03/19 1043)  OLANZapine (ZYPREXA) tablet 20 mg (has no administration in time range)  Oxcarbazepine (TRILEPTAL) tablet 300 mg (300 mg Oral Given 08/03/19 1043)  traZODone (DESYREL) tablet 200 mg (has no administration in time range)  potassium chloride SA (KLOR-CON) CR tablet 40 mEq (has no administration in time range)  potassium chloride SA (KLOR-CON) CR tablet 40 mEq (has no administration in time range)  magnesium oxide (MAG-OX) tablet 800 mg (has no administration in time range)  chlordiazePOXIDE (LIBRIUM) capsule 50 mg (50 mg Oral Given 08/03/19 1042)     Initial  Impression / Assessment and Plan / ED Course  I have reviewed the triage vital signs and the nursing notes.  Pertinent labs & imaging results that were available during my care of the patient were reviewed by me and considered in my medical decision making (see chart for details).        53 yo M with a chief complaint of suicidal ideation.  Had a plan to overdose with opioids.  States that he has been compliant with his medications.  Has started drinking heavily again over the past week.  Patient has no medical complaint.  He is medically clear for TTS evaluation.  Psych to admit.   The patients results and plan were reviewed and discussed.   Any x-rays performed were independently reviewed by myself.   Differential diagnosis were considered with the presenting HPI.  Medications  LORazepam (ATIVAN) injection 0-4 mg ( Intravenous See Alternative 08/03/19 1042)    Or  LORazepam (ATIVAN) tablet 0-4 mg (1 mg Oral Given 08/03/19 1042)  LORazepam (ATIVAN) injection 0-4 mg (has no administration in time range)    Or  LORazepam (ATIVAN) tablet 0-4 mg (has no administration in time range)  thiamine (VITAMIN B-1) tablet 100 mg (100 mg Oral Given 08/03/19 1043)    Or  thiamine (B-1) injection 100 mg ( Intravenous See Alternative 08/03/19 1043)  amLODipine (NORVASC) tablet 5 mg (5 mg Oral Given 08/03/19 1043)  DULoxetine (CYMBALTA) DR capsule 60 mg (60 mg Oral Not Given 08/03/19 1541)  gabapentin (NEURONTIN) capsule 400 mg (400 mg Oral Given 08/03/19 1518)  hydrOXYzine (ATARAX/VISTARIL) tablet 50 mg (has no administration in time range)  lisinopril (ZESTRIL) tablet 40 mg (40 mg Oral Given 08/03/19 1043)  OLANZapine (ZYPREXA) tablet 20 mg (has no administration in time range)  Oxcarbazepine (TRILEPTAL) tablet 300 mg (300 mg Oral Given 08/03/19 1043)  traZODone (DESYREL) tablet 200 mg (has no administration in time range)  potassium chloride SA (KLOR-CON) CR tablet 40 mEq (has no administration  in time range)  potassium chloride SA (KLOR-CON) CR tablet 40 mEq (has no administration in time range)  magnesium oxide (  MAG-OX) tablet 800 mg (has no administration in time range)  chlordiazePOXIDE (LIBRIUM) capsule 50 mg (50 mg Oral Given 08/03/19 1042)    Vitals:   08/03/19 0951 08/03/19 0958 08/03/19 1002  BP: (!) 150/93  (!) 150/93  Pulse: (!) 105  (!) 105  Resp: 16    Temp: 98.6 F (37 C)    TempSrc: Oral    SpO2: 96%    Weight:  90.7 kg   Height:   (1.803 m)     Final diagnoses:  Suicidal ideation      Final Clinical Impressions(s) / ED Diagnoses   Final diagnoses:  Suicidal ideation    ED Discharge Orders    None       Melene Plan, DO 08/03/19 1545

## 2019-08-03 NOTE — BH Assessment (Signed)
Tele Assessment Note   Patient Name: Donald Carroll MRN: [id number] Referring Physician: Deno Etienne Location of Patient: [facility] Location of Provider: [name] is an 53 y.o. male who presented to [facility] with suicidal ideation and a plan to OD on Fentanyl.  He states that he had a Fentanyl overdose in August 2019.  Patient states that he was sober for a year and states that he was attending [employer] and utilizing his support prior to Covid.  He states that he found out that his wife spent all of their savings and he states that he got upset and states that he started drinking again a couple months ago.  He states that he has been drinking a fifth daily and states that he has been unable to stop drinking on his own.  He states that he is having withdrawal shakes and tremors currently.  Patient states that he "just don't want to be here anymore."  Patient states that he "just can't win."  Patient states that taking an overdose felt like an answer to him, but he states that de decided to see if getting help would improve his situation.  Patient states that he was at [facility] last year and he states that his PCP continued to writer his mental health medications prescriptions and patient states that he has been taking his medication as prescribed and it was working prior to his drinking.  Patient denies HI/Psychosis.  He states that he has experienced a loss of appetite and states that he has lost some weight, approximately 3 pounds.  He states that he has not slept in five days.  He denies any history of abuse or self-mutilation.  Patient presented as alert and oriented.  His mood appeared to be depressed and his affect was flat.  Patient was experiencing moderate anxiety.  His judgment, insight and impulse control were impaired.  He did not appear to be responding to any internal stimuli.  His thoughts were organized and his memory was intact.  Patient could not contract for  safety to leave the hospital and stated, " I think I will do something to harm myself if I have to leave the hospital."  Diagnosis: F33.2 MDD Recurrent Severe without psychosis, F10.20 Alcohol Use Disorder Severe  Past Medical History:  Past Medical History:  Diagnosis Date  . Anxiety   . Depression   . Hypertension     No past surgical history on file.  Family History: No family history on file.  Social History:  reports that he has been smoking cigarettes. He has been smoking about 1.00 pack per day. He has never used smokeless tobacco. He reports current alcohol use. He reports current drug use. Drug: Heroin.  Additional Social History:  Alcohol / Drug Use Pain Medications: see MAR Prescriptions: see MAR Over the Counter: see MAR History of alcohol / drug use?: Yes Longest period of sobriety (when/how long): 1 year clean Substance #1 Name of Substance 1: alcohol 1 - Age of First Use: 14 1 - Amount (size/oz): fifth 1 - Frequency: daily 1 - Duration: past couple months 1 - Last Use / Amount: drank a "tall boy" today  CIWA: CIWA-Ar BP: (!) 150/93 Pulse Rate: (!) 105 Nausea and Vomiting: no nausea and no vomiting Tactile Disturbances: none Tremor: two Auditory Disturbances: not present Paroxysmal Sweats: no sweat visible Visual Disturbances: not present Anxiety: moderately anxious, or guarded, so anxiety is inferred Headache, Fullness in Head: none present  Agitation: normal activity Orientation and Clouding of Sensorium: oriented and can do serial additions CIWA-Ar Total: 6 COWS:    Allergies: No Known Allergies  Home Medications: (Not in a hospital admission)   OB/GYN Status:  No LMP for male patient.  General Assessment Data [facility] Assessment: In system Is this a Tele or Face-to-Face Assessment?: Tele Assessment Is this an Initial Assessment or a Re-assessment for this encounter?: Initial Assessment Patient Accompanied by::  N/A Language Other than English: No Living Arrangements: Other (Comment)(patient and wife live with another couple) What gender do you identify as?: Male Marital status: Married Living Arrangements: Non-relatives/Friends Can pt return to current living arrangement?: Yes Admission Status: Voluntary Is patient capable of signing voluntary admission?: Yes Referral Source: Self/Family/Friend Insurance type: Self-pay     Crisis Care Plan Living Arrangements: Non-relatives/Friends Legal Guardian: Other:(self) Name of Psychiatrist: (none) Name of Therapist: none  Education Status Is patient currently in school?: No Is the patient employed, unemployed or receiving disability?: Unemployed  Risk to self with the past 6 months Suicidal Ideation: Yes-Currently Present Has patient been a risk to self within the past 6 months prior to admission? : No Suicidal Intent: Yes-Currently Present Has patient had any suicidal intent within the past 6 months prior to admission? : No Is patient at risk for suicide?: Yes Suicidal Plan?: Yes-Currently Present Has patient had any suicidal plan within the past 6 months prior to admission? : No Specify Current Suicidal Plan: OD on Fentanyl Access to Means: Yes Specify Access to Suicidal Means: can buy Fentanyl off street What has been your use of drugs/alcohol within the last 12 months?: daily alcohol use Previous Attempts/Gestures: Yes How many times?: 1(OD on Fentanyl last year) Other Self Harm Risks: none Triggers for Past Attempts: None known Intentional Self Injurious Behavior: None Family Suicide History: No Recent stressful life event(s): Other (Comment)(pt states that wife spent savings) Persecutory voices/beliefs?: No Depression: Yes Depression Symptoms: Despondent, Insomnia, Isolating, Loss of interest in usual pleasures, Feeling worthless/self pity Substance abuse history and/or treatment for substance abuse?: Yes Suicide prevention  information given to non-admitted patients: Not applicable  Risk to Others within the past 6 months Homicidal Ideation: No Does patient have any lifetime risk of violence toward others beyond the six months prior to admission? : No Thoughts of Harm to Others: No Current Homicidal Intent: No Current Homicidal Plan: No Access to Homicidal Means: No Identified Victim: none History of harm to others?: No Assessment of Violence: None Noted Violent Behavior Description: none Does patient have access to weapons?: Yes (Comment)(shot gun in home where he lives) Criminal Charges Pending?: No Does patient have a court date: No Is patient on probation?: No  Psychosis Hallucinations: None noted Delusions: None noted  Mental Status Report Appearance/Hygiene: Disheveled Eye Contact: Good Motor Activity: Tremors(see ciwa) Speech: Logical/coherent Level of Consciousness: Alert Mood: Depressed, Apathetic Affect: Flat Anxiety Level: Moderate Thought Processes: Coherent, Relevant Judgement: Impaired Orientation: Person, Place, Time, Situation Obsessive Compulsive Thoughts/Behaviors: None  Cognitive Functioning Concentration: Decreased Memory: Recent Intact, Remote Intact Is patient IDD: No Insight: Poor Impulse Control: Poor Appetite: Fair Have you had any weight changes? : Loss Amount of the weight change? (lbs): 3 lbs Sleep: Decreased Total Hours of Sleep: (states that he has not slept in several days) Vegetative Symptoms: Decreased grooming  ADLScreening [facility] Assessment Services) Patient's cognitive ability adequate to safely complete daily activities?: Yes Patient able to express need for assistance with ADLs?: Yes Independently performs ADLs?:  Yes (appropriate for developmental age)  Prior Inpatient Therapy Prior Inpatient Therapy: Yes Prior Therapy Dates: 05/2018 Prior Therapy Facilty/Provider(s): [facility] Reason for Treatment: OD  Prior Outpatient Therapy Prior Outpatient  Therapy: No Does patient have an ACCT team?: No Does patient have Intensive In-House Services?  : No Does patient have [facility] services? : No Does patient have P4CC services?: No  ADL Screening (condition at time of admission) Patient's cognitive ability adequate to safely complete daily activities?: Yes Is the patient deaf or have difficulty hearing?: No Does the patient have difficulty seeing, even when wearing glasses/contacts?: No Does the patient have difficulty concentrating, remembering, or making decisions?: No Patient able to express need for assistance with ADLs?: Yes Does the patient have difficulty dressing or bathing?: No Independently performs ADLs?: Yes (appropriate for developmental age) Does the patient have difficulty walking or climbing stairs?: No Weakness of Legs: None Weakness of Arms/Hands: None  Home Assistive Devices/Equipment Home Assistive Devices/Equipment: None  Therapy Consults (therapy consults require a physician order) PT Evaluation Needed: No OT Evalulation Needed: No SLP Evaluation Needed: No Abuse/Neglect Assessment (Assessment to be complete while patient is alone) Abuse/Neglect Assessment Can Be Completed: Yes Physical Abuse: Denies Verbal Abuse: Denies Sexual Abuse: Denies Exploitation of patient/patient's resources: Denies Self-Neglect: Denies Values / Beliefs Cultural Requests During Hospitalization: None Spiritual Requests During Hospitalization: None Consults Spiritual Care Consult Needed: No Social Work Consult Needed: No Merchant navy officer (For Healthcare) Does Patient Have a Medical Advance Directive?: No Would patient like information on creating a medical advance directive?: No - Patient declined Nutrition Screen- [facility] Adult/[facility]/AP Has the patient recently lost weight without trying?: Yes, 2-13 lbs. Has the patient been eating poorly because of a decreased appetite?: Yes Malnutrition Screening Tool Score: 2         Disposition: Once patient has been medically cleared and Covid tested with a negative result, he can be admitted to [facility].  Hillery Jacks, NP accepted patient and Dr. Jola Babinski will be attending.  Report will need to be called to [phone] prior to transport.   Disposition Initial Assessment Completed for this Encounter: Yes  This service was provided via telemedicine using a 2-way, interactive audio and video technology.  Names of all persons participating in this telemedicine service and their role in this encounter. Name: Donald Carroll Role: patient  Name: Dannielle Huh Vermelle Cammarata Role: TTS  Name:  Role:   Name:  Role:     Daphene Calamity 08/03/2019 10:51 AM

## 2019-08-03 NOTE — ED Notes (Signed)
Pt tolerating PO fluids and medication well

## 2019-08-03 NOTE — Tx Team (Signed)
Initial Treatment Plan 08/03/2019 7:53 PM Donald Carroll [id number]:[id number]    PATIENT STRESSORS: Medication change or noncompliance Substance abuse   PATIENT STRENGTHS: Motivation for treatment/growth Supportive family/friends   PATIENT IDENTIFIED PROBLEMS: Substance abuse  "Depression"  "Anger"  "Trouble sleeping"               DISCHARGE CRITERIA:  Improved stabilization in mood, thinking, and/or behavior Verbal commitment to aftercare and medication compliance Withdrawal symptoms are absent or subacute and managed without 24-hour nursing intervention  PRELIMINARY DISCHARGE PLAN: Outpatient therapy Return to previous living arrangement Return to previous work or school arrangements  PATIENT/FAMILY INVOLVEMENT: This treatment plan has been presented to and reviewed with the patient, Donald Carroll, and/or family member.  The patient and family have been given the opportunity to ask questions and make suggestions.  Harriet Masson, RN 08/03/2019, 7:53 PM

## 2019-08-03 NOTE — Progress Notes (Signed)
Patient ID: AMADEUS OYAMA, male   DOB: Dec 16, 1965, 53 y.o.   MRN: [id number]   [place] NOVEL CORONAVIRUS (COVID-19) DAILY CHECK-OFF SYMPTOMS - answer yes or no to each - every day NO YES  Have you had a fever in the past 24 hours?  . Fever (Temp > 37.80C / 100F) X   Have you had any of these symptoms in the past 24 hours? . New Cough .  Sore Throat  .  Shortness of Breath .  Difficulty Breathing .  Unexplained Body Aches   X   Have you had any one of these symptoms in the past 24 hours not related to allergies?   . Runny Nose .  Nasal Congestion .  Sneezing   X   If you have had runny nose, nasal congestion, sneezing in the past 24 hours, has it worsened?  X   EXPOSURES - check yes or no X   Have you traveled outside the state in the past 14 days?  X   Have you been in contact with someone with a confirmed diagnosis of COVID-19 or PUI in the past 14 days without wearing appropriate PPE?  X   Have you been living in the same home as a person with confirmed diagnosis of COVID-19 or a PUI (household contact)?    X   Have you been diagnosed with COVID-19?    X              What to do next: Answered NO to all: Answered YES to anything:   Proceed with unit schedule Follow the [facility] Inpatient Flowsheet.

## 2019-08-04 DIAGNOSIS — F1024 Alcohol dependence with alcohol-induced mood disorder: Secondary | ICD-10-CM

## 2019-08-04 DIAGNOSIS — F11259 Opioid dependence with opioid-induced psychotic disorder, unspecified: Secondary | ICD-10-CM

## 2019-08-04 DIAGNOSIS — F4310 Post-traumatic stress disorder, unspecified: Secondary | ICD-10-CM

## 2019-08-04 DIAGNOSIS — F1994 Other psychoactive substance use, unspecified with psychoactive substance-induced mood disorder: Secondary | ICD-10-CM

## 2019-08-04 DIAGNOSIS — F319 Bipolar disorder, unspecified: Principal | ICD-10-CM

## 2019-08-04 DIAGNOSIS — F10230 Alcohol dependence with withdrawal, uncomplicated: Secondary | ICD-10-CM

## 2019-08-04 LAB — HEMOGLOBIN A1C
Hgb A1c MFr Bld: 5.1 % (ref 4.8–5.6)
Mean Plasma Glucose: 99.67 mg/dL

## 2019-08-04 LAB — TSH: TSH: 3.218 u[IU]/mL (ref 0.350–4.500)

## 2019-08-04 MED ORDER — POTASSIUM CHLORIDE CRYS ER 20 MEQ PO TBCR
20.0000 meq | EXTENDED_RELEASE_TABLET | Freq: Once | ORAL | Status: AC
  Administered 2019-08-04: 20 meq via ORAL
  Filled 2019-08-04 (×2): qty 1

## 2019-08-04 MED ORDER — OLANZAPINE 5 MG PO TABS
5.0000 mg | ORAL_TABLET | Freq: Every day | ORAL | Status: DC
  Filled 2019-08-04 (×2): qty 1

## 2019-08-04 MED ORDER — OXCARBAZEPINE 150 MG PO TABS
150.0000 mg | ORAL_TABLET | Freq: Two times a day (BID) | ORAL | Status: DC
  Administered 2019-08-04 – 2019-08-05 (×3): 150 mg via ORAL
  Filled 2019-08-04 (×6): qty 1

## 2019-08-04 MED ORDER — DULOXETINE HCL 30 MG PO CPEP
30.0000 mg | ORAL_CAPSULE | Freq: Every day | ORAL | Status: DC
  Administered 2019-08-04 – 2019-08-05 (×2): 30 mg via ORAL
  Filled 2019-08-04 (×4): qty 1

## 2019-08-04 MED ORDER — LISINOPRIL 5 MG PO TABS
5.0000 mg | ORAL_TABLET | Freq: Every day | ORAL | Status: DC
  Administered 2019-08-04: 5 mg via ORAL
  Filled 2019-08-04 (×4): qty 1

## 2019-08-04 MED ORDER — GABAPENTIN 300 MG PO CAPS
300.0000 mg | ORAL_CAPSULE | Freq: Three times a day (TID) | ORAL | Status: DC
  Administered 2019-08-04 – 2019-08-07 (×8): 300 mg via ORAL
  Filled 2019-08-04 (×12): qty 1

## 2019-08-04 MED ORDER — NICOTINE 21 MG/24HR TD PT24
21.0000 mg | MEDICATED_PATCH | Freq: Every day | TRANSDERMAL | Status: DC
  Administered 2019-08-04 – 2019-08-11 (×8): 21 mg via TRANSDERMAL
  Filled 2019-08-04 (×10): qty 1

## 2019-08-04 NOTE — H&P (Signed)
Psychiatric Admission Assessment Adult  Patient Identification: Donald Carroll MRN:  [id number] Date of Evaluation:  08/04/2019 Chief Complaint:  MDD Substance Abuse Principal Diagnosis: <principal problem not specified> Diagnosis:  Active Problems:   MDD (major depressive disorder)  History of Present Illness: Patient is seen and examined.  Patient is a 53 year old male with a past psychiatric history significant for alcohol dependence, opiate dependence and a reported history of PTSD and bipolar disorder who presented to the [facility] emergency department on 08/03/2019 with suicidal ideation.  The patient stated that he had been sober for approximately 4 months.  He stated that approximately 2 weeks ago he had relapsed again.  He began to drink, and use drugs.  The patient stated that after his hospitalization in September 2019 he went to a residential substance abuse program for approximately 6 months.  He remained sober for 4 months after he was released from that facility.  He stated that he and his wife have been having problems, and that led to his relapse.  He is requesting to "get back on my medicines".  As stated above, his last hospitalization here was on 07/17/2018.  He remained in the hospital until 07/24/2018.  His discharge medications included acamprosate, Cymbalta, gabapentin, hydroxyzine, Zyprexa, Trileptal and trazodone.  He stated that he had been taking his medications, although he had not followed up with psychiatry.  He stated that his primary care provider was writing these medications for him.  He was admitted to the hospital for evaluation and stabilization.  Associated Signs/Symptoms: Depression Symptoms:  depressed mood, anhedonia, insomnia, fatigue, feelings of worthlessness/guilt, difficulty concentrating, suicidal thoughts without plan, anxiety, loss of energy/fatigue, disturbed sleep, (Hypo) Manic Symptoms:  Impulsivity, Irritable  Mood, Labiality of Mood, Anxiety Symptoms:  Excessive Worry, Psychotic Symptoms:  : Denied PTSD Symptoms: Had a traumatic exposure:  In the past Total Time spent with patient: 30 minutes  Past Psychiatric History: Patient has the above-stated past psychiatric history he has been admitted to the hospital on several occasions.  He has been previously diagnosed with posttraumatic stress disorder, bipolar disorder, anxiety, depression and polysubstance dependence.  This would be his fifth inpatient admission.  He has been seen as an outpatient at the [facility].  He reportedly is attempted to kill himself x3 with overdoses of heroin.  Is the patient at risk to self? Yes.    Has the patient been a risk to self in the past 6 months? Yes.    Has the patient been a risk to self within the distant past? Yes.    Is the patient a risk to others? No.  Has the patient been a risk to others in the past 6 months? No.  Has the patient been a risk to others within the distant past? No.   Prior Inpatient Therapy:   Prior Outpatient Therapy:    Alcohol Screening: 1. How often do you have a drink containing alcohol?: 4 or more times a week 2. How many drinks containing alcohol do you have on a typical day when you are drinking?: 3 or 4 3. How often do you have six or more drinks on one occasion?: Daily or almost daily AUDIT-C Score: 9 4. How often during the last year have you found that you were not able to stop drinking once you had started?: Weekly 5. How often during the last year have you failed to do what was normally expected from you becasue of drinking?: Weekly 6. How often during  the last year have you needed a first drink in the morning to get yourself going after a heavy drinking session?: Weekly 7. How often during the last year have you had a feeling of guilt of remorse after drinking?: Weekly 8. How often during the last year have you been unable to remember what happened the night before because you  had been drinking?: Weekly 9. Have you or someone else been injured as a result of your drinking?: No 10. Has a relative or friend or a doctor or another health worker been concerned about your drinking or suggested you cut down?: Yes, during the last year Alcohol Use Disorder Identification Test Final Score (AUDIT): 28 Alcohol Brief Interventions/Follow-up: Medication Offered/Prescribed, Continued Monitoring, Alcohol Education Substance Abuse History in the last 12 months:  Yes.   Consequences of Substance Abuse: Medical Consequences:  Several psychiatric hospitalizations as well as rehab stays. Withdrawal Symptoms:   Cramps Diaphoresis Diarrhea Headaches Nausea Tremors Vomiting Previous Psychotropic Medications: Yes  Psychological Evaluations: Yes  Past Medical History:  Past Medical History:  Diagnosis Date  . Anxiety   . Depression   . Hypertension    History reviewed. No pertinent surgical history. Family History: History reviewed. No pertinent family history. Family Psychiatric  History: Noncontributory Tobacco Screening:   Social History:  Social History   Substance and Sexual Activity  Alcohol Use Yes   Comment: drinking daily past month     Social History   Substance and Sexual Activity  Drug Use Yes  . Types: Heroin    Additional Social History:                           Allergies:  No Known Allergies Lab Results:  Results for orders placed or performed during the hospital encounter of 08/03/19 (from the past 48 hour(s))  TSH     Status: None   Collection Time: 08/04/19  6:25 AM  Result Value Ref Range   TSH 3.218 0.350 - 4.500 uIU/mL    Comment: Performed by a 3rd Generation assay with a functional sensitivity of <=0.01 uIU/mL. Performed at [facility], [place] [address] [place], [place] [address]     Blood Alcohol level:  Lab Results  Component Value Date   ETH 57 (H) 08/03/2019   ETH 173 (H) 19/37/9024     Metabolic Disorder Labs:  Lab Results  Component Value Date   HGBA1C 4.6 (L) 07/18/2018   MPG 85.32 07/18/2018   No results found for: PROLACTIN Lab Results  Component Value Date   CHOL 179 07/18/2018   TRIG 359 (H) 07/18/2018   HDL 37 (L) 07/18/2018   CHOLHDL 4.8 07/18/2018   VLDL 72 (H) 07/18/2018   LDLCALC 70 07/18/2018    Current Medications: Current Facility-Administered Medications  Medication Dose Route Frequency Provider Last Rate Last Dose  . acetaminophen (TYLENOL) tablet 650 mg  650 mg Oral Q6H PRN [name], NP      . alum & mag hydroxide-simeth (MAALOX/MYLANTA) 200-200-20 MG/5ML suspension 30 mL  30 mL Oral Q4H PRN [name], NP      . DULoxetine (CYMBALTA) DR capsule 30 mg  30 mg Oral Daily [name], MD   30 mg at 08/04/19 0858  . hydrOXYzine (ATARAX/VISTARIL) tablet 25 mg  25 mg Oral Q6H PRN [name], NP   25 mg at 08/04/19 0941  . lisinopril (ZESTRIL) tablet 5 mg  5 mg Oral  Daily Antonieta Pertlary, Ornella Coderre Lawson, MD   5 mg at 08/04/19 0858  . loperamide (IMODIUM) capsule 2-4 mg  2-4 mg Oral PRN Oneta RackLewis, Tanika N, NP      . LORazepam (ATIVAN) tablet 1 mg  1 mg Oral Q6H PRN Oneta RackLewis, Tanika N, NP      . magnesium hydroxide (MILK OF MAGNESIA) suspension 30 mL  30 mL Oral Daily PRN Oneta RackLewis, Tanika N, NP      . multivitamin with minerals tablet 1 tablet  1 tablet Oral Daily Oneta RackLewis, Tanika N, NP   1 tablet at 08/04/19 0858  . OLANZapine (ZYPREXA) tablet 5 mg  5 mg Oral QHS Antonieta Pertlary, Tymeir Weathington Lawson, MD      . ondansetron (ZOFRAN-ODT) disintegrating tablet 4 mg  4 mg Oral Q6H PRN Oneta RackLewis, Tanika N, NP      . OXcarbazepine (TRILEPTAL) tablet 150 mg  150 mg Oral BID Antonieta Pertlary, Ajaya Crutchfield Lawson, MD   150 mg at 08/04/19 0858  . thiamine (B-1) injection 100 mg  100 mg Intramuscular Once Oneta RackLewis, Tanika N, NP      . thiamine (VITAMIN B-1) tablet 100 mg  100 mg Oral Daily Oneta RackLewis, Tanika N, NP   100 mg at 08/04/19 0859  . traZODone (DESYREL) tablet 50 mg  50 mg Oral QHS PRN Oneta RackLewis, Tanika  N, NP       PTA Medications: Medications Prior to Admission  Medication Sig Dispense Refill Last Dose  . acamprosate (CAMPRAL) 333 MG tablet Take 2 tablets (666 mg total) by mouth 3 (three) times daily with meals. For alcohol cravings 180 tablet 0   . amLODipine (NORVASC) 5 MG tablet Take 1 tablet (5 mg total) by mouth daily. For high blood pressure 30 tablet 0   . DULoxetine (CYMBALTA) 60 MG capsule Take 1 capsule (60 mg total) by mouth daily. For mood control 30 capsule 0   . gabapentin (NEURONTIN) 400 MG capsule Take 1 capsule (400 mg total) by mouth 4 (four) times daily. For mood control and withdrawal symptoms 120 capsule 0   . hydrOXYzine (ATARAX/VISTARIL) 50 MG tablet Take 1 tablet (50 mg total) by mouth every 6 (six) hours as needed for anxiety. 30 tablet 0   . lisinopril (PRINIVIL,ZESTRIL) 40 MG tablet Take 1 tablet (40 mg total) by mouth daily. For high blood pressure 30 tablet 0   . OLANZapine (ZYPREXA) 20 MG tablet Take 1 tablet (20 mg total) by mouth at bedtime. For mood control 30 tablet 0   . Oxcarbazepine (TRILEPTAL) 300 MG tablet Take 1 tablet (300 mg total) by mouth 2 (two) times daily. For mood control 60 tablet 0   . traZODone (DESYREL) 100 MG tablet Take 2 tablets (200 mg total) by mouth at bedtime. For mood control 60 tablet 0     Musculoskeletal: Strength & Muscle Tone: within normal limits Gait & Station: normal Patient leans: N/A  Psychiatric Specialty Exam: Physical Exam  Nursing note and vitals reviewed. Constitutional: He is oriented to person, place, and time. He appears well-developed and well-nourished.  HENT:  Head: Normocephalic and atraumatic.  Respiratory: Effort normal.  Neurological: He is alert and oriented to person, place, and time.    ROS  Blood pressure (!) 133/96, pulse 71, temperature 97.7 F (36.5 C), temperature source Oral, resp. rate 18, height 5\' 11"  (1.803 m), weight 90.7 kg, SpO2 100 %.Body mass index is 27.89 kg/m.  General  Appearance: Disheveled  Eye Contact:  Fair  Speech:  Normal Rate  Volume:  Normal  Mood:  Anxious, Depressed and Dysphoric  Affect:  Congruent  Thought Process:  Coherent and Descriptions of Associations: Circumstantial  Orientation:  Full (Time, Place, and Person)  Thought Content:  Logical  Suicidal Thoughts:  Yes.  without intent/plan  Homicidal Thoughts:  No  Memory:  Immediate;   Fair Recent;   Fair Remote;   Fair  Judgement:  Impaired  Insight:  Lacking  Psychomotor Activity:  Increased  Concentration:  Concentration: Fair and Attention Span: Fair  Recall:  [employer] of Knowledge:  Fair  Language:  Fair  Akathisia:  Negative  Handed:  Right  AIMS (if indicated):     Assets:  Desire for Improvement Resilience  ADL's:  Intact  Cognition:  WNL  Sleep:  Number of Hours: 6.75    Treatment Plan Summary: Daily contact with patient to assess and evaluate symptoms and progress in treatment, Medication management and Plan : Patient is seen and examined.  Patient is a 53 year old male with the above-stated past psychiatric history who presented to the [facility] emergency department on 08/03/2019 with suicidal ideation.  He will be admitted to the hospital.  He will be integrated into the milieu.  He will be encouraged to attend groups.  He will be placed on the opiate as well as alcohol related detox protocols.  He will receive lorazepam 1 mg p.o. 4 times daily PRN a CIWA greater than 10.  He will also be given folic acid as well as thiamine.  His hospitalization in 2019 seem to be in similar circumstances.  He wanted to overdose on opiates at that time and kill himself as well.  At that time he also wanted to go back on his medications, and was restarted at a lower dosage.  We will do that as well.  I will restart his Cymbalta at 30 mg p.o. daily.  His liver function enzymes are normal, and I will restart the Trileptal as well as the olanzapine at lower dosages.  He is  hoping to go to a residential substance abuse program, and I encouraged him to start getting on the telephone today to work on that.  His blood pressures mildly elevated today at 133/96.  He is afebrile.  Review of his laboratories revealed a mildly low potassium and an elevated glucose of 216.  His white blood cell count was mildly elevated at 13.3.  He does not appear that he has a history of diabetes, but we will get a hemoglobin A1c and make sure about that.  He was taking lisinopril for hypertension, and that will be restarted at a lower dosage as well.  Observation Level/Precautions:  Detox 15 minute checks  Laboratory:  Chemistry Profile  Psychotherapy:    Medications:    Consultations:    Discharge Concerns:    Estimated LOS:  Other:     Physician Treatment Plan for Primary Diagnosis: <principal problem not specified> Long Term Goal(s): Improvement in symptoms so as ready for discharge  Short Term Goals: Ability to identify changes in lifestyle to reduce recurrence of condition will improve, Ability to verbalize feelings will improve, Ability to disclose and discuss suicidal ideas, Ability to demonstrate self-control will improve, Ability to identify and develop effective coping behaviors will improve, Ability to maintain clinical measurements within normal limits will improve, Compliance with prescribed medications will improve and Ability to identify triggers associated with substance abuse/mental health issues will improve  Physician Treatment Plan for Secondary Diagnosis: Active Problems:   MDD (  major depressive disorder)  Long Term Goal(s): Improvement in symptoms so as ready for discharge  Short Term Goals: Ability to identify changes in lifestyle to reduce recurrence of condition will improve, Ability to verbalize feelings will improve, Ability to disclose and discuss suicidal ideas, Ability to demonstrate self-control will improve, Ability to identify and develop effective  coping behaviors will improve, Ability to maintain clinical measurements within normal limits will improve, Compliance with prescribed medications will improve and Ability to identify triggers associated with substance abuse/mental health issues will improve  I certify that inpatient services furnished can reasonably be expected to improve the patient's condition.    Antonieta Pert, MD 10/11/202011:54 AM

## 2019-08-04 NOTE — [facility] Suicide Risk Assessment (Signed)
[facility] Admission Suicide Risk Assessment   Nursing information obtained from:  Patient Demographic factors:  Male, Caucasian Current Mental Status:  Suicidal ideation indicated by patient Loss Factors:  NA Historical Factors:  Prior suicide attempts, Impulsivity Risk Reduction Factors:  Living with another person, especially a relative, Positive social support, Positive therapeutic relationship  Total Time spent with patient: 20 minutes Principal Problem: <principal problem not specified> Diagnosis:  Active Problems:   MDD (major depressive disorder)  Subjective Data: Patient is seen and examined.  Patient is a 53 year old male with a past psychiatric history significant for alcohol dependence, opiate dependence and a reported history of PTSD and bipolar disorder who presented to the [facility] emergency department on 08/03/2019 with suicidal ideation.  The patient stated that he had been sober for approximately 4 months.  He stated that approximately 2 weeks ago he had relapsed again.  He began to drink, and use drugs.  The patient stated that after his hospitalization in September 2019 he went to a residential substance abuse program for approximately 6 months.  He remained sober for 4 months after he was released from that facility.  He stated that he and his wife have been having problems, and that led to his relapse.  He is requesting to "get back on my medicines".  As stated above, his last hospitalization here was on 07/17/2018.  He remained in the hospital until 07/24/2018.  His discharge medications included acamprosate, Cymbalta, gabapentin, hydroxyzine, Zyprexa, Trileptal and trazodone.  He stated that he had been taking his medications, although he had not followed up with psychiatry.  He stated that his primary care provider was writing these medications for him.  He was admitted to the hospital for evaluation and stabilization.  Continued Clinical Symptoms:  Alcohol Use  Disorder Identification Test Final Score (AUDIT): 28 The "Alcohol Use Disorders Identification Test", Guidelines for Use in Primary Care, Second Edition.  World Science writer [facility]). Score between 0-7:  no or low risk or alcohol related problems. Score between 8-15:  moderate risk of alcohol related problems. Score between 16-19:  high risk of alcohol related problems. Score 20 or above:  warrants further diagnostic evaluation for alcohol dependence and treatment.   CLINICAL FACTORS:   Bipolar Disorder:   Depressive phase Depression:   Anhedonia Comorbid alcohol abuse/dependence Hopelessness Impulsivity Insomnia Alcohol/Substance Abuse/Dependencies   Musculoskeletal: Strength & Muscle Tone: within normal limits Gait & Station: normal Patient leans: N/A  Psychiatric Specialty Exam: Physical Exam  Nursing note and vitals reviewed. Constitutional: He is oriented to person, place, and time. He appears well-developed and well-nourished.  HENT:  Head: Normocephalic and atraumatic.  Respiratory: Effort normal.  Neurological: He is alert and oriented to person, place, and time.    ROS  Blood pressure (!) 133/96, pulse 71, temperature 97.7 F (36.5 C), temperature source Oral, resp. rate 18, height 5\' 11"  (1.803 m), weight 90.7 kg, SpO2 100 %.Body mass index is 27.89 kg/m.  General Appearance: Disheveled  Eye Contact:  Fair  Speech:  Normal Rate  Volume:  Decreased  Mood:  Depressed  Affect:  Congruent  Thought Process:  Coherent and Descriptions of Associations: Circumstantial  Orientation:  Full (Time, Place, and Person)  Thought Content:  Logical  Suicidal Thoughts:  Yes.  without intent/plan  Homicidal Thoughts:  No  Memory:  Immediate;   Fair Recent;   Fair Remote;   Fair  Judgement:  Intact  Insight:  Lacking  Psychomotor Activity:  Increased  Concentration:  Concentration: Fair and Attention Span: Fair  Recall:  [employer] of Knowledge:  Fair  Language:   Good  Akathisia:  Negative  Handed:  Right  AIMS (if indicated):     Assets:  Desire for Improvement Resilience  ADL's:  Intact  Cognition:  WNL  Sleep:  Number of Hours: 6.75      COGNITIVE FEATURES THAT CONTRIBUTE TO RISK:  None    SUICIDE RISK:   Minimal: No identifiable suicidal ideation.  Patients presenting with no risk factors but with morbid ruminations; may be classified as minimal risk based on the severity of the depressive symptoms  PLAN OF CARE: Patient is seen and examined.  Patient is a 53 year old male with the above-stated past psychiatric history who presented to the [facility] emergency department on 08/03/2019 with suicidal ideation.  He will be admitted to the hospital.  He will be integrated into the milieu.  He will be encouraged to attend groups.  He will be placed on the opiate as well as alcohol related detox protocols.  He will receive lorazepam 1 mg p.o. 4 times daily PRN a CIWA greater than 10.  He will also be given folic acid as well as thiamine.  His hospitalization in 2019 seem to be in similar circumstances.  He wanted to overdose on opiates at that time and kill himself as well.  At that time he also wanted to go back on his medications, and was restarted at a lower dosage.  We will do that as well.  I will restart his Cymbalta at 30 mg p.o. daily.  His liver function enzymes are normal, and I will restart the Trileptal as well as the olanzapine at lower dosages.  He is hoping to go to a residential substance abuse program, and I encouraged him to start getting on the telephone today to work on that.  His blood pressures mildly elevated today at 133/96.  He is afebrile.  Review of his laboratories revealed a mildly low potassium and an elevated glucose of 216.  His white blood cell count was mildly elevated at 13.3.  He does not appear that he has a history of diabetes, but we will get a hemoglobin A1c and make sure about that.  He was taking  lisinopril for hypertension, and that will be restarted at a lower dosage as well.  I certify that inpatient services furnished can reasonably be expected to improve the patient's condition.   [name], MD 08/04/2019, 8:22 AM
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
## 2019-08-04 NOTE — Progress Notes (Signed)
Pt presented with a flat affect and a depressed mood. Pt reported increased depression and anxiety this am. Pt endorsed SI with no plan or intent. Pt verbally contracted for safety. Pt reported difficulty sleeping last night due to racing thoughts. Pt reported feeling on edge this morning because he haven't been started on his home medications yet. Writer advised the pt that the psychiatrist will review medications with the pt during their encounter.   Orders reviewed with pt. Verbal support provided. Pt encouraged to attend groups. 15 minute checks performed for safety.  Pt compliant with tx plan.

## 2019-08-04 NOTE — [facility] Group Notes (Signed)
[place] LCSW Group Therapy Note  08/04/2019  10:00-11:00AM  Type of Therapy and Topic:  Group Therapy:  Adding Supports Including Yourself  Participation Level:  Active   Description of Group:  Patients in this group were introduced to the concept that additional supports including self-support are an essential part of recovery.  Patients listed what supports they believe they need to add to their lives to achieve their goals at discharge, and they listed such things as therapist, family, doctor, support groups, and more.   A song entitled "My Own Hero" was played and a group discussion ensued in which patients stated they could relate to the song and it inspired them to realize they have be willing to help themselves in order to succeed, because other people cannot achieve their goals such as sobriety or stability for them.  A song was played called "I Am Enough" which led to a discussion about being willing to believe we are worth the effort of being a self-support.   Additionally, a group member requested that a song named OCD by artist Logic be played, feeling it has a positive message others would enjoy.  CSW warned that the song had not been previously reviewed and may contain curse words, if anyone objected.  One person got up and left, and the others stayed and appeared to enjoy the positive message of the song.  Therapeutic Goals: 1)  demonstrate the importance of being a key part of one's own support system 2)  discuss various available supports 3)  encourage patient to use music as part of their self-support and focus on goals 4)  elicit ideas from patients about supports that need to be added   Summary of Patient Progress:  The patient expressed current healthy support includes mother and father because he can talk to them about anything while current unhealthy support is his wife who minimizes and denies.  The patient's self-support was described as sometimes healthy and sometimes  unhealthy, depending on the action he takes at the time.  He praised himself and other group members for coming into the hospital, stating it was a very hard thing to do.  Therapeutic Modalities:   Motivational Interviewing Activity  Maretta Los , LCSW

## 2019-08-04 NOTE — Progress Notes (Signed)
Psychoeducational Group Note  Date:  08/04/2019 Time: 2030  Group Topic/Focus:  wrap up group  Participation Level: Did Not Attend  Participation Quality:  Not Applicable  Affect:  Not Applicable  Cognitive:  Not Applicable  Insight:  Not Applicable  Engagement in Group: Not Applicable  Additional Comments: Pt was notified that group was beginning but was awakened from sleep and fell back to sleep.  Shellia Cleverly 08/04/2019, 9:39 PM

## 2019-08-04 NOTE — Progress Notes (Signed)
Adult Psychoeducational Group Note  Date:  08/04/2019 Time:  12:42 PM  Group Topic/Focus:  Orientation:   The focus of this group is to educate the patient on the purpose and policies of crisis stabilization and provide a format to answer questions about their admission.  The group details unit policies and expectations of patients while admitted.  Participation Level:  Active  Participation Quality:  Appropriate  Affect:  Appropriate  Cognitive:  Alert  Insight: Appropriate  Engagement in Group:  Engaged  Modes of Intervention:  Discussion and Education  Additional Comments:    Pt participated in orientation group. Pt shared in group what brought him to the hospital, triggers, and his coping skills.   Lita Mains 08/04/2019, 12:42 PM

## 2019-08-05 MED ORDER — ACAMPROSATE CALCIUM 333 MG PO TBEC
666.0000 mg | DELAYED_RELEASE_TABLET | Freq: Three times a day (TID) | ORAL | Status: DC
  Administered 2019-08-05 – 2019-08-11 (×18): 666 mg via ORAL
  Filled 2019-08-05 (×26): qty 2

## 2019-08-05 MED ORDER — DULOXETINE HCL 60 MG PO CPEP
60.0000 mg | ORAL_CAPSULE | Freq: Every day | ORAL | Status: DC
  Administered 2019-08-06 – 2019-08-11 (×6): 60 mg via ORAL
  Filled 2019-08-05 (×8): qty 1

## 2019-08-05 MED ORDER — OXCARBAZEPINE 300 MG PO TABS
300.0000 mg | ORAL_TABLET | Freq: Two times a day (BID) | ORAL | Status: DC
  Administered 2019-08-05 – 2019-08-11 (×12): 300 mg via ORAL
  Filled 2019-08-05 (×16): qty 1

## 2019-08-05 MED ORDER — LISINOPRIL 10 MG PO TABS
10.0000 mg | ORAL_TABLET | Freq: Every day | ORAL | Status: DC
  Administered 2019-08-05 – 2019-08-06 (×2): 10 mg via ORAL
  Filled 2019-08-05 (×3): qty 1
  Filled 2019-08-05: qty 2

## 2019-08-05 MED ORDER — OLANZAPINE 7.5 MG PO TABS
7.5000 mg | ORAL_TABLET | Freq: Every day | ORAL | Status: DC
  Administered 2019-08-05 – 2019-08-07 (×3): 7.5 mg via ORAL
  Filled 2019-08-05: qty 1
  Filled 2019-08-05: qty 3
  Filled 2019-08-05 (×3): qty 1
  Filled 2019-08-05: qty 3

## 2019-08-05 NOTE — Tx Team (Signed)
Interdisciplinary Treatment and Diagnostic Plan Update  08/05/2019 Time of Session: 10:15am Donald Carroll MRN: [id number]  Principal Diagnosis: <principal problem not specified>  Secondary Diagnoses: Active Problems:   MDD (major depressive disorder)   Current Medications:  Current Facility-Administered Medications  Medication Dose Route Frequency Provider Last Rate Last Dose  . acamprosate (CAMPRAL) tablet 666 mg  666 mg Oral TID [name], MD      . acetaminophen (TYLENOL) tablet 650 mg  650 mg Oral Q6H PRN [name], NP      . alum & mag hydroxide-simeth (MAALOX/MYLANTA) 200-200-20 MG/5ML suspension 30 mL  30 mL Oral Q4H PRN [name], NP      . Derrill Memo ON 08/06/2019] DULoxetine (CYMBALTA) DR capsule 60 mg  60 mg Oral Daily [name], MD      . gabapentin (NEURONTIN) capsule 300 mg  300 mg Oral TID [name], MD   300 mg at 08/05/19 0734  . hydrOXYzine (ATARAX/VISTARIL) tablet 25 mg  25 mg Oral Q6H PRN [name], NP   25 mg at 08/04/19 0941  . lisinopril (ZESTRIL) tablet 10 mg  10 mg Oral Daily [name], MD   10 mg at 08/05/19 0900  . loperamide (IMODIUM) capsule 2-4 mg  2-4 mg Oral PRN [name], NP      . LORazepam (ATIVAN) tablet 1 mg  1 mg Oral Q6H PRN [name], NP      . magnesium hydroxide (MILK OF MAGNESIA) suspension 30 mL  30 mL Oral Daily PRN [name], NP      . multivitamin with minerals tablet 1 tablet  1 tablet Oral Daily [name], NP   1 tablet at 08/05/19 0734  . nicotine (NICODERM CQ - dosed in mg/24 hours) patch 21 mg  21 mg Transdermal Daily [name], MD   21 mg at 08/05/19 0735  . OLANZapine (ZYPREXA) tablet 7.5 mg  7.5 mg Oral QHS [name], MD      . ondansetron (ZOFRAN-ODT) disintegrating tablet 4 mg  4 mg Oral Q6H PRN [name], NP      . Oxcarbazepine (TRILEPTAL) tablet 300 mg  300 mg Oral BID [name], MD      . thiamine (B-1) injection 100  mg  100 mg Intramuscular Once [name], NP      . thiamine (VITAMIN B-1) tablet 100 mg  100 mg Oral Daily [name], NP   100 mg at 08/05/19 0736  . traZODone (DESYREL) tablet 50 mg  50 mg Oral QHS PRN [name], NP       PTA Medications: Medications Prior to Admission  Medication Sig Dispense Refill Last Dose  . acamprosate (CAMPRAL) 333 MG tablet Take 2 tablets (666 mg total) by mouth 3 (three) times daily with meals. For alcohol cravings 180 tablet 0   . amLODipine (NORVASC) 5 MG tablet Take 1 tablet (5 mg total) by mouth daily. For high blood pressure 30 tablet 0   . DULoxetine (CYMBALTA) 60 MG capsule Take 1 capsule (60 mg total) by mouth daily. For mood control 30 capsule 0   . gabapentin (NEURONTIN) 400 MG capsule Take 1 capsule (400 mg total) by mouth 4 (four) times daily. For mood control and withdrawal symptoms 120 capsule 0   . hydrOXYzine (ATARAX/VISTARIL) 50 MG tablet Take 1 tablet (50 mg total) by mouth every 6 (six) hours as needed  for anxiety. 30 tablet 0   . lisinopril (PRINIVIL,ZESTRIL) 40 MG tablet Take 1 tablet (40 mg total) by mouth daily. For high blood pressure 30 tablet 0   . OLANZapine (ZYPREXA) 20 MG tablet Take 1 tablet (20 mg total) by mouth at bedtime. For mood control 30 tablet 0   . Oxcarbazepine (TRILEPTAL) 300 MG tablet Take 1 tablet (300 mg total) by mouth 2 (two) times daily. For mood control 60 tablet 0   . traZODone (DESYREL) 100 MG tablet Take 2 tablets (200 mg total) by mouth at bedtime. For mood control 60 tablet 0     Patient Stressors: Medication change or noncompliance Substance abuse  Patient Strengths: Motivation for treatment/growth Supportive family/friends  Treatment Modalities: Medication Management, Group therapy, Case management,  1 to 1 session with clinician, Psychoeducation, Recreational therapy.   Physician Treatment Plan for Primary Diagnosis: <principal problem not specified> Long Term Goal(s): Improvement in  symptoms so as ready for discharge Improvement in symptoms so as ready for discharge   Short Term Goals: Ability to identify changes in lifestyle to reduce recurrence of condition will improve Ability to verbalize feelings will improve Ability to disclose and discuss suicidal ideas Ability to demonstrate self-control will improve Ability to identify and develop effective coping behaviors will improve Ability to maintain clinical measurements within normal limits will improve Compliance with prescribed medications will improve Ability to identify triggers associated with substance abuse/mental health issues will improve Ability to identify changes in lifestyle to reduce recurrence of condition will improve Ability to verbalize feelings will improve Ability to disclose and discuss suicidal ideas Ability to demonstrate self-control will improve Ability to identify and develop effective coping behaviors will improve Ability to maintain clinical measurements within normal limits will improve Compliance with prescribed medications will improve Ability to identify triggers associated with substance abuse/mental health issues will improve  Medication Management: Evaluate patient's response, side effects, and tolerance of medication regimen.  Therapeutic Interventions: 1 to 1 sessions, Unit Group sessions and Medication administration.  Evaluation of Outcomes: Progressing  Physician Treatment Plan for Secondary Diagnosis: Active Problems:   MDD (major depressive disorder)  Long Term Goal(s): Improvement in symptoms so as ready for discharge Improvement in symptoms so as ready for discharge   Short Term Goals: Ability to identify changes in lifestyle to reduce recurrence of condition will improve Ability to verbalize feelings will improve Ability to disclose and discuss suicidal ideas Ability to demonstrate self-control will improve Ability to identify and develop effective coping behaviors  will improve Ability to maintain clinical measurements within normal limits will improve Compliance with prescribed medications will improve Ability to identify triggers associated with substance abuse/mental health issues will improve Ability to identify changes in lifestyle to reduce recurrence of condition will improve Ability to verbalize feelings will improve Ability to disclose and discuss suicidal ideas Ability to demonstrate self-control will improve Ability to identify and develop effective coping behaviors will improve Ability to maintain clinical measurements within normal limits will improve Compliance with prescribed medications will improve Ability to identify triggers associated with substance abuse/mental health issues will improve     Medication Management: Evaluate patient's response, side effects, and tolerance of medication regimen.  Therapeutic Interventions: 1 to 1 sessions, Unit Group sessions and Medication administration.  Evaluation of Outcomes: Progressing   RN Treatment Plan for Primary Diagnosis: <principal problem not specified> Long Term Goal(s): Knowledge of disease and therapeutic regimen to maintain health will improve  Short Term Goals: Ability to demonstrate self-control, Ability  to participate in decision making will improve, Ability to verbalize feelings will improve, Ability to disclose and discuss suicidal ideas, Ability to identify and develop effective coping behaviors will improve and Compliance with prescribed medications will improve  Medication Management: RN will administer medications as ordered by provider, will assess and evaluate patient's response and provide education to patient for prescribed medication. RN will report any adverse and/or side effects to prescribing provider.  Therapeutic Interventions: 1 on 1 counseling sessions, Psychoeducation, Medication administration, Evaluate responses to treatment, Monitor vital signs and CBGs as  ordered, Perform/monitor CIWA, COWS, AIMS and Fall Risk screenings as ordered, Perform wound care treatments as ordered.  Evaluation of Outcomes: Progressing   LCSW Treatment Plan for Primary Diagnosis: <principal problem not specified> Long Term Goal(s): Safe transition to appropriate next level of care at discharge, Engage patient in therapeutic group addressing interpersonal concerns.  Short Term Goals: Engage patient in aftercare planning with referrals and resources  Therapeutic Interventions: Assess for all discharge needs, 1 to 1 time with Social worker, Explore available resources and support systems, Assess for adequacy in community support network, Educate family and significant other(s) on suicide prevention, Complete Psychosocial Assessment, Interpersonal group therapy.  Evaluation of Outcomes: Progressing   Progress in Treatment: Attending groups: Yes. Participating in groups: Yes. Taking medication as prescribed: Yes. Toleration medication: Yes. Family/Significant other contact made: No, will contact:  the patient's wife Patient understands diagnosis: Yes. Discussing patient identified problems/goals with staff: Yes. Medical problems stabilized or resolved: Yes. Denies suicidal/homicidal ideation: Yes. Issues/concerns per patient self-inventory: No. Other:   New problem(s) identified: None   New Short Term/Long Term Goal(s): Detox, medication stabilization, elimination of SI thoughts, development of comprehensive mental wellness plan.    Patient Goals:  "To get stabilized and to not be suicidal"   Discharge Plan or Barriers: Patient recently admitted. Patient expressed interest in residential treatment services at discharge.  CSW will continue to follow and assess for appropriate referrals and possible discharge planning.    Reason for Continuation of Hospitalization: Aggression Anxiety Depression Medication stabilization Suicidal ideation Withdrawal  symptoms  Estimated Length of Stay: 3-5 days   Attendees: Patient: Donald Carroll  08/05/2019 11:25 AM  Physician: Dr. Landry Mellow, MD 08/05/2019 11:25 AM  Nursing: Meriam Sprague.Kirtland Bouchard, RN 08/05/2019 11:25 AM  RN Care Manager: 08/05/2019 11:25 AM  Social Worker: Baldo Daub, LCSW 08/05/2019 11:25 AM  Recreational Therapist: Caroll Rancher 08/05/2019 11:25 AM  Other: Marciano Sequin, NP  08/05/2019 11:25 AM  Other:  08/05/2019 11:25 AM  Other: 08/05/2019 11:25 AM    Scribe for Treatment Team: Maeola Sarah, LCSWA 08/05/2019 11:25 AM

## 2019-08-05 NOTE — Progress Notes (Signed)
Recreation Therapy Notes  Date:  10.12.20 Time: 0930 Location: [address] Dayroom  Group Topic: Stress Management  Goal Area(s) Addresses:  Patient will identify positive stress management techniques. Patient will identify benefits of using stress management post d/c.  Behavioral Response: Engaged  Intervention: Stress Management  Activity :  Guided Imagery.  LRT introduced the stress management technique of guided imagery.  LRT read [place] script that guided patients on [place] journey through [place] [facility].  Patients were to follow along as script was read to fully engage in activity.  Education:  Stress Management, Discharge Planning.   Education Outcome: Acknowledges Education  Clinical Observations/Feedback:  Pt attended and participated in activity.     Donald Carroll, LRT/CTRS         Donald Carroll 08/05/2019 10:51 AM 

## 2019-08-05 NOTE — B[facility] Group Notes (Signed)
Adult Psychoeducational Group Note  Date:  08/05/2019 Time:  9:09 PM  Group Topic/Focus:  Wrap-Up Group:   The focus of this group is to help patients review their daily goal of treatment and discuss progress on daily workbooks.  Participation Level:  Active  Participation Quality:  Appropriate and Supportive  Affect:  Depressed  Cognitive:  Alert and Appropriate  Insight: Lacking  Engagement in Group:  Engaged  Modes of Intervention:  Discussion and Education  Additional Comments:  Pt attended and participated in wrap up group this evening. Pt rated their day a 4/10, due to them "having a hard time". Pt completed their goal, which was to go to groups.   Cristi Loron 08/05/2019, 9:09 PM

## 2019-08-05 NOTE — Care Management (Signed)
CMA sent referral to [facility].   CMA will follow up.   CMA will notify CSW.     Donald Carroll Care Management Assistant  Email:[email or web address] Office: [phone]   

## 2019-08-05 NOTE — Care Management (Signed)
CMA sent referral to [facility]. CMA will follow up.      Demian Maisel Care Management Assistant  Email:[email or web address] Office: [phone]  

## 2019-08-05 NOTE — Plan of Care (Signed)
Nurse discussed anxiety, depression and coping skills with patient.  

## 2019-08-05 NOTE — Progress Notes (Signed)
[facility]  08/05/2019 9:33 AM Donald Carroll  MRN:  [id number] Subjective: Patient is a 53 year old male with a past psychiatric history significant for alcohol dependence, opiate dependence, posttraumatic stress disorder and bipolar disorder who [facility] on 08/03/2019 with suicidal ideation.  Objective: Patient is seen and examined.  Patient is a 53 year old male with the above-stated past psychiatric history who is seen in follow-up.  He is doing fairly well.  He still having withdrawal symptoms and does not feel good.  He stated that he was taking off his medications up to the date of admission.  That included his Cymbalta, acamprosate, gabapentin, hydroxyzine, Zyprexa, trazodone and Trileptal.  I had started these at a lower dose on admission because I was concerned about overmedicating him rapidly.  He continues to try and find an [facility] and to work on residential programs for his substance abuse issues.  He stated he is having mild symptoms currently.  His CIWA score was only 1 this morning.  His blood pressures mildly elevated at 135/95.  Pulse was 97, he is afebrile.  Review of his laboratories from 10/10 showed no new laboratories.  His hemoglobin A1c was obtained, and it was only 5.1 despite his high blood sugar.  His TSH was normal as well.  Nursing notes reflect that he slept 5.5 hours last night.  Principal Problem: <principal problem not specified> Diagnosis: Active Problems:   MDD (major depressive disorder)  Total Time spent with patient: 20 minutes  Past Psychiatric History: See admission H&P  Past Medical History:  Past Medical History:  Diagnosis Date  . Anxiety   . Depression   . Hypertension    History reviewed. No pertinent surgical history. Family History: History reviewed. No pertinent family history. Family Psychiatric  History: See admission H&P Social History:  Social History   Substance and  Sexual Activity  Alcohol Use Yes   Comment: drinking daily past month     Social History   Substance and Sexual Activity  Drug Use Yes  . Types: Heroin    Social History   Socioeconomic History  . Marital status: Single    Spouse name: Not on file  . Number of children: Not on file  . Years of education: Not on file  . Highest education level: Not on file  Occupational History  . Not on file  Social Needs  . Financial resource strain: Not on file  . Food insecurity    Worry: Not on file    Inability: Not on file  . Transportation needs    Medical: Not on file    Non-medical: Not on file  Tobacco Use  . Smoking status: Current Every Day Smoker    Packs/day: 1.00    Types: Cigarettes  . Smokeless tobacco: Never Used  Substance and Sexual Activity  . Alcohol use: Yes    Comment: drinking daily past month  . Drug use: Yes    Types: Heroin  . Sexual activity: Not Currently  Lifestyle  . Physical activity    Days per week: Not on file    Minutes per session: Not on file  . Stress: Not on file  Relationships  . Social Musicianconnections    Talks on phone: Not on file    Gets together: Not on file    Attends religious service: Not on file    Active member of club or organization: Not on file    Attends  meetings of clubs or organizations: Not on file    Relationship status: Not on file  Other Topics Concern  . Not on file  Social History Narrative  . Not on file   Additional Social History:                         Sleep: Fair  Appetite:  Fair  Current Medications: Current Facility-Administered Medications  Medication Dose Route Frequency Provider Last Rate Last Dose  . acetaminophen (TYLENOL) tablet 650 mg  650 mg Oral Q6H PRN Oneta Rack, NP      . alum & mag hydroxide-simeth (MAALOX/MYLANTA) 200-200-20 MG/5ML suspension 30 mL  30 mL Oral Q4H PRN Oneta Rack, NP      . Melene Muller ON 08/06/2019] DULoxetine (CYMBALTA) DR capsule 60 mg  60 mg Oral  Daily Antonieta Pert, MD      . gabapentin (NEURONTIN) capsule 300 mg  300 mg Oral TID Antonieta Pert, MD   300 mg at 08/05/19 0734  . hydrOXYzine (ATARAX/VISTARIL) tablet 25 mg  25 mg Oral Q6H PRN Oneta Rack, NP   25 mg at 08/04/19 0941  . lisinopril (ZESTRIL) tablet 10 mg  10 mg Oral Daily Antonieta Pert, MD   10 mg at 08/05/19 0900  . loperamide (IMODIUM) capsule 2-4 mg  2-4 mg Oral PRN Oneta Rack, NP      . LORazepam (ATIVAN) tablet 1 mg  1 mg Oral Q6H PRN Oneta Rack, NP      . magnesium hydroxide (MILK OF MAGNESIA) suspension 30 mL  30 mL Oral Daily PRN Oneta Rack, NP      . multivitamin with minerals tablet 1 tablet  1 tablet Oral Daily Oneta Rack, NP   1 tablet at 08/05/19 0734  . nicotine (NICODERM CQ - dosed in mg/24 hours) patch 21 mg  21 mg Transdermal Daily Antonieta Pert, MD   21 mg at 08/05/19 0735  . OLANZapine (ZYPREXA) tablet 7.5 mg  7.5 mg Oral QHS Antonieta Pert, MD      . ondansetron (ZOFRAN-ODT) disintegrating tablet 4 mg  4 mg Oral Q6H PRN Oneta Rack, NP      . Oxcarbazepine (TRILEPTAL) tablet 300 mg  300 mg Oral BID Antonieta Pert, MD      . thiamine (B-1) injection 100 mg  100 mg Intramuscular Once Oneta Rack, NP      . thiamine (VITAMIN B-1) tablet 100 mg  100 mg Oral Daily Oneta Rack, NP   100 mg at 08/05/19 0736  . traZODone (DESYREL) tablet 50 mg  50 mg Oral QHS PRN Oneta Rack, NP        Lab Results:  Results for orders placed or performed during the hospital encounter of 08/03/19 (from the past 48 hour(s))  TSH     Status: None   Collection Time: 08/04/19  6:25 AM  Result Value Ref Range   TSH 3.218 0.350 - 4.500 uIU/mL    Comment: Performed by a 3rd Generation assay with a functional sensitivity of <=0.01 uIU/mL. Performed at [facility], [address], [place], [place] [address]   Hemoglobin A1c     Status: None   Collection Time: 08/04/19  6:05 PM  Result Value Ref  Range   Hgb A1c MFr Bld 5.1 4.8 - 5.6 %    Comment: (NOTE) Pre diabetes:  5.7%-6.4% Diabetes:              >6.4% Glycemic control for   <7.0% adults with diabetes    Mean Plasma Glucose 99.67 mg/dL    Comment: Performed at [facility], [address] [place], [place] [address]    Blood Alcohol level:  Lab Results  Component Value Date   ETH 57 (H) 08/03/2019   ETH 173 (H) 07/16/2018    Metabolic Disorder Labs: Lab Results  Component Value Date   HGBA1C 5.1 08/04/2019   MPG 99.67 08/04/2019   MPG 85.32 07/18/2018   No results found for: PROLACTIN Lab Results  Component Value Date   CHOL 179 07/18/2018   TRIG 359 (H) 07/18/2018   HDL 37 (L) 07/18/2018   CHOLHDL 4.8 07/18/2018   VLDL 72 (H) 07/18/2018   LDLCALC 70 07/18/2018    Physical Findings: AIMS: Facial and Oral Movements Muscles of Facial Expression: None, normal Lips and Perioral Area: None, normal Jaw: None, normal Tongue: None, normal,Extremity Movements Upper (arms, wrists, hands, fingers): None, normal Lower (legs, knees, ankles, toes): None, normal, Trunk Movements Neck, shoulders, hips: None, normal, Overall Severity Severity of abnormal movements (highest score from questions above): None, normal Incapacitation due to abnormal movements: None, normal Patient's awareness of abnormal movements (rate only patient's report): No Awareness, Dental Status Current problems with teeth and/or dentures?: No Does patient usually wear dentures?: No  CIWA:  CIWA-Ar Total: 1 COWS:     Musculoskeletal: Strength & Muscle Tone: within normal limits Gait & Station: normal Patient leans: N/A  Psychiatric Specialty Exam: Physical Exam  Nursing note and vitals reviewed. Constitutional: He is oriented to person, place, and time. He appears well-developed and well-nourished.  HENT:  Head: Normocephalic and atraumatic.  Respiratory: Effort normal.  Neurological: He is alert and oriented to person,  place, and time.    ROS  Blood pressure (!) 135/95, pulse 97, temperature 97.8 F (36.6 C), temperature source Oral, resp. rate 16, height  (1.803 m), weight 90.7 kg, SpO2 100 %.Body mass index is 27.89 kg/m.  General Appearance: Casual  Eye Contact:  Fair  Speech:  Normal Rate  Volume:  Normal  Mood:  Anxious  Affect:  Congruent  Thought Process:  Coherent and Descriptions of Associations: Intact  Orientation:  Full (Time, Place, and Person)  Thought Content:  Logical  Suicidal Thoughts:  Yes.  without intent/plan  Homicidal Thoughts:  No  Memory:  Immediate;   Fair Recent;   Fair Remote;   Fair  Judgement:  Intact  Insight:  Fair  Psychomotor Activity:  Increased  Concentration:  Concentration: Fair and Attention Span: Fair  Recall:  [employer] of Knowledge:  Fair  Language:  Good  Akathisia:  Negative  Handed:  Right  AIMS (if indicated):     Assets:  Desire for Improvement Resilience  ADL's:  Intact  Cognition:  WNL  Sleep:  Number of Hours: 5.5     Treatment Plan Summary: Daily contact with patient to assess and evaluate symptoms and progress in treatment, Medication management and Plan : Patient is seen and examined.  Patient is a 53 year old male with the above-stated past psychiatric history who is seen in follow-up.   Diagnosis: #1 alcohol dependence, #2 alcohol withdrawal, #3 opiate dependence, #4 posttraumatic stress disorder, #5 bipolar disorder, #6 hypertension  Patient is seen in follow-up.  He is doing fairly well at this point.  He still having some withdrawal symptoms although his  CIWA score was only 1 this morning.  I was concerned on admission that he had not been taking his medications regularly, but he stated he was.  That in mind I will increase his Cymbalta to 60 mg p.o. daily, we have already restarted his gabapentin at 300 mg p.o. 3 times daily, I will increase his lisinopril to 10 mg p.o. daily, I will increase his Trileptal to 300 mg p.o.  twice daily, and his Zyprexa 7.5 mg p.o. nightly.  We will continue his lorazepam 1 mg p.o. 4 times daily as needed withdrawal symptoms with a CIWA greater than 10.  Otherwise no other changes in his medications at this point. 1.  Increase Cymbalta to 60 mg p.o. daily for anxiety and depression. 2.  Continue gabapentin 300 mg p.o. 3 times daily for anxiety and mood stability. 3.  Increase lisinopril to 10 mg p.o. daily for hypertension. 4.  Continue lorazepam 1 mg p.o. every 6 hours PRN a CIWA greater than 10. 5.  Increase Zyprexa to 7.5 mg p.o. nightly for mood stability and sleep. 6.  Increase Trileptal to 300 mg p.o. twice daily for mood stability. 7.  Continue trazodone 50 mg p.o. nightly as needed insomnia. 8.  Restart acamprosate 333 mg 2 tablets p.o. 3 times daily for alcohol dependence. 9.  Disposition planning-in progress.  [name], MD 08/05/2019, 9:33 AM

## 2019-08-05 NOTE — [facility] Group Notes (Signed)
LCSW Group Therapy Note 08/05/2019 2:25 PM  Type of Therapy and Topic: Group Therapy: Overcoming Obstacles  Participation Level: Active  Description of Group:  In this group patients will be encouraged to explore what they see as obstacles to their own wellness and recovery. They will be guided to discuss their thoughts, feelings, and behaviors related to these obstacles. The group will process together ways to cope with barriers, with attention given to specific choices patients can make. Each patient will be challenged to identify changes they are motivated to make in order to overcome their obstacles. This group will be process-oriented, with patients participating in exploration of their own experiences as well as giving and receiving support and challenge from other group members.  Therapeutic Goals: 1. Patient will identify personal and current obstacles as they relate to admission. 2. Patient will identify barriers that currently interfere with their wellness or overcoming obstacles.  3. Patient will identify feelings, thought process and behaviors related to these barriers. 4. Patient will identify two changes they are willing to make to overcome these obstacles:   Summary of Patient Progress  Donald Carroll was engaged and participated throughout the group session. Donald Carroll reports that his main obstacle is "the stigma of mental health and my family's ignorance". Donald Carroll reports that he feels his support system does not understand mental health and addiction and it makes hard when attempting to recover.    Therapeutic Modalities:  Cognitive Behavioral Therapy Solution Focused Therapy Motivational Interviewing Relapse Prevention Therapy   Theresa Duty Clinical Social Worker

## 2019-08-05 NOTE — Care Management (Signed)
Patient has been declined for treatment at [facility]. Per June, Doctor states "patient needs to be compliant with medications for at least 10 days before treatment."  CMA will notify CSW, Radonna Ricker.     Latonga Ponder Care Management Assistant  Email:[email or web address] Office: [phone]

## 2019-08-05 NOTE — Progress Notes (Addendum)
Spiritual care group on grief and loss facilitated by chaplain Jerene Pitch  Group Goal:  Support / Education around grief and loss Members engage in facilitated group support and psycho-social education.  Group Description:  Following introductions and group rules, group members engaged in facilitated group dialog and support around topic of loss, with particular support around experiences of loss in their lives. Group Identified types of loss (relationships / self / things) and identified patterns, circumstances, and changes that precipitate losses. Reflected on thoughts / feelings around loss, normalized grief responses, and recognized variety in grief experience. Group engaged with and modified Worden's four tasks of grief  Patient Progress:   Donald Carroll was present throughout group.  Engaged in group topic.  Noted grief around recent relapse - stated that he was hopeful to feel stable, so he can begin to piece together what life will look like after discharge.  He spoke with other group members about feeling of disorientation in grief, fear in moving forward and feeling drawn to what they know is comfortable.  Dago practiced awareness and reframing thoughts that keep him "spinning."   Specifically, he spoke about "losing" the last year of his life, which he had spent in sobriety.  Group reframed this as focusing on positives, and working to not shame self.

## 2019-08-05 NOTE — Progress Notes (Signed)
D:  Patient's self inventory sheet, patient has fair sleep, no sleep medication.  Poor appetite, low energy level, poor concentration.  Rated depression, hopeless 7, anxiety 8.  Withdrawals, diarrhea, agitation, runny nose.  SI, contracts for safety.  Physical problem, pain, headaches.  Worst pain #5 in past 24 hours, knees and back.  Pain medicine helpful.  Goal is not to feel suicidal for more than a quarter of the day.  Plans to talk to family.  Does have discharge plans. A:  Medications administered per MD orders.  Emotional support and encouragement given patient. R:  Denied HI.  SI, contracts for safety.  Denied A/V hallucinations.  Safety maintained with 15 minute checks.

## 2019-08-06 DIAGNOSIS — F329 Major depressive disorder, single episode, unspecified: Secondary | ICD-10-CM

## 2019-08-06 MED ORDER — LISINOPRIL 20 MG PO TABS
20.0000 mg | ORAL_TABLET | Freq: Every day | ORAL | Status: DC
  Administered 2019-08-07 – 2019-08-11 (×5): 20 mg via ORAL
  Filled 2019-08-06 (×7): qty 1

## 2019-08-06 NOTE — Progress Notes (Signed)
Patient ID: Donald Carroll, male   DOB: 03/03/1966, 53 y.o.   MRN: [id number] Pt has been flat, depressed and c/o anxiety. Pt is appropriate and cooperative on approach. Positive for groups with minimal prompting. Pt endorses adequate sleep and appetite. Denies s/s of withdrawal. Positive for intermittent passive s.i and verbally contracts for safety. EKG completed and placed on chart after provider review.  Level 3 obs for safety. Support and encouragement provided. Med ed reinforced. Contract for safety. Cooperative.

## 2019-08-06 NOTE — Care Management (Signed)
CMA contacted Admissions at [facility]. Patient's referral has not been reviewed yet.   CMA will continue to follow up.    Deryl Ports Care Management Assistant  Email:[email or web address] Office: [phone]

## 2019-08-06 NOTE — Progress Notes (Signed)
Recreation Therapy Notes  Animal-Assisted Activity (AAA) Program Checklist/Progress Notes Patient Eligibility Criteria Checklist & Daily Group note for Rec Tx Intervention  Date: 10.13.20 Time: 1430 Location: [address] Group Room  AAA/T Program Assumption of Risk Form signed by Patient/ or Parent Legal Guardian  YES   Patient is free of allergies or sever asthma  YES   Patient reports no fear of animals  YES  Patient reports no history of cruelty to animals  YES   Patient understands his/her participation is voluntary YES   Patient washes hands before animal contact  YES   Patient washes hands after animal contact  YES  Behavioral Response:  Engaged  Education: Hand Washing, Appropriate Animal Interaction   Education Outcome: Acknowledges understanding/In group clarification offered/Needs additional education.   Clinical Observations/Feedback: Pt attended and participated in activity.     Donald Carroll, LRT/CTRS         Donald Carroll A 08/06/2019 3:28 PM 

## 2019-08-06 NOTE — Progress Notes (Addendum)
[facility] MD Progress Note  08/06/2019 1:59 PM Donald Carroll  MRN:  [id number] Subjective: Patient reports partial improvement compared to admission.  Currently denies suicidal ideations.  Currently denies medication side effects.  Objective: I have discussed case with treatment team and met with patient. 53 year old male.  Prior history of alcohol, opiate, PTSD, and bipolar disorder diagnosis.  He presented to ED on 10/10 reporting depression, anxiety, poor sleep, suicidal ideations of overdosing on fentanyl.  He reports he had relapsed on alcohol several after period of several weeks of sobriety ( had not relapsed on Opiates ).  Admission BAL positive at 57, UDS positive for cannabis. Patient has had history of a prior psychiatric admission to [facility] on September 2019, following opiate overdose.  At that time he also reported depression, suicidal ideations.  Was discharged on Campral, Cymbalta, Neurontin, Zyprexa.  Today patient describes partial improvement compared to how he felt on admission.  He does endorse some ongoing depression/anxiety but denies any suicidal ideations and contracts for safety on unit. He is currently future oriented, hoping to go to rehab after discharge. At this time he is participating in groups, presents polite on approach. No disruptive or agitated behaviors on unit. Denies medication side effects thus far. Principal Problem: MDD, Alcohol Use Disorder, Substance Induced Mood Disorder  Diagnosis: Active Problems:   MDD (major depressive disorder)  Total Time spent with patient: 20 minutes  Past Psychiatric History: See admission H&P  Past Medical History:  Past Medical History:  Diagnosis Date  . Anxiety   . Depression   . Hypertension    History reviewed. No pertinent surgical history. Family History: History reviewed. No pertinent family history. Family Psychiatric  History: See admission H&P Social History:  Social History   Substance and Sexual Activity   Alcohol Use Yes   Comment: drinking daily past month     Social History   Substance and Sexual Activity  Drug Use Yes  . Types: Heroin    Social History   Socioeconomic History  . Marital status: Single    Spouse name: Not on file  . Number of children: Not on file  . Years of education: Not on file  . Highest education level: Not on file  Occupational History  . Not on file  Social Needs  . Financial resource strain: Not on file  . Food insecurity    Worry: Not on file    Inability: Not on file  . Transportation needs    Medical: Not on file    Non-medical: Not on file  Tobacco Use  . Smoking status: Current Every Day Smoker    Packs/day: 1.00    Types: Cigarettes  . Smokeless tobacco: Never Used  Substance and Sexual Activity  . Alcohol use: Yes    Comment: drinking daily past month  . Drug use: Yes    Types: Heroin  . Sexual activity: Not Currently  Lifestyle  . Physical activity    Days per week: Not on file    Minutes per session: Not on file  . Stress: Not on file  Relationships  . Social Herbalist on phone: Not on file    Gets together: Not on file    Attends religious service: Not on file    Active member of club or organization: Not on file    Attends meetings of clubs or organizations: Not on file    Relationship status: Not on file  Other Topics Concern  .  Not on file  Social History Narrative  . Not on file   Additional Social History:   Sleep: Improving  Appetite:  Improving  Current Medications: Current Facility-Administered Medications  Medication Dose Route Frequency Provider Last Rate Last Dose  . acamprosate (CAMPRAL) tablet 666 mg  666 mg Oral TID [name], MD   666 mg at 08/06/19 1153  . acetaminophen (TYLENOL) tablet 650 mg  650 mg Oral Q6H PRN [name], NP      . alum & mag hydroxide-simeth (MAALOX/MYLANTA) 200-200-20 MG/5ML suspension 30 mL  30 mL Oral Q4H PRN [name], NP      . DULoxetine  (CYMBALTA) DR capsule 60 mg  60 mg Oral Daily [name], MD   60 mg at 08/06/19 7106  . gabapentin (NEURONTIN) capsule 300 mg  300 mg Oral TID [name], MD   300 mg at 08/06/19 1154  . hydrOXYzine (ATARAX/VISTARIL) tablet 25 mg  25 mg Oral Q6H PRN [name], NP   25 mg at 08/04/19 0941  . [START ON 08/07/2019] lisinopril (ZESTRIL) tablet 20 mg  20 mg Oral Daily Adamariz Gillott A, MD      . loperamide (IMODIUM) capsule 2-4 mg  2-4 mg Oral PRN [name], NP      . LORazepam (ATIVAN) tablet 1 mg  1 mg Oral Q6H PRN [name], NP      . magnesium hydroxide (MILK OF MAGNESIA) suspension 30 mL  30 mL Oral Daily PRN [name], NP      . multivitamin with minerals tablet 1 tablet  1 tablet Oral Daily [name], NP   1 tablet at 08/06/19 [phone]  . nicotine (NICODERM CQ - dosed in mg/24 hours) patch 21 mg  21 mg Transdermal Daily [name], MD   21 mg at 08/06/19 0859  . OLANZapine (ZYPREXA) tablet 7.5 mg  7.5 mg Oral QHS [name], MD   7.5 mg at 08/05/19 2102  . ondansetron (ZOFRAN-ODT) disintegrating tablet 4 mg  4 mg Oral Q6H PRN [name], NP      . Oxcarbazepine (TRILEPTAL) tablet 300 mg  300 mg Oral BID [name], MD   300 mg at 08/06/19 0834  . thiamine (B-1) injection 100 mg  100 mg Intramuscular Once [name], NP      . thiamine (VITAMIN B-1) tablet 100 mg  100 mg Oral Daily [name], NP   100 mg at 08/06/19 0834  . traZODone (DESYREL) tablet 50 mg  50 mg Oral QHS PRN [name], NP   50 mg at 08/05/19 2103    Lab Results:  Results for orders placed or performed during the hospital encounter of 08/03/19 (from the past 48 hour(s))  Hemoglobin A1c     Status: None   Collection Time: 08/04/19  6:05 PM  Result Value Ref Range   Hgb A1c MFr Bld 5.1 4.8 - 5.6 %    Comment: (NOTE) Pre diabetes:          5.7%-6.4% Diabetes:              >6.4% Glycemic control for   <7.0% adults with diabetes    Mean  Plasma Glucose 99.67 mg/dL    Comment: Performed at [facility] [address], [place], [place] [address]    Blood Alcohol level:  Lab Results  Component Value Date   ETH 21 (  H) 08/03/2019   ETH 173 (H) 78/46/9629    Metabolic Disorder Labs: Lab Results  Component Value Date   HGBA1C 5.1 08/04/2019   MPG 99.67 08/04/2019   MPG 85.32 07/18/2018   No results found for: PROLACTIN Lab Results  Component Value Date   CHOL 179 07/18/2018   TRIG 359 (H) 07/18/2018   HDL 37 (L) 07/18/2018   CHOLHDL 4.8 07/18/2018   VLDL 72 (H) 07/18/2018   LDLCALC 70 07/18/2018    Physical Findings: AIMS: Facial and Oral Movements Muscles of Facial Expression: None, normal Lips and Perioral Area: None, normal Jaw: None, normal Tongue: None, normal,Extremity Movements Upper (arms, wrists, hands, fingers): None, normal Lower (legs, knees, ankles, toes): None, normal, Trunk Movements Neck, shoulders, hips: None, normal, Overall Severity Severity of abnormal movements (highest score from questions above): None, normal Incapacitation due to abnormal movements: None, normal Patient's awareness of abnormal movements (rate only patient's report): No Awareness, Dental Status Current problems with teeth and/or dentures?: No Does patient usually wear dentures?: No  CIWA:  CIWA-Ar Total: 1 COWS:  COWS Total Score: 2  Musculoskeletal: Strength & Muscle Tone: within normal limits Gait & Station: normal Patient leans: N/A  Psychiatric Specialty Exam: Physical Exam  Nursing note and vitals reviewed. Constitutional: He is oriented to person, place, and time. He appears well-developed and well-nourished.  HENT:  Head: Normocephalic and atraumatic.  Respiratory: Effort normal.  Neurological: He is alert and oriented to person, place, and time.    ROS currently denies chest pain or shortness of breath, no vomiting  Blood pressure (!) 122/107, pulse 97, temperature 97.7 F (36.5 C), resp.  rate 16, height 5' 11"  (1.803 m), weight 90.7 kg, SpO2 100 %.Body mass index is 27.89 kg/m.  General Appearance: Casual  Eye Contact:  Fair/improved partially during session  Speech:  Normal Rate  Volume:  Normal  Mood:  Reports some improvement compared to admission but remains depressed  Affect:  Appropriate, vaguely constricted  Thought Process:  Linear and Descriptions of Associations: Intact  Orientation:  Other:  Fully alert and attentive  Thought Content:  No hallucination, no delusions  Suicidal Thoughts:  No currently denies suicidal or self-injurious ideations, also denies homicidal or violent ideations, contracts for safety on unit.  Homicidal Thoughts:  No  Memory:  Recent and remote grossly intact  Judgement:  Fair/improving  Insight:  Fair/improving  Psychomotor Activity:  Normal-no significant psychomotor agitation or restlessness noted at this time, does not appear uncomfortable or in any distress at the present time.  Concentration:  Concentration: Good and Attention Span: Good  Recall:  Good  Fund of Knowledge:  Good  Language:  Good  Akathisia:  Negative  Handed:  Right  AIMS (if indicated):     Assets:  Desire for Improvement Resilience  ADL's:  Intact  Cognition:  WNL  Sleep:  Number of Hours: 6.75   Assessment -  53 year old male.  Prior history of alcohol, opiate, PTSD, and bipolar disorder diagnosis.  He presented to ED on 10/10 reporting depression, anxiety, poor sleep, suicidal ideations of overdosing on fentanyl.  He reports he had relapsed on alcohol several after period of several weeks of sobriety ( had not relapsed on Opiates ).  Admission BAL positive at 57, UDS positive for cannabis. Patient has had history of a prior psychiatric admission to [facility] on September 2019, following opiate overdose.  At that time he also reported depression, suicidal ideations.  Was discharged on Campral, Cymbalta, Neurontin, Zyprexa.  Today patient reports some improvement  compared to admission although remains vaguely depressed and constricted/anxious in affect.  Denies SI and presents future oriented, hoping to go to rehab at discharge.  Currently he is not presenting with significant withdrawal symptoms and appears calm and in no acute distress. No tremors or diaphoresis are noted.  He does report a history of hypertension for which she was taking lisinopril before admission Denies medication side effects at this time.  Of note, reports prior history of bipolar disorder diagnoses and endorses history of mood instability, brief  mood swings,  but currently does not endorse any clear history of prior manic episodes.  Treatment Plan Summary: Treatment plan reviewed as below today 10/13 Encourage group and milieu participation Encourage efforts to work on sobriety and relapse prevention Treatment team working on disposition options, as noted patient is interested in going to rehab after discharge. Continue Cymbalta to 60 mg  daily for anxiety and depression. Continue Gabapentin 300 mg p.o. 3 times daily for anxiety and mood disorder Increase Lisinopril to 20 mg p.o. daily for hypertension. Continue Lorazepam 1 mg p.o. every 6 hours PRN a CIWA greater than 10. Continue Thiamine and MVI supplementation Continue Zyprexa 7.5 mg p.o. nightly for mood disorder and insomnia Continue Trileptal 300 mg p.o. twice daily for mood disorder D/C Trazodone  Continue Acamprosate 333 mg 2 tablets p.o. 3 times daily for alcohol dependence. Check BMP to monitor K+ serum level   Jenne Campus, MD 08/06/2019, 1:59 PM   Patient ID: Donald Carroll, male   DOB: 1966/03/21, 53 y.o.   MRN: [id number]

## 2019-08-06 NOTE — Progress Notes (Signed)
Patient ID: Donald Carroll, male   DOB: 06/18/1966, 53 y.o.   MRN: [id number]   [place] NOVEL CORONAVIRUS (COVID-19) DAILY CHECK-OFF SYMPTOMS - answer yes or no to each - every day NO YES  Have you had a fever in the past 24 hours?  . Fever (Temp > 37.80C / 100F) X   Have you had any of these symptoms in the past 24 hours? . New Cough .  Sore Throat  .  Shortness of Breath .  Difficulty Breathing .  Unexplained Body Aches   X   Have you had any one of these symptoms in the past 24 hours not related to allergies?   . Runny Nose .  Nasal Congestion .  Sneezing   X   If you have had runny nose, nasal congestion, sneezing in the past 24 hours, has it worsened?  X   EXPOSURES - check yes or no X   Have you traveled outside the state in the past 14 days?  X   Have you been in contact with someone with a confirmed diagnosis of COVID-19 or PUI in the past 14 days without wearing appropriate PPE?  X   Have you been living in the same home as a person with confirmed diagnosis of COVID-19 or a PUI (household contact)?    X   Have you been diagnosed with COVID-19?    X              What to do next: Answered NO to all: Answered YES to anything:   Proceed with unit schedule Follow the [facility] Inpatient Flowsheet.   

## 2019-08-06 NOTE — Progress Notes (Signed)
Patient ID: Donald Carroll, male   DOB: Mar 10, 1966, 53 y.o.   MRN: [id number]   D: Patient has a flat affect on approach tonight. Reports depressed mood continues but no active SI. Doesn't feel that he has improved much since admission. CIWA=2 tonight.  A: Staff will monitor on q 15 minute checks, follow treatment plan, and give medication as scheduled. R: Patient cooperative on the unit.

## 2019-08-06 NOTE — [facility] Group Notes (Signed)
Adult Psychoeducational Group Note  Date:  08/06/2019 Time:  8:32 PM  Group Topic/Focus:  Wrap-Up Group:   The focus of this group is to help patients review their daily goal of treatment and discuss progress on daily workbooks.  Participation Level:  Active  Participation Quality:  Appropriate  Affect:  Appropriate  Cognitive:  Appropriate  Insight: Appropriate  Engagement in Group:  Engaged  Modes of Intervention:  Discussion  Additional Comments:  Patient attended and participated in the Wrap up group.  Annie Sable 08/06/2019, 8:32 PM

## 2019-08-07 LAB — LIPID PANEL
Cholesterol: 263 mg/dL — ABNORMAL HIGH (ref 0–200)
HDL: 37 mg/dL — ABNORMAL LOW (ref >40)
LDL Cholesterol: 178 mg/dL — ABNORMAL HIGH (ref 0–99)
Total CHOL/HDL Ratio: 7.1 RATIO
Triglycerides: 242 mg/dL — ABNORMAL HIGH (ref <150)
VLDL: 48 mg/dL — ABNORMAL HIGH (ref 0–40)

## 2019-08-07 LAB — BASIC METABOLIC PANEL
Anion gap: 12 (ref 5–15)
BUN: 16 mg/dL (ref 6–20)
CO2: 25 mmol/L (ref 22–32)
Calcium: 9.3 mg/dL (ref 8.9–10.3)
Chloride: 100 mmol/L (ref 98–111)
Creatinine, Ser: 0.97 mg/dL (ref 0.61–1.24)
GFR calc Af Amer: >60 mL/min (ref >60)
GFR calc non Af Amer: >60 mL/min (ref >60)
Glucose, Bld: 111 mg/dL — ABNORMAL HIGH (ref 70–99)
Potassium: 4.1 mmol/L (ref 3.5–5.1)
Sodium: 137 mmol/L (ref 135–145)

## 2019-08-07 MED ORDER — AMLODIPINE BESYLATE 5 MG PO TABS
5.0000 mg | ORAL_TABLET | Freq: Every day | ORAL | Status: DC
  Administered 2019-08-07 – 2019-08-11 (×5): 5 mg via ORAL
  Filled 2019-08-07 (×8): qty 1

## 2019-08-07 MED ORDER — GABAPENTIN 400 MG PO CAPS
400.0000 mg | ORAL_CAPSULE | Freq: Three times a day (TID) | ORAL | Status: DC
  Administered 2019-08-07 – 2019-08-11 (×12): 400 mg via ORAL
  Filled 2019-08-07 (×18): qty 1

## 2019-08-07 NOTE — Progress Notes (Signed)
D:  Patient's self inventory sheet, patient has poor sleep, no sleep medication.  Poor appetite, low energy level, poor concentration.  Rated depression 8, hopeless and anxiety 7.  Denied withdrawals, then checked diarrhea, agitation.  SI, contracts for safety.  Physical problems, headaches, knees and back, worst pain #4 in past 24 hours.  Pain medicine helpful.  Goal is not to isolate, participate in groups.  Plans to stay up and attentive.  No discharge plans. A:  Medications administered per MD orders.  Emotional support and encouragement given patient. R:  Denied SI and Hi, contracts for safety.  Denied A/V hallucinations.  Safety maintained with 15 minute checks.

## 2019-08-07 NOTE — Progress Notes (Signed)
Recreation Therapy Notes  Date:  10.14.20 Time: 0930 Location: [address] Group Room  Group Topic: Stress Management  Goal Area(s) Addresses:  Patient will identify positive stress management techniques. Patient will identify benefits of using stress management post d/c.  Intervention: Stress Management  Activity :  Meditation.  LRT played a meditation that focused on being resilient in the face of adversity.  Patients were to listen and follow along as meditation played.  Education:  Stress Management, Discharge Planning.   Education Outcome: Acknowledges Education  Clinical Observations/Feedback:  Pt did not attend group.    Lakendra Helling, LRT/CTRS         Velda Wendt A 08/07/2019 10:44 AM 

## 2019-08-07 NOTE — Progress Notes (Signed)
[facility] Group Notes:  (Nursing/MHT/Case Management/Adjunct)  Date:  08/07/2019  Time:  2030  Type of Therapy:  wrap up group  Participation Level:  Active  Participation Quality:  Appropriate, Attentive, Sharing and Supportive  Affect:  Blunted  Cognitive:  Appropriate  Insight:  Improving  Engagement in Group:  Engaged  Modes of Intervention:  Clarification, Education and Support  Summary of Progress/Problems:  Donald Carroll 08/07/2019, 9:31 PM

## 2019-08-07 NOTE — Progress Notes (Signed)
[facility] MD Progress Note  08/07/2019 12:11 PM TAO SATZ  MRN:  [id number] Subjective: Patient reports persistent depression and anxiety.  He expresses significant worry about relapse risk.  He states he is "really wanting" to go to a rehab at discharge as he feels there is a significant risk of relapsing should he return to the community at this time. He denies suicidal ideations, contracts for safety. He denies medication side effects thus far.  Objective: I have discussed case with treatment team and met with patient. 53 year old male.  Prior history of alcohol, opiate, PTSD, and bipolar disorder diagnosis.  He presented to ED on 10/10 reporting depression, anxiety, poor sleep, suicidal ideations of overdosing on fentanyl.  He reports he had relapsed on alcohol several after period of several weeks of sobriety ( had not relapsed on Opiates ).  Admission BAL positive at 57, UDS positive for cannabis. Patient has had history of a prior psychiatric admission to [facility] on September 2019, following opiate overdose.  At that time he also reported depression, suicidal ideations.  Was discharged on Campral, Cymbalta, Neurontin, Zyprexa.  Patient reports some persistent depression/sense of sadness as well as anxiety.  Endorses some intermittent passive SI but denies any suicidal plan or intention, contracts for safety on unit and presents future oriented.  Tends to ruminate about potential risk of relapsing should he return to the community and states "I do not feel strong enough right now".  He is, as above, wanting to go to rehab at discharge. He is tolerating medications well and does state that Campral has been helpful in decreasing cravings.  In fact, he states that he had stopped Campral prior to his relapse and thinks that "that might have been part of it".  He is also on Zyprexa, Trileptal, Neurontin, Cymbalta.  None of these medications are new and he states he has a history of tolerating them well  and responding to them. No disruptive or agitated behaviors on unit.  Polite on approach. Labs reviewed-BMP unremarkable, lipid profile remarkable for hypercholesterolemia at 263 and hypertriglyceridemia at 242. Principal Problem: MDD, Alcohol Use Disorder, Substance Induced Mood Disorder  Diagnosis: Active Problems:   MDD (major depressive disorder)  Total Time spent with patient: 20 minutes  Past Psychiatric History: See admission H&P  Past Medical History:  Past Medical History:  Diagnosis Date  . Anxiety   . Depression   . Hypertension    History reviewed. No pertinent surgical history. Family History: History reviewed. No pertinent family history. Family Psychiatric  History: See admission H&P Social History:  Social History   Substance and Sexual Activity  Alcohol Use Yes   Comment: drinking daily past month     Social History   Substance and Sexual Activity  Drug Use Yes  . Types: Heroin    Social History   Socioeconomic History  . Marital status: Single    Spouse name: Not on file  . Number of children: Not on file  . Years of education: Not on file  . Highest education level: Not on file  Occupational History  . Not on file  Social Needs  . Financial resource strain: Not on file  . Food insecurity    Worry: Not on file    Inability: Not on file  . Transportation needs    Medical: Not on file    Non-medical: Not on file  Tobacco Use  . Smoking status: Current Every Day Smoker    Packs/day: 1.00  Types: Cigarettes  . Smokeless tobacco: Never Used  Substance and Sexual Activity  . Alcohol use: Yes    Comment: drinking daily past month  . Drug use: Yes    Types: Heroin  . Sexual activity: Not Currently  Lifestyle  . Physical activity    Days per week: Not on file    Minutes per session: Not on file  . Stress: Not on file  Relationships  . Social Herbalist on phone: Not on file    Gets together: Not on file    Attends religious  service: Not on file    Active member of club or organization: Not on file    Attends meetings of clubs or organizations: Not on file    Relationship status: Not on file  Other Topics Concern  . Not on file  Social History Narrative  . Not on file   Additional Social History:   Sleep: Improving  Appetite:  Improving  Current Medications: Current Facility-Administered Medications  Medication Dose Route Frequency Provider Last Rate Last Dose  . acamprosate (CAMPRAL) tablet 666 mg  666 mg Oral TID [name], MD   666 mg at 08/07/19 1159  . acetaminophen (TYLENOL) tablet 650 mg  650 mg Oral Q6H PRN [name], NP      . alum & mag hydroxide-simeth (MAALOX/MYLANTA) 200-200-20 MG/5ML suspension 30 mL  30 mL Oral Q4H PRN [name], NP      . amLODipine (NORVASC) tablet 5 mg  5 mg Oral Daily Cobos, Myer Peer, MD   5 mg at 08/07/19 1000  . DULoxetine (CYMBALTA) DR capsule 60 mg  60 mg Oral Daily [name], MD   60 mg at 08/07/19 0752  . gabapentin (NEURONTIN) capsule 400 mg  400 mg Oral TID Cobos, Myer Peer, MD   400 mg at 08/07/19 1200  . lisinopril (ZESTRIL) tablet 20 mg  20 mg Oral Daily Cobos, Myer Peer, MD   20 mg at 08/07/19 0752  . magnesium hydroxide (MILK OF MAGNESIA) suspension 30 mL  30 mL Oral Daily PRN [name], NP      . multivitamin with minerals tablet 1 tablet  1 tablet Oral Daily [name], NP   1 tablet at 08/07/19 0800  . nicotine (NICODERM CQ - dosed in mg/24 hours) patch 21 mg  21 mg Transdermal Daily [name], MD   21 mg at 08/07/19 0755  . OLANZapine (ZYPREXA) tablet 7.5 mg  7.5 mg Oral QHS [name], MD   7.5 mg at 08/06/19 2110  . Oxcarbazepine (TRILEPTAL) tablet 300 mg  300 mg Oral BID [name], MD   300 mg at 08/07/19 0753  . thiamine (B-1) injection 100 mg  100 mg Intramuscular Once [name], NP      . thiamine (VITAMIN B-1) tablet 100 mg  100 mg Oral Daily [name], NP   100 mg  at 08/07/19 0753    Lab Results:  Results for orders placed or performed during the hospital encounter of 08/03/19 (from the past 48 hour(s))  Basic metabolic panel     Status: Abnormal   Collection Time: 08/07/19  6:47 AM  Result Value Ref Range   Sodium 137 135 - 145 mmol/L   Potassium 4.1 3.5 - 5.1 mmol/L   Chloride 100 98 - 111 mmol/L   CO2 25 22 - 32 mmol/L   Glucose, Bld 111 (H) 70 -  99 mg/dL   BUN 16 6 - 20 mg/dL   Creatinine, Ser 0.97 0.61 - 1.24 mg/dL   Calcium 9.3 8.9 - 10.3 mg/dL   GFR calc non Af Amer >60 >60 mL/min   GFR calc Af Amer >60 >60 mL/min   Anion gap 12 5 - 15    Comment: Performed at [facility], [place] [address] [place], [place] [address]  Lipid panel     Status: Abnormal   Collection Time: 08/07/19  6:47 AM  Result Value Ref Range   Cholesterol 263 (H) 0 - 200 mg/dL   Triglycerides 242 (H) <150 mg/dL   HDL 37 (L) >40 mg/dL   Total CHOL/HDL Ratio 7.1 RATIO   VLDL 48 (H) 0 - 40 mg/dL   LDL Cholesterol 178 (H) 0 - 99 mg/dL    Comment:        Total Cholesterol/HDL:CHD Risk Coronary Heart Disease Risk Table                     Men   Women  1/2 Average Risk   3.4   3.3  Average Risk       5.0   4.4  2 X Average Risk   9.6   7.1  3 X Average Risk  23.4   11.0        Use the calculated Patient Ratio above and the CHD Risk Table to determine the patient's CHD Risk.        ATP III CLASSIFICATION (LDL):  <100     mg/dL   Optimal  100-129  mg/dL   Near or Above                    Optimal  130-159  mg/dL   Borderline  160-189  mg/dL   High  >190     mg/dL   Very High Performed at [facility] [address] [place], [place] [address]     Blood Alcohol level:  Lab Results  Component Value Date   ETH 57 (H) 08/03/2019   ETH 173 (H) 82/95/6213    Metabolic Disorder Labs: Lab Results  Component Value Date   HGBA1C 5.1 08/04/2019   MPG 99.67 08/04/2019   MPG 85.32 07/18/2018   No results found for:  PROLACTIN Lab Results  Component Value Date   CHOL 263 (H) 08/07/2019   TRIG 242 (H) 08/07/2019   HDL 37 (L) 08/07/2019   CHOLHDL 7.1 08/07/2019   VLDL 48 (H) 08/07/2019   LDLCALC 178 (H) 08/07/2019   LDLCALC 70 07/18/2018    Physical Findings: AIMS: Facial and Oral Movements Muscles of Facial Expression: None, normal Lips and Perioral Area: None, normal Jaw: None, normal Tongue: None, normal,Extremity Movements Upper (arms, wrists, hands, fingers): None, normal Lower (legs, knees, ankles, toes): None, normal, Trunk Movements Neck, shoulders, hips: None, normal, Overall Severity Severity of abnormal movements (highest score from questions above): None, normal Incapacitation due to abnormal movements: None, normal Patient's awareness of abnormal movements (rate only patient's report): No Awareness, Dental Status Current problems with teeth and/or dentures?: No Does patient usually wear dentures?: No  CIWA:  CIWA-Ar Total: 2 COWS:  COWS Total Score: 2  Musculoskeletal: Strength & Muscle Tone: within normal limits Gait & Station: normal Patient leans: N/A  Psychiatric Specialty Exam: Physical Exam  Nursing note and vitals reviewed. Constitutional: He is oriented to person, place, and time. He appears well-developed and well-nourished.  HENT:  Head: Normocephalic and atraumatic.  Respiratory: Effort normal.  Neurological: He is alert and oriented to person, place, and time.    ROS no headache, no chest pain, no dyspnea at room air, no vomiting, no fever, no chills.  Blood pressure (!) 134/117, pulse 86, temperature (!) 97.5 F (36.4 C), temperature source Oral, resp. rate 16, height 5' 11"  (1.803 m), weight 90.7 kg, SpO2 100 %.Body mass index is 27.89 kg/m.  General Appearance: Casual  Eye Contact: Good  Speech:  Normal Rate  Volume:  Normal  Mood:  Reports some persistent depression although does acknowledge improvement compared to admission.  Affect:  Remains vaguely  constricted and anxious  Thought Process:  Linear and Descriptions of Associations: Intact  Orientation:  Other:  Fully alert and attentive  Thought Content:  No hallucination, no delusions  Suicidal Thoughts:  Yes.  without intent/plan endorses some intermittent passive thoughts of death/dying but denies any suicidal plan or intention and presents future oriented.  Contracts for safety on unit.  Homicidal Thoughts:  No  Memory:  Recent and remote grossly intact  Judgement:  Fair/improving  Insight:  Fair/improving  Psychomotor Activity:  Normal-no current restlessness or agitation.  Concentration:  Concentration: Good and Attention Span: Good  Recall:  Good  Fund of Knowledge:  Good  Language:  Good  Akathisia:  Negative  Handed:  Right  AIMS (if indicated):     Assets:  Desire for Improvement Resilience  ADL's:  Intact  Cognition:  WNL  Sleep:  Number of Hours: 6.25   Assessment -  53 year old male.  Prior history of alcohol, opiate, PTSD, and bipolar disorder diagnosis.  He presented to ED on 10/10 reporting depression, anxiety, poor sleep, suicidal ideations of overdosing on fentanyl.  He reports he had relapsed on alcohol several after period of several weeks of sobriety ( had not relapsed on Opiates ).  Admission BAL positive at 57, UDS positive for cannabis. Patient has had history of a prior psychiatric admission to [facility] on September 2019, following opiate overdose.  At that time he also reported depression, suicidal ideations.  Was discharged on Campral, Cymbalta, Neurontin, Zyprexa.  Reports still feeling depressed and anxious.  Tends to focus and ruminate on alcohol relapse and concerned that he may continue to drink if he returns to the community , due to which he is hopeful to go to rehab setting after discharge.  He is tolerating medications well, which include Campral (reports this medication has helped curb/address cravings and helped maintain sobriety in the past).   Endorses some intermittent passive SI but denies any suicidal plan or intention and contracts for safety on unit.  BP has remained elevated, without associated symptoms.  Patient reports he was taking amlodipine in addition to/concomitantly with lisinopril in the past, without side effects and better BP control.  Of note at this time he is not presenting with significant symptoms of withdrawal.  No tremors, no diaphoresis, no agitation, no tachycardia.  Treatment Plan Summary: Treatment plan reviewed as below today 10/14 Encourage group and milieu participation Encourage efforts to work on sobriety and relapse prevention Treatment team working on disposition options, as noted patient is interested in going to rehab after discharge. Continue Cymbalta to 60 mg  daily for anxiety and depression. Increase Gabapentin to 400 mg p.o. 3 times daily for anxiety, alcohol use disorder, mood disorder Continue Lisinopril  20 mg p.o. daily for hypertension. Start Amlodipine 5 mgrs daily  for hypertension Continue Lorazepam 1 mg p.o.  every 6 hours PRN a CIWA greater than 10. Continue Thiamine and MVI supplementation Continue Zyprexa 7.5 mg p.o. nightly for mood disorder and insomnia Continue Trileptal 300 mg p.o. twice daily for mood disorder  Continue Acamprosate 666 tablets p.o. 3 times daily for alcohol dependence.   Jenne Campus, MD 08/07/2019, 12:11 PM   Patient ID: Donald Carroll, male   DOB: 06/24/1966, 53 y.o.   MRN: [id number]

## 2019-08-07 NOTE — Plan of Care (Signed)
Nurse discussed anxiety, depression and coping skills with patient.  

## 2019-08-08 MED ORDER — OLANZAPINE 10 MG PO TABS
10.0000 mg | ORAL_TABLET | Freq: Every day | ORAL | Status: DC
  Administered 2019-08-08 – 2019-08-10 (×3): 10 mg via ORAL
  Filled 2019-08-08 (×5): qty 1

## 2019-08-08 NOTE — [facility] Suicide Risk Assessment (Signed)
[facility] INPATIENT:  Family/Significant Other Suicide Prevention Education  Suicide Prevention Education:  Education Completed; with wife, Sharlene Motts [phone]) has been identified by the patient as the family member/significant other with whom the patient will be residing, and identified as the person(s) who will aid the patient in the event of a mental health crisis (suicidal ideations/suicide attempt).  With written consent from the patient, the family member/significant other has been provided the following suicide prevention education, prior to the and/or following the discharge of the patient.  The suicide prevention education provided includes the following:  Suicide risk factors  Suicide prevention and interventions  National Suicide Hotline telephone number  [facility] assessment telephone number  [facility] Emergency Assistance [place] and/or Residential Mobile Crisis Unit telephone number  Request made of family/significant other to:  Remove weapons (e.g., guns, rifles, knives), all items previously/currently identified as safety concern.    Remove drugs/medications (over-the-counter, prescriptions, illicit drugs), all items previously/currently identified as a safety concern.  The family member/significant other verbalizes understanding of the suicide prevention education information provided.  The family member/significant other agrees to remove the items of safety concern listed above.  Pamala Hurry reports that she does not have any current questions or concerns at this time.   She reports that she and the patient agree that long-term or short term residential treatment services at discharge will be beneficial to the patient's long-term recovery. She shared that she does not believe the patient "can live" without receiving treatment for his co-occurring diagnosis.  She reports that she will continue to be supportive of the patient. CSW will  continue to follow.   Marylee Floras 08/08/2019, 2:56 PM

## 2019-08-08 NOTE — Progress Notes (Signed)
Patient expressed interest in residential treatment placement to CSW. Patient was referred to the following facilities for potential placement:  [facility], per admissions, the reviewing MD recommends the patient be compliant with medications (10) days before being reviewed again. Initial referral was made 08/03/19 by Chyree Counts, CMA.   [facility] - No bed availability; Admissions reports there may be beds available sometime next week, however they are definite due to availability being based on discharges/completion of programming.   [facility] - No bed availability; Admissions reports there may be beds available sometime next week, however they are definite due to availability being based on discharges.   Patient does not meet criteria for detox beds at this time. Patient admitted on 08/03/2019.   MD is aware of lack of bed availability for residential treatment at this time. CSW will continue to follow and engage with patient to develop an alternative discharge plan.      Donald Carroll, MSW, LCSW Clinical Social Worker [facility]  Phone: [phone]

## 2019-08-08 NOTE — Progress Notes (Addendum)
[facility] MD Progress Note  08/08/2019 10:35 AM Donald Carroll  MRN:  [id number] Subjective: "I am feeling a little better today. I haven't been sleeping much."  Patient evaluated along with Dr. Jama Flavors. Patient states that he has been having difficulty sleeping. Reports that he awakens frequently and has negative and racing thoughts which make it difficult to fall back to sleep. He feels that it may be related to receiving a lower dose of Zyprexa. States that he has taking Zyprexa 30 mg QHS in the past. Discussed that 30 mg is above the recommended daily dose. Discussed increasing Zyprexa from 7.5 mg QHS to 10 mg QHS. Patient is in agreement. Reports that he has been trying to spend more time in the dayroom instead of isolating to his room. He is interested in residential treatment and aware that there may be waiting lists. No symptoms of withdraw. No tachycardia, no tremor, no diaphoresis, no agitation.   On evaluation patient is alert and oriented x 4, pleasant, and cooperative. Speech is clear and coherent. Mood is depressed. Affect is vaguely anxious.  Thought process is coherent, linear, descriptions of associations is intact. Future oriented. Denies suicidal ideations. Denies homicidal ideations. Denies audiovisual hallucinations. No indication that patient is responding to internal stimuli.    Principal Problem: MDD, Alcohol Use Disorder, Substance Induced Mood Disorder  Diagnosis: Active Problems:   MDD (major depressive disorder)  Total Time spent with patient: 20 minutes  Past Psychiatric History: See admission H&P  Past Medical History:  Past Medical History:  Diagnosis Date  . Anxiety   . Depression   . Hypertension    History reviewed. No pertinent surgical history. Family History: History reviewed. No pertinent family history. Family Psychiatric  History: See admission H&P Social History:  Social History   Substance and Sexual Activity  Alcohol Use Yes   Comment: drinking  daily past month     Social History   Substance and Sexual Activity  Drug Use Yes  . Types: Heroin    Social History   Socioeconomic History  . Marital status: Single    Spouse name: Not on file  . Number of children: Not on file  . Years of education: Not on file  . Highest education level: Not on file  Occupational History  . Not on file  Social Needs  . Financial resource strain: Not on file  . Food insecurity    Worry: Not on file    Inability: Not on file  . Transportation needs    Medical: Not on file    Non-medical: Not on file  Tobacco Use  . Smoking status: Current Every Day Smoker    Packs/day: 1.00    Types: Cigarettes  . Smokeless tobacco: Never Used  Substance and Sexual Activity  . Alcohol use: Yes    Comment: drinking daily past month  . Drug use: Yes    Types: Heroin  . Sexual activity: Not Currently  Lifestyle  . Physical activity    Days per week: Not on file    Minutes per session: Not on file  . Stress: Not on file  Relationships  . Social Musician on phone: Not on file    Gets together: Not on file    Attends religious service: Not on file    Active member of club or organization: Not on file    Attends meetings of clubs or organizations: Not on file    Relationship status: Not on file  Other Topics Concern  . Not on file  Social History Narrative  . Not on file   Additional Social History:   Sleep: Improving  Appetite:  Improving  Current Medications: Current Facility-Administered Medications  Medication Dose Route Frequency Provider Last Rate Last Dose  . acamprosate (CAMPRAL) tablet 666 mg  666 mg Oral TID [name], MD   666 mg at 08/08/19 0806  . acetaminophen (TYLENOL) tablet 650 mg  650 mg Oral Q6H PRN [name], NP      . alum & mag hydroxide-simeth (MAALOX/MYLANTA) 200-200-20 MG/5ML suspension 30 mL  30 mL Oral Q4H PRN [name], NP      . amLODipine (NORVASC) tablet 5 mg  5 mg Oral Daily  , Myer Peer, MD   5 mg at 08/08/19 0806  . DULoxetine (CYMBALTA) DR capsule 60 mg  60 mg Oral Daily [name], MD   60 mg at 08/08/19 0806  . gabapentin (NEURONTIN) capsule 400 mg  400 mg Oral TID , Myer Peer, MD   400 mg at 08/08/19 0806  . lisinopril (ZESTRIL) tablet 20 mg  20 mg Oral Daily , Myer Peer, MD   20 mg at 08/08/19 0806  . magnesium hydroxide (MILK OF MAGNESIA) suspension 30 mL  30 mL Oral Daily PRN [name], NP      . multivitamin with minerals tablet 1 tablet  1 tablet Oral Daily [name], NP   1 tablet at 08/08/19 0806  . nicotine (NICODERM CQ - dosed in mg/24 hours) patch 21 mg  21 mg Transdermal Daily [name], MD   21 mg at 08/08/19 0806  . OLANZapine (ZYPREXA) tablet 10 mg  10 mg Oral QHS ,  A, MD      . Oxcarbazepine (TRILEPTAL) tablet 300 mg  300 mg Oral BID [name], MD   300 mg at 08/08/19 0806  . thiamine (B-1) injection 100 mg  100 mg Intramuscular Once [name], NP      . thiamine (VITAMIN B-1) tablet 100 mg  100 mg Oral Daily [name], NP   100 mg at 08/08/19 0806    Lab Results:  Results for orders placed or performed during the hospital encounter of 08/03/19 (from the past 48 hour(s))  Basic metabolic panel     Status: Abnormal   Collection Time: 08/07/19  6:47 AM  Result Value Ref Range   Sodium 137 135 - 145 mmol/L   Potassium 4.1 3.5 - 5.1 mmol/L   Chloride 100 98 - 111 mmol/L   CO2 25 22 - 32 mmol/L   Glucose, Bld 111 (H) 70 - 99 mg/dL   BUN 16 6 - 20 mg/dL   Creatinine, Ser 0.97 0.61 - 1.24 mg/dL   Calcium 9.3 8.9 - 10.3 mg/dL   GFR calc non Af Amer >60 >60 mL/min   GFR calc Af Amer >60 >60 mL/min   Anion gap 12 5 - 15    Comment: Performed at [facility], [place] [address], [place], [place] [address]  Lipid panel     Status: Abnormal   Collection Time: 08/07/19  6:47 AM  Result Value Ref Range   Cholesterol 263 (H) 0 - 200 mg/dL   Triglycerides  242 (H) <150 mg/dL   HDL 37 (L) >40 mg/dL   Total CHOL/HDL Ratio 7.1 RATIO   VLDL 48 (H) 0 - 40 mg/dL   LDL Cholesterol 178 (H) 0 -  99 mg/dL    Comment:        Total Cholesterol/HDL:CHD Risk Coronary Heart Disease Risk Table                     Men   Women  1/2 Average Risk   3.4   3.3  Average Risk       5.0   4.4  2 X Average Risk   9.6   7.1  3 X Average Risk  23.4   11.0        Use the calculated Patient Ratio above and the CHD Risk Table to determine the patient's CHD Risk.        ATP III CLASSIFICATION (LDL):  <100     mg/dL   Optimal  [id number]  mg/dL   Near or Above                    Optimal  130-159  mg/dL   Borderline  [id number]  mg/dL   High  >811>190     mg/dL   Very High Performed at [facility], [address] [place], [place] [address]     Blood Alcohol level:  Lab Results  Component Value Date   ETH 57 (H) 08/03/2019   ETH 173 (H) 07/16/2018    Metabolic Disorder Labs: Lab Results  Component Value Date   HGBA1C 5.1 08/04/2019   MPG 99.67 08/04/2019   MPG 85.32 07/18/2018   No results found for: PROLACTIN Lab Results  Component Value Date   CHOL 263 (H) 08/07/2019   TRIG 242 (H) 08/07/2019   HDL 37 (L) 08/07/2019   CHOLHDL 7.1 08/07/2019   VLDL 48 (H) 08/07/2019   LDLCALC 178 (H) 08/07/2019   LDLCALC 70 07/18/2018    Physical Findings: AIMS: Facial and Oral Movements Muscles of Facial Expression: None, normal Lips and Perioral Area: None, normal Jaw: None, normal Tongue: None, normal,Extremity Movements Upper (arms, wrists, hands, fingers): None, normal Lower (legs, knees, ankles, toes): None, normal, Trunk Movements Neck, shoulders, hips: None, normal, Overall Severity Severity of abnormal movements (highest score from questions above): None, normal Incapacitation due to abnormal movements: None, normal Patient's awareness of abnormal movements (rate only patient's report): No Awareness, Dental Status Current problems  with teeth and/or dentures?: No Does patient usually wear dentures?: No  CIWA:  CIWA-Ar Total: 1 COWS:  COWS Total Score: 1  Musculoskeletal: Strength & Muscle Tone: within normal limits Gait & Station: normal Patient leans: N/A  Psychiatric Specialty Exam: Physical Exam  Nursing note and vitals reviewed. Constitutional: He is oriented to person, place, and time. He appears well-developed and well-nourished.  HENT:  Head: Normocephalic and atraumatic.  Respiratory: Effort normal.  Neurological: He is alert and oriented to person, place, and time.    ROS no headache, no chest pain, no dyspnea at room air, no vomiting, no fever, no chills.  Blood pressure (!) 135/98, pulse 94, temperature 98.4 F (36.9 C), temperature source Oral, resp. rate 20, height 5\' 11"  (1.803 m), weight 90.7 kg, SpO2 100 %.Body mass index is 27.89 kg/m.  General Appearance: Casual  Eye Contact: Good  Speech:  Normal Rate  Volume:  Normal  Mood:  Reports some persistent depression although does acknowledge improvement compared to admission.  Affect:  Remains vaguely constricted and anxious  Thought Process:  Linear and Descriptions of Associations: Intact  Orientation:  Other:  Fully alert and attentive  Thought Content:  No hallucination, no  delusions  Suicidal Thoughts:  Yes.  without intent/plan endorses some intermittent passive thoughts of death/dying but denies any suicidal plan or intention and presents future oriented.  Contracts for safety on unit.  Homicidal Thoughts:  No  Memory:  Recent and remote grossly intact  Judgement:  Fair/improving  Insight:  Fair/improving  Psychomotor Activity:  Normal-no current restlessness or agitation.  Concentration:  Concentration: Good and Attention Span: Good  Recall:  Good  Fund of Knowledge:  Good  Language:  Good  Akathisia:  Negative  Handed:  Right  AIMS (if indicated):     Assets:  Desire for Improvement Resilience  ADL's:  Intact  Cognition:   WNL  Sleep:  Number of Hours: 5.25    Treatment Plan Summary: Treatment plan reviewed as below today 10/15 Encourage group and milieu participation Encourage efforts to work on sobriety and relapse prevention Treatment team working on disposition options, as noted patient is interested in going to rehab after discharge.  Increase Zyprexa from 7.5 mg to 10 mg p.o. nightly for mood disorder and insomnia Continue Cymbalta to 60 mg  daily for anxiety and depression. Continue Gabapentin 400 mg p.o. 3 times daily for anxiety, alcohol use disorder, mood disorder Continue Lisinopril  20 mg p.o. daily for hypertension. Continue Amlodipine 5 mgrs daily  for hypertension Continue Lorazepam 1 mg p.o. every 6 hours PRN a CIWA greater than 10. Continue Thiamine and MVI supplementation Continue Trileptal 300 mg p.o. twice daily for mood disorder  Continue Acamprosate 666 tablets p.o. 3 times daily for alcohol dependence.   Jackelyn Poling, NP 08/08/2019, 10:35 AM   Patient ID: Marlowe Sax, male   DOB: 1965-11-13, 53 y.o.   MRN: [id number] Patient seen along with Barbara Cower, NP and case discussed with treatment team. He endorses some improvement compared to admission but describes ongoing depression and some anxiety.  Affect remains anxious but it is more reactive and he smiles briefly at times during session.  Denies suicidal ideations, presents future oriented.  He continues to ruminate about perceived high risk of relapsing should return to the community and continues to express hope to go to a rehab at discharge.  Rehab referrals have been put in place by CSW, replies currently pending. She describes fair sleep, states that in the past he has taken higher doses of Zyprexa for mood and for insomnia with good results. Currently denies medication side effects. Plan continue current management.  Increase olanzapine further to 10 mg nightly for mood and insomnia.  Treatment team working on disposition  options -awaiting response from rehab settings  F  MD

## 2019-08-09 NOTE — [facility] Group Notes (Signed)
LCSW Group Therapy Note 08/09/2019 1:29 PM  Type of Therapy/Topic: Group Therapy: Feelings about Diagnosis  Participation Level: Active   Description of Group:  This group will allow patients to explore their thoughts and feelings about diagnoses they have received. Patients will be guided to explore their level of understanding and acceptance of these diagnoses. Facilitator will encourage patients to process their thoughts and feelings about the reactions of others to their diagnosis and will guide patients in identifying ways to discuss their diagnosis with significant others in their lives. This group will be process-oriented, with patients participating in exploration of their own experiences, giving and receiving support, and processing challenge from other group members.  Therapeutic Goals: 1. Patient will demonstrate understanding of diagnosis as evidenced by identifying two or more symptoms of the disorder 2. Patient will be able to express two feelings regarding the diagnosis 3. Patient will demonstrate their ability to communicate their needs through discussion and/or role play  Summary of Patient Progress:  Donald Carroll was engaged and participated throughout the group session. Donald Carroll reports he was initially in denial when he learned he had a mental health diagnosis. He reports over the years, he has developed a "great" support system.     Therapeutic Modalities:  Cognitive Behavioral Therapy Brief Therapy Feelings Identification    [place] Clinical Social Worker

## 2019-08-09 NOTE — Progress Notes (Signed)
Recreation Therapy Notes  Date:  10.16.10 Time: 0930 Location: [address] Group Room  Group Topic: Stress Management  Goal Area(s) Addresses:  Patient will identify positive stress management techniques. Patient will identify benefits of using stress management post d/c.  Behavioral Response:  Engaged  Intervention:  Stress Management  Activity :  Guided Imagery.  LRT introduced the stress management technique of guided imagery.  LRT read a script that focused on peaceful waves at the beach.  Patients were to listen as meditation played to fully engage.  Education:  Stress Management, Discharge Planning.   Education Outcome: Acknowledges Education  Clinical Observations/Feedback:  Pt attended and participated in group.     Victorino Sparrow, LRT/CTRS     Ria Comment, Janira Mandell A 08/09/2019 12:00 PM

## 2019-08-09 NOTE — Progress Notes (Signed)
Patient ID: Broc W Weidemann, male   DOB: 03/31/1966, 53 y.o.   MRN: [id number]   Home NOVEL CORONAVIRUS (COVID-19) DAILY CHECK-OFF SYMPTOMS - answer yes or no to each - every day NO YES  Have you had a fever in the past 24 hours?  . Fever (Temp > 37.80C / 100F) X   Have you had any of these symptoms in the past 24 hours? . New Cough .  Sore Throat  .  Shortness of Breath .  Difficulty Breathing .  Unexplained Body Aches   X   Have you had any one of these symptoms in the past 24 hours not related to allergies?   . Runny Nose .  Nasal Congestion .  Sneezing   X   If you have had runny nose, nasal congestion, sneezing in the past 24 hours, has it worsened?  X   EXPOSURES - check yes or no X   Have you traveled outside the state in the past 14 days?  X   Have you been in contact with someone with a confirmed diagnosis of COVID-19 or PUI in the past 14 days without wearing appropriate PPE?  X   Have you been living in the same home as a person with confirmed diagnosis of COVID-19 or a PUI (household contact)?    X   Have you been diagnosed with COVID-19?    X              What to do next: Answered NO to all: Answered YES to anything:   Proceed with unit schedule Follow the [facility] Inpatient Flowsheet.   

## 2019-08-09 NOTE — Plan of Care (Signed)
  Problem: Activity: Goal: Interest or engagement in activities will improve Outcome: Progressing   Problem: Coping: Goal: Ability to demonstrate self-control will improve Outcome: Progressing   Problem: Safety: Goal: Periods of time without injury will increase Outcome: Progressing   

## 2019-08-09 NOTE — Tx Team (Signed)
Interdisciplinary Treatment and Diagnostic Plan Update  08/09/2019 Time of Session: 10:15am Donald Carroll MRN: [id number]  Principal Diagnosis: <principal problem not specified>  Secondary Diagnoses: Active Problems:   MDD (major depressive disorder)   Current Medications:  Current Facility-Administered Medications  Medication Dose Route Frequency Provider Last Rate Last Dose  . acamprosate (CAMPRAL) tablet 666 mg  666 mg Oral TID [name], MD   666 mg at 08/09/19 1153  . acetaminophen (TYLENOL) tablet 650 mg  650 mg Oral Q6H PRN [name], NP      . alum & mag hydroxide-simeth (MAALOX/MYLANTA) 200-200-20 MG/5ML suspension 30 mL  30 mL Oral Q4H PRN [name], NP      . amLODipine (NORVASC) tablet 5 mg  5 mg Oral Daily Cobos, Myer Peer, MD   5 mg at 08/09/19 0749  . DULoxetine (CYMBALTA) DR capsule 60 mg  60 mg Oral Daily [name], MD   60 mg at 08/09/19 0749  . gabapentin (NEURONTIN) capsule 400 mg  400 mg Oral TID Cobos, Myer Peer, MD   400 mg at 08/09/19 1153  . lisinopril (ZESTRIL) tablet 20 mg  20 mg Oral Daily Cobos, Myer Peer, MD   20 mg at 08/09/19 0749  . magnesium hydroxide (MILK OF MAGNESIA) suspension 30 mL  30 mL Oral Daily PRN [name], NP      . multivitamin with minerals tablet 1 tablet  1 tablet Oral Daily [name], NP   1 tablet at 08/09/19 0749  . nicotine (NICODERM CQ - dosed in mg/24 hours) patch 21 mg  21 mg Transdermal Daily [name], MD   21 mg at 08/09/19 0750  . OLANZapine (ZYPREXA) tablet 10 mg  10 mg Oral QHS Cobos, Myer Peer, MD   10 mg at 08/08/19 2059  . Oxcarbazepine (TRILEPTAL) tablet 300 mg  300 mg Oral BID [name], MD   300 mg at 08/09/19 0749  . thiamine (B-1) injection 100 mg  100 mg Intramuscular Once [name], NP      . thiamine (VITAMIN B-1) tablet 100 mg  100 mg Oral Daily [name], NP   100 mg at 08/09/19 0750   PTA Medications: Medications Prior to Admission   Medication Sig Dispense Refill Last Dose  . acamprosate (CAMPRAL) 333 MG tablet Take 2 tablets (666 mg total) by mouth 3 (three) times daily with meals. For alcohol cravings 180 tablet 0   . amLODipine (NORVASC) 5 MG tablet Take 1 tablet (5 mg total) by mouth daily. For high blood pressure 30 tablet 0   . DULoxetine (CYMBALTA) 60 MG capsule Take 1 capsule (60 mg total) by mouth daily. For mood control 30 capsule 0   . gabapentin (NEURONTIN) 400 MG capsule Take 1 capsule (400 mg total) by mouth 4 (four) times daily. For mood control and withdrawal symptoms 120 capsule 0   . hydrOXYzine (ATARAX/VISTARIL) 50 MG tablet Take 1 tablet (50 mg total) by mouth every 6 (six) hours as needed for anxiety. 30 tablet 0   . lisinopril (PRINIVIL,ZESTRIL) 40 MG tablet Take 1 tablet (40 mg total) by mouth daily. For high blood pressure 30 tablet 0   . OLANZapine (ZYPREXA) 20 MG tablet Take 1 tablet (20 mg total) by mouth at bedtime. For mood control 30 tablet 0   . Oxcarbazepine (TRILEPTAL) 300 MG tablet Take 1 tablet (300 mg total) by mouth 2 (two) times daily. For mood control  60 tablet 0   . traZODone (DESYREL) 100 MG tablet Take 2 tablets (200 mg total) by mouth at bedtime. For mood control 60 tablet 0     Patient Stressors: Medication change or noncompliance Substance abuse  Patient Strengths: Motivation for treatment/growth Supportive family/friends  Treatment Modalities: Medication Management, Group therapy, Case management,  1 to 1 session with clinician, Psychoeducation, Recreational therapy.   Physician Treatment Plan for Primary Diagnosis: <principal problem not specified> Long Term Goal(s): Improvement in symptoms so as ready for discharge Improvement in symptoms so as ready for discharge   Short Term Goals: Ability to identify changes in lifestyle to reduce recurrence of condition will improve Ability to verbalize feelings will improve Ability to disclose and discuss suicidal ideas Ability  to demonstrate self-control will improve Ability to identify and develop effective coping behaviors will improve Ability to maintain clinical measurements within normal limits will improve Compliance with prescribed medications will improve Ability to identify triggers associated with substance abuse/mental health issues will improve Ability to identify changes in lifestyle to reduce recurrence of condition will improve Ability to verbalize feelings will improve Ability to disclose and discuss suicidal ideas Ability to demonstrate self-control will improve Ability to identify and develop effective coping behaviors will improve Ability to maintain clinical measurements within normal limits will improve Compliance with prescribed medications will improve Ability to identify triggers associated with substance abuse/mental health issues will improve  Medication Management: Evaluate patient's response, side effects, and tolerance of medication regimen.  Therapeutic Interventions: 1 to 1 sessions, Unit Group sessions and Medication administration.  Evaluation of Outcomes: Progressing  Physician Treatment Plan for Secondary Diagnosis: Active Problems:   MDD (major depressive disorder)  Long Term Goal(s): Improvement in symptoms so as ready for discharge Improvement in symptoms so as ready for discharge   Short Term Goals: Ability to identify changes in lifestyle to reduce recurrence of condition will improve Ability to verbalize feelings will improve Ability to disclose and discuss suicidal ideas Ability to demonstrate self-control will improve Ability to identify and develop effective coping behaviors will improve Ability to maintain clinical measurements within normal limits will improve Compliance with prescribed medications will improve Ability to identify triggers associated with substance abuse/mental health issues will improve Ability to identify changes in lifestyle to reduce  recurrence of condition will improve Ability to verbalize feelings will improve Ability to disclose and discuss suicidal ideas Ability to demonstrate self-control will improve Ability to identify and develop effective coping behaviors will improve Ability to maintain clinical measurements within normal limits will improve Compliance with prescribed medications will improve Ability to identify triggers associated with substance abuse/mental health issues will improve     Medication Management: Evaluate patient's response, side effects, and tolerance of medication regimen.  Therapeutic Interventions: 1 to 1 sessions, Unit Group sessions and Medication administration.  Evaluation of Outcomes: Progressing   RN Treatment Plan for Primary Diagnosis: <principal problem not specified> Long Term Goal(s): Knowledge of disease and therapeutic regimen to maintain health will improve  Short Term Goals: Ability to demonstrate self-control, Ability to participate in decision making will improve, Ability to verbalize feelings will improve, Ability to disclose and discuss suicidal ideas, Ability to identify and develop effective coping behaviors will improve and Compliance with prescribed medications will improve  Medication Management: RN will administer medications as ordered by provider, will assess and evaluate patient's response and provide education to patient for prescribed medication. RN will report any adverse and/or side effects to prescribing provider.  Therapeutic Interventions:  1 on 1 counseling sessions, Psychoeducation, Medication administration, Evaluate responses to treatment, Monitor vital signs and CBGs as ordered, Perform/monitor CIWA, COWS, AIMS and Fall Risk screenings as ordered, Perform wound care treatments as ordered.  Evaluation of Outcomes: Progressing   LCSW Treatment Plan for Primary Diagnosis: <principal problem not specified> Long Term Goal(s): Safe transition to  appropriate next level of care at discharge, Engage patient in therapeutic group addressing interpersonal concerns.  Short Term Goals: Engage patient in aftercare planning with referrals and resources  Therapeutic Interventions: Assess for all discharge needs, 1 to 1 time with Social worker, Explore available resources and support systems, Assess for adequacy in community support network, Educate family and significant other(s) on suicide prevention, Complete Psychosocial Assessment, Interpersonal group therapy.  Evaluation of Outcomes: Progressing   Progress in Treatment: Attending groups: Yes. Participating in groups: Yes. Taking medication as prescribed: Yes. Toleration medication: Yes. Family/Significant other contact made: No, will contact:  the patient's wife Patient understands diagnosis: Yes. Discussing patient identified problems/goals with staff: Yes. Medical problems stabilized or resolved: Yes. Denies suicidal/homicidal ideation: Yes. Issues/concerns per patient self-inventory: No. Other:   New problem(s) identified: None   New Short Term/Long Term Goal(s): Detox, medication stabilization, elimination of SI thoughts, development of comprehensive mental wellness plan.    Patient Goals:  "To get stabilized and to not be suicidal"   Discharge Plan or Barriers: Patient recently admitted. Patient expressed interest in residential treatment services at discharge. [facility] currently reviewing. Patient may discharge home with outpatient medication and therapy services, while awaiting an open bed for treatment.  CSW will continue to follow and assess for appropriate referrals and possible discharge planning.    Reason for Continuation of Hospitalization: Anxiety Medication stabilization  Estimated Length of Stay: Discharge, Sunday 08/11/2019  Attendees: Patient: Donald Carroll  08/09/2019 2:41 PM  Physician: Dr. Landry MellowGreg Clary, MD; Dr. Nehemiah MassedFernando Cobos, MD 08/09/2019  2:41 PM  Nursing: Meriam SpragueBeverly.Kirtland BouchardK, RN; Ethelene BrownsAnthony.A, RN 08/09/2019 2:41 PM  RN Care Manager: 08/09/2019 2:41 PM  Social Worker: Baldo DaubJolan Lynzee Lindquist, LCSW 08/09/2019 2:41 PM  Recreational Therapist: Caroll RancherMarjette Lindsay 08/09/2019 2:41 PM  Other: Marciano SequinJanet Sykes, NP  08/09/2019 2:41 PM  Other: Norma FredricksonMichael Scearce, RN 08/09/2019 2:41 PM  Other: 08/09/2019 2:41 PM    Scribe for Treatment Team: Maeola SarahJolan E Yazmyn Valbuena, LCSWA 08/09/2019 2:41 PM

## 2019-08-09 NOTE — Progress Notes (Signed)
D: Patient denies SI, HI or AVH this evening. Patient presents as flat and minimal but pleasant and is visible in the milieu.  Pt. Expresses no new complaints or concerns.  A: Patient given emotional support from RN. Patient encouraged to come to staff with concerns and/or questions. Patient's medication routine continued. Patient's orders and plan of care reviewed.   R: Patient remains appropriate and cooperative. Will continue to monitor patient q15 minutes for safety.

## 2019-08-09 NOTE — Progress Notes (Signed)
[facility] MD Progress Note  08/09/2019 1:38 PM Donald Carroll  MRN:  [id number] Subjective: Patient reports partial/gradual improvement.  Today denies any suicidal ideations.  Tolerating medications well. Objective: Have discussed case with treatment team and have met with patient 53 year old male.  Prior history of alcohol, opiate, PTSD, and bipolar disorder diagnosis.  He presented to ED on 10/10 reporting depression, anxiety, poor sleep, suicidal ideations of overdosing on fentanyl.  He reports he had relapsed on alcohol several after period of several weeks of sobriety ( had not relapsed on Opiates ).  Admission BAL positive at 57, UDS positive for cannabis. Patient has had history of a prior psychiatric admission to [facility] on September 2019, following opiate overdose.  At that time he also reported depression, suicidal ideations.  Was discharged on Campral, Cymbalta, Neurontin, Zyprexa.  Today patient is presenting with partially improved mood and range of affect.  Acknowledges he is feeling better than on admission.  He is denying any suicidal ideations and remains future oriented.  He continues to be very focused and motivated and going to a rehab setting.  I met with patient along with social worker Lurline Idol) who has made referrals to residential rehab settings.  Patient informed that there is a waiting period for available rehabs.  He appears less concerned about this and states he could likely go home to wife and await for rehab bed to become available. He appears vaguely depressed/anxious although affect is noticeably improved and is  better able to work on disposition planning without increased feelings of anxiety or feeling overwhelmed. No disruptive or agitated behaviors on unit.  Visible in dayroom and going to some groups Staff report patient appears improved but still flat and vaguely constricted in affect. Tolerating medications well. No current withdrawal symptoms noted or  endorsed. Principal Problem: MDD, Alcohol Use Disorder, Substance Induced Mood Disorder  Diagnosis: Active Problems:   MDD (major depressive disorder)  Total Time spent with patient: 20 minutes  Past Psychiatric History: See admission H&P  Past Medical History:  Past Medical History:  Diagnosis Date  . Anxiety   . Depression   . Hypertension    History reviewed. No pertinent surgical history. Family History: History reviewed. No pertinent family history. Family Psychiatric  History: See admission H&P Social History:  Social History   Substance and Sexual Activity  Alcohol Use Yes   Comment: drinking daily past month     Social History   Substance and Sexual Activity  Drug Use Yes  . Types: Heroin    Social History   Socioeconomic History  . Marital status: Single    Spouse name: Not on file  . Number of children: Not on file  . Years of education: Not on file  . Highest education level: Not on file  Occupational History  . Not on file  Social Needs  . Financial resource strain: Not on file  . Food insecurity    Worry: Not on file    Inability: Not on file  . Transportation needs    Medical: Not on file    Non-medical: Not on file  Tobacco Use  . Smoking status: Current Every Day Smoker    Packs/day: 1.00    Types: Cigarettes  . Smokeless tobacco: Never Used  Substance and Sexual Activity  . Alcohol use: Yes    Comment: drinking daily past month  . Drug use: Yes    Types: Heroin  . Sexual activity: Not Currently  Lifestyle  . Physical  activity    Days per week: Not on file    Minutes per session: Not on file  . Stress: Not on file  Relationships  . Social Herbalist on phone: Not on file    Gets together: Not on file    Attends religious service: Not on file    Active member of club or organization: Not on file    Attends meetings of clubs or organizations: Not on file    Relationship status: Not on file  Other Topics Concern  . Not  on file  Social History Narrative  . Not on file   Additional Social History:   Sleep: Improving  Appetite:  Improving  Current Medications: Current Facility-Administered Medications  Medication Dose Route Frequency Provider Last Rate Last Dose  . acamprosate (CAMPRAL) tablet 666 mg  666 mg Oral TID [name], MD   666 mg at 08/09/19 1153  . acetaminophen (TYLENOL) tablet 650 mg  650 mg Oral Q6H PRN [name], NP      . alum & mag hydroxide-simeth (MAALOX/MYLANTA) 200-200-20 MG/5ML suspension 30 mL  30 mL Oral Q4H PRN [name], NP      . amLODipine (NORVASC) tablet 5 mg  5 mg Oral Daily Robertt Buda, Myer Peer, MD   5 mg at 08/09/19 0749  . DULoxetine (CYMBALTA) DR capsule 60 mg  60 mg Oral Daily [name], MD   60 mg at 08/09/19 0749  . gabapentin (NEURONTIN) capsule 400 mg  400 mg Oral TID Larae Caison, Myer Peer, MD   400 mg at 08/09/19 1153  . lisinopril (ZESTRIL) tablet 20 mg  20 mg Oral Daily Reeva Davern, Myer Peer, MD   20 mg at 08/09/19 0749  . magnesium hydroxide (MILK OF MAGNESIA) suspension 30 mL  30 mL Oral Daily PRN [name], NP      . multivitamin with minerals tablet 1 tablet  1 tablet Oral Daily [name], NP   1 tablet at 08/09/19 0749  . nicotine (NICODERM CQ - dosed in mg/24 hours) patch 21 mg  21 mg Transdermal Daily [name], MD   21 mg at 08/09/19 0750  . OLANZapine (ZYPREXA) tablet 10 mg  10 mg Oral QHS Kenise Barraco, Myer Peer, MD   10 mg at 08/08/19 2059  . Oxcarbazepine (TRILEPTAL) tablet 300 mg  300 mg Oral BID [name], MD   300 mg at 08/09/19 0749  . thiamine (B-1) injection 100 mg  100 mg Intramuscular Once [name], NP      . thiamine (VITAMIN B-1) tablet 100 mg  100 mg Oral Daily [name], NP   100 mg at 08/09/19 0750    Lab Results:  No results found for this or any previous visit (from the past 48 hour(s)).  Blood Alcohol level:  Lab Results  Component Value Date   ETH 57 (H) 08/03/2019   ETH  173 (H) 34/19/6222    Metabolic Disorder Labs: Lab Results  Component Value Date   HGBA1C 5.1 08/04/2019   MPG 99.67 08/04/2019   MPG 85.32 07/18/2018   No results found for: PROLACTIN Lab Results  Component Value Date   CHOL 263 (H) 08/07/2019   TRIG 242 (H) 08/07/2019   HDL 37 (L) 08/07/2019   CHOLHDL 7.1 08/07/2019   VLDL 48 (H) 08/07/2019   LDLCALC 178 (H) 08/07/2019   [place] 70 07/18/2018    Physical Findings: AIMS: Facial and Oral  Movements Muscles of Facial Expression: None, normal Lips and Perioral Area: None, normal Jaw: None, normal Tongue: None, normal,Extremity Movements Upper (arms, wrists, hands, fingers): None, normal Lower (legs, knees, ankles, toes): None, normal, Trunk Movements Neck, shoulders, hips: None, normal, Overall Severity Severity of abnormal movements (highest score from questions above): None, normal Incapacitation due to abnormal movements: None, normal Patient's awareness of abnormal movements (rate only patient's report): No Awareness, Dental Status Current problems with teeth and/or dentures?: No Does patient usually wear dentures?: No  CIWA:  CIWA-Ar Total: 2 COWS:  COWS Total Score: 1  Musculoskeletal: Strength & Muscle Tone: within normal limits Gait & Station: normal Patient leans: N/A  Psychiatric Specialty Exam: Physical Exam  Nursing note and vitals reviewed. Constitutional: He is oriented to person, place, and time. He appears well-developed and well-nourished.  HENT:  Head: Normocephalic and atraumatic.  Respiratory: Effort normal.  Neurological: He is alert and oriented to person, place, and time.    ROS no headache, no chest pain, no dyspnea at room air, no vomiting, no fever, no chills.  Blood pressure (!) 134/93, pulse 87, temperature (!) 97.5 F (36.4 C), temperature source Oral, resp. rate 16, height 5' 11"  (1.803 m), weight 90.7 kg, SpO2 100 %.Body mass index is 27.89 kg/m.  General Appearance: Casual  Eye  Contact: Good  Speech:  Normal Rate  Volume:  Normal  Mood:  Partially improved mood/less depressed.  Affect:  Still somewhat constricted and anxious but overall improving and becoming more reactive.  Thought Process:  Linear and Descriptions of Associations: Intact  Orientation:  Other:  Fully alert and attentive  Thought Content:  No hallucinations, no delusions and presents future oriented and more focused on disposition planning options  Suicidal Thoughts:  No currently denies suicidal or self-injurious ideations and contracts for safety on unit.  Homicidal Thoughts:  No  Memory:  Recent and remote grossly intact  Judgement:  Fair/improving  Insight:  Fair/improving  Psychomotor Activity:  Normal-no current restlessness or agitation.  Concentration:  Concentration: Good and Attention Span: Good  Recall:  Good  Fund of Knowledge:  Good  Language:  Good  Akathisia:  Negative  Handed:  Right  AIMS (if indicated):     Assets:  Desire for Improvement Resilience  ADL's:  Intact  Cognition:  WNL  Sleep:  Number of Hours: 6.75   Assessment:  53 year old male.  Prior history of alcohol, opiate, PTSD, and bipolar disorder diagnosis.  He presented to ED on 10/10 reporting depression, anxiety, poor sleep, suicidal ideations of overdosing on fentanyl.  He reports he had relapsed on alcohol several after period of several weeks of sobriety ( had not relapsed on Opiates ).  Admission BAL positive at 57, UDS positive for cannabis. Patient has had history of a prior psychiatric admission to [facility] on September 2019, following opiate overdose.  At that time he also reported depression, suicidal ideations.  Was discharged on Campral, Cymbalta, Neurontin, Zyprexa.  Today patient presents with improvement compared to admission.  Mood and range of affect are improved, although he still presents with a vaguely constricted and anxious affect.  He is future oriented, denying suicidal ideations, focusing on  discharge planning.  At this time he is aware that there may be a waiting period before he can go to a rehab setting and is reporting he would be able to go home for period of time while a bed becomes available.  Tolerating medications well and reports he slept better last night.  Treatment Plan Summary: Treatment plan reviewed as below today 10/16 Encourage group and milieu participation Encourage efforts to work on sobriety and relapse prevention Treatment team working on disposition options, as noted patient is interested in going to rehab after discharge.  Continue Zyprexa  10 mg p.o. nightly for mood disorder and insomnia Continue Cymbalta  60 mg  daily for anxiety and depression. Continue Gabapentin 400 mg p.o. 3 times daily for anxiety, alcohol use disorder, mood disorder Continue Lisinopril  20 mg p.o. daily for hypertension. Continue Amlodipine 5 mgrs daily  for hypertension Continue Lorazepam 1 mg p.o. every 6 hours PRN a CIWA greater than 10. Continue Thiamine and MVI supplementation Continue Trileptal 300 mg p.o. twice daily for mood disorder  Continue Acamprosate 666 tablets p.o. 3 times daily for alcohol dependence.   Jenne Campus, MD 08/09/2019, 1:38 PM   Patient ID: Donald Carroll, male   DOB: [birth year], 53 y.o.   MRN: [id number]
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
## 2019-08-10 NOTE — Progress Notes (Signed)
[place] NOVEL CORONAVIRUS (COVID-19) DAILY CHECK-OFF SYMPTOMS - answer yes or no to each - every day NO YES  Have you had a fever in the past 24 hours?  . Fever (Temp > 37.80C / 100F) X   Have you had any of these symptoms in the past 24 hours? . New Cough .  Sore Throat  .  Shortness of Breath .  Difficulty Breathing .  Unexplained Body Aches   X   Have you had any one of these symptoms in the past 24 hours not related to allergies?   . Runny Nose .  Nasal Congestion .  Sneezing   X   If you have had runny nose, nasal congestion, sneezing in the past 24 hours, has it worsened?  X   EXPOSURES - check yes or no X   Have you traveled outside the state in the past 14 days?  X   Have you been in contact with someone with a confirmed diagnosis of COVID-19 or PUI in the past 14 days without wearing appropriate PPE?  X   Have you been living in the same home as a person with confirmed diagnosis of COVID-19 or a PUI (household contact)?    X   Have you been diagnosed with COVID-19?    X              What to do next: Answered NO to all: Answered YES to anything:   Proceed with unit schedule Follow the [facility] Inpatient Flowsheet.   

## 2019-08-10 NOTE — Progress Notes (Signed)
[facility] MD Progress Note  08/10/2019 3:44 PM Donald Carroll  MRN:  [id number] Subjective: Describes improvement compared to admission.  States his mood is improving.  Also endorses decreased anxiety.  At present denies suicidal ideations and presents future oriented with a plan to return home at discharge while awaiting for a bed to become available at rehab setting. Currently does not endorse medication side effects. Objective: Have reviewed chart notes and have met with patient 53 year old male.  Prior history of alcohol, opiate, PTSD, and bipolar disorder diagnosis.  He presented to ED on 10/10 reporting depression, anxiety, poor sleep, suicidal ideations of overdosing on fentanyl.  He reports he had relapsed on alcohol several after period of several weeks of sobriety ( had not relapsed on Opiates ).  Admission BAL positive at 57, UDS positive for cannabis. Patient has had history of a prior psychiatric admission to [facility] on September 2019, following opiate overdose.  At that time he also reported depression, suicidal ideations.  Was discharged on Campral, Cymbalta, Neurontin, Zyprexa.  Patient is presenting with a gradually improving mood and range of affect.  Depression and severe anxiety related to discharge and disposition planning have abated and continues to present invested in current disposition plan, which is to return home while a bed becomes available at rehab.  States he plans to attend AA which has been helpful in the past. Also reports he will have the support of his wife with whom he lives. No disruptive or agitated behaviors on unit, visible in the room, polite on approach. Staff reports that patient has been visible in dayroom, interacting appropriately with peers, generally better but still vaguely flat and anxious in affect. Tolerating medications well.  Currently on Zyprexa, Trileptal, Neurontin, Cymbalta, Campral.  Denies medication side effects.  Of note, had been taking the  medications prior to admission and reports that in general has tolerated them well with good response. Principal Problem: MDD, Alcohol Use Disorder, Substance Induced Mood Disorder  Diagnosis: Active Problems:   MDD (major depressive disorder)  Total Time spent with patient: 15 minutes  Past Psychiatric History: See admission H&P  Past Medical History:  Past Medical History:  Diagnosis Date  . Anxiety   . Depression   . Hypertension    History reviewed. No pertinent surgical history. Family History: History reviewed. No pertinent family history. Family Psychiatric  History: See admission H&P Social History:  Social History   Substance and Sexual Activity  Alcohol Use Yes   Comment: drinking daily past month     Social History   Substance and Sexual Activity  Drug Use Yes  . Types: Heroin    Social History   Socioeconomic History  . Marital status: Single    Spouse name: Not on file  . Number of children: Not on file  . Years of education: Not on file  . Highest education level: Not on file  Occupational History  . Not on file  Social Needs  . Financial resource strain: Not on file  . Food insecurity    Worry: Not on file    Inability: Not on file  . Transportation needs    Medical: Not on file    Non-medical: Not on file  Tobacco Use  . Smoking status: Current Every Day Smoker    Packs/day: 1.00    Types: Cigarettes  . Smokeless tobacco: Never Used  Substance and Sexual Activity  . Alcohol use: Yes    Comment: drinking daily past month  .  Drug use: Yes    Types: Heroin  . Sexual activity: Not Currently  Lifestyle  . Physical activity    Days per week: Not on file    Minutes per session: Not on file  . Stress: Not on file  Relationships  . Social Herbalist on phone: Not on file    Gets together: Not on file    Attends religious service: Not on file    Active member of club or organization: Not on file    Attends meetings of clubs or  organizations: Not on file    Relationship status: Not on file  Other Topics Concern  . Not on file  Social History Narrative  . Not on file   Additional Social History:   Sleep: Improving  Appetite:  Improving  Current Medications: Current Facility-Administered Medications  Medication Dose Route Frequency Provider Last Rate Last Dose  . acamprosate (CAMPRAL) tablet 666 mg  666 mg Oral TID [name], MD   666 mg at 08/10/19 1159  . acetaminophen (TYLENOL) tablet 650 mg  650 mg Oral Q6H PRN [name], NP      . alum & mag hydroxide-simeth (MAALOX/MYLANTA) 200-200-20 MG/5ML suspension 30 mL  30 mL Oral Q4H PRN [name], NP      . amLODipine (NORVASC) tablet 5 mg  5 mg Oral Daily Pete Merten, Myer Peer, MD   5 mg at 08/10/19 7989  . DULoxetine (CYMBALTA) DR capsule 60 mg  60 mg Oral Daily [name], MD   60 mg at 08/10/19 2119  . gabapentin (NEURONTIN) capsule 400 mg  400 mg Oral TID Chiante Peden, Myer Peer, MD   400 mg at 08/10/19 1159  . lisinopril (ZESTRIL) tablet 20 mg  20 mg Oral Daily Leighla Chestnutt, Myer Peer, MD   20 mg at 08/10/19 4174  . magnesium hydroxide (MILK OF MAGNESIA) suspension 30 mL  30 mL Oral Daily PRN [name], NP      . multivitamin with minerals tablet 1 tablet  1 tablet Oral Daily [name], NP   1 tablet at 08/10/19 0821  . nicotine (NICODERM CQ - dosed in mg/24 hours) patch 21 mg  21 mg Transdermal Daily [name], MD   21 mg at 08/10/19 0825  . OLANZapine (ZYPREXA) tablet 10 mg  10 mg Oral QHS Lillan Mccreadie, Myer Peer, MD   10 mg at 08/09/19 2120  . Oxcarbazepine (TRILEPTAL) tablet 300 mg  300 mg Oral BID [name], MD   300 mg at 08/10/19 0814  . thiamine (B-1) injection 100 mg  100 mg Intramuscular Once [name], NP      . thiamine (VITAMIN B-1) tablet 100 mg  100 mg Oral Daily [name], NP   100 mg at 08/10/19 4818    Lab Results:  No results found for this or any previous visit (from the past 48  hour(s)).  Blood Alcohol level:  Lab Results  Component Value Date   ETH 57 (H) 08/03/2019   ETH 173 (H) 56/31/4970    Metabolic Disorder Labs: Lab Results  Component Value Date   HGBA1C 5.1 08/04/2019   MPG 99.67 08/04/2019   MPG 85.32 07/18/2018   No results found for: PROLACTIN Lab Results  Component Value Date   CHOL 263 (H) 08/07/2019   TRIG 242 (H) 08/07/2019   HDL 37 (L) 08/07/2019   CHOLHDL 7.1 08/07/2019   VLDL 48 (H) 08/07/2019  LDLCALC 178 (H) 08/07/2019   [place] 70 07/18/2018    Physical Findings: AIMS: Facial and Oral Movements Muscles of Facial Expression: None, normal Lips and Perioral Area: None, normal Jaw: None, normal Tongue: None, normal,Extremity Movements Upper (arms, wrists, hands, fingers): None, normal Lower (legs, knees, ankles, toes): None, normal, Trunk Movements Neck, shoulders, hips: None, normal, Overall Severity Severity of abnormal movements (highest score from questions above): None, normal Incapacitation due to abnormal movements: None, normal Patient's awareness of abnormal movements (rate only patient's report): No Awareness, Dental Status Current problems with teeth and/or dentures?: No Does patient usually wear dentures?: No  CIWA:  CIWA-Ar Total: 1 COWS:  COWS Total Score: 1  Musculoskeletal: Strength & Muscle Tone: within normal limits Gait & Station: normal Patient leans: N/A  Psychiatric Specialty Exam: Physical Exam  Nursing note and vitals reviewed. Constitutional: He is oriented to person, place, and time. He appears well-developed and well-nourished.  HENT:  Head: Normocephalic and atraumatic.  Respiratory: Effort normal.  Neurological: He is alert and oriented to person, place, and time.    ROS no headache, no chest pain, no dyspnea at room air, no vomiting, no fever, no chills.  Blood pressure (!) 141/100, pulse 85, temperature (!) 97.3 F (36.3 C), temperature source Oral, resp. rate 16, height 5' 11"   (1.803 m), weight 90.7 kg, SpO2 95 %.Body mass index is 27.89 kg/m.  General Appearance: Improving grooming  Eye Contact: Good  Speech:  Normal Rate  Volume:  Normal  Mood:  Gradually improving mood, reports feeling better  Affect:  Improving affect, more reactive, still vaguely anxious  Thought Process:  Linear and Descriptions of Associations: Intact  Orientation:  Other:  Fully alert and attentive  Thought Content:  No hallucinations, no delusions and presents future oriented and more focused on disposition planning options  Suicidal Thoughts:  No currently denies suicidal or self-injurious ideations and contracts for safety on unit.  Homicidal Thoughts:  No  Memory:  Recent and remote grossly intact  Judgement:  improving  Insight:  improving  Psychomotor Activity:  Normal-no current restlessness or agitation.  Concentration:  Concentration: Good and Attention Span: Good  Recall:  Good  Fund of Knowledge:  Good  Language:  Good  Akathisia:  Negative  Handed:  Right  AIMS (if indicated):     Assets:  Desire for Improvement Resilience  ADL's:  Intact  Cognition:  WNL  Sleep:  Number of Hours: 6.25   Assessment:  53 year old male.  Prior history of alcohol, opiate, PTSD, and bipolar disorder diagnosis.  He presented to ED on 10/10 reporting depression, anxiety, poor sleep, suicidal ideations of overdosing on fentanyl.  He reports he had relapsed on alcohol several after period of several weeks of sobriety ( had not relapsed on Opiates ).  Admission BAL positive at 57, UDS positive for cannabis. Patient has had history of a prior psychiatric admission to [facility] on September 2019, following opiate overdose.  At that time he also reported depression, suicidal ideations.  Was discharged on Campral, Cymbalta, Neurontin, Zyprexa.  Patient presents with gradual but noticeable improvement compared to admission.  Mood and affect have improved, remains vaguely anxious but currently future  oriented and focused on disposition planning options.  He had hoped to be able to go straight from unit to a rehab but due to waiting list he may have to wait for several days before a bed becomes available.  Initially anxious about this but currently less so, with a plan of  returning home for a few days while he awaits acceptance to a program.  Seems highly motivated in sobriety at this time and cravings to drink have been abating.  Tolerating medications well at this time, denies SI.  Treatment Plan Summary: Treatment plan reviewed as below today 10/17 Encourage group and milieu participation Encourage efforts to work on sobriety and relapse prevention Treatment team working on disposition options, anticipate discharge home soon as he continues to stabilize.  Continue Zyprexa  10 mg p.o. nightly for mood disorder and insomnia Continue Cymbalta  60 mg  daily for anxiety and depression. Continue Gabapentin 400 mg p.o. 3 times daily for anxiety, alcohol use disorder, mood disorder Continue Lisinopril  20 mg p.o. daily for hypertension. Continue Amlodipine 5 mgrs daily  for hypertension Continue Lorazepam 1 mg p.o. every 6 hours PRN a CIWA greater than 10. Continue Thiamine and MVI supplementation Continue Trileptal 300 mg p.o. twice daily for mood disorder  Continue Acamprosate 666 tablets p.o. 3 times daily for alcohol dependence.   Jenne Campus, MD 08/10/2019, 3:44 PM   Patient ID: Donald Carroll, male   DOB: 1966/01/23, 53 y.o.   MRN: [id number]

## 2019-08-10 NOTE — Progress Notes (Signed)
Patient has been up in the dayroom. He attended group this evening and has voiced no complaints. Patient currently denies having pain, -si/hi/a/v hall. Support and encouragement offered, safety maintained on unit, will continue to monitor.

## 2019-08-10 NOTE — Progress Notes (Signed)
D. Pt presents as flat, but is friendly during interactions and is visible in the dayroom interacting appropriately with peers.Pt currently denies SI/HI and AVH  A. Labs and vitals monitored. Pt compliant with medications. Pt supported emotionally and encouraged to express concerns and ask questions.   R. Pt remains safe with 15 minute checks. Will continue POC.

## 2019-08-11 MED ORDER — DULOXETINE HCL 60 MG PO CPEP: 60.0000 mg | ORAL_CAPSULE | Freq: Every day | ORAL | 0 refills | Status: DC

## 2019-08-11 MED ORDER — OLANZAPINE 10 MG PO TABS: 10.0000 mg | ORAL_TABLET | Freq: Every day | ORAL | 0 refills | Status: DC

## 2019-08-11 MED ORDER — AMLODIPINE BESYLATE 5 MG PO TABS: 5.0000 mg | ORAL_TABLET | Freq: Every day | ORAL | 0 refills | Status: DC

## 2019-08-11 MED ORDER — NICOTINE 21 MG/24HR TD PT24: 21.0000 mg | MEDICATED_PATCH | Freq: Every day | TRANSDERMAL | 0 refills | Status: DC

## 2019-08-11 MED ORDER — OXCARBAZEPINE 300 MG PO TABS: 300.0000 mg | ORAL_TABLET | Freq: Two times a day (BID) | ORAL | 0 refills | Status: DC

## 2019-08-11 MED ORDER — LISINOPRIL 20 MG PO TABS: 20.0000 mg | ORAL_TABLET | Freq: Every day | ORAL | 0 refills | Status: DC

## 2019-08-11 MED ORDER — ACAMPROSATE CALCIUM 333 MG PO TBEC: 666.0000 mg | DELAYED_RELEASE_TABLET | Freq: Three times a day (TID) | ORAL | 0 refills | Status: DC

## 2019-08-11 MED ORDER — ADULT MULTIVITAMIN W/MINERALS CH: 1.0000 | ORAL_TABLET | Freq: Every day | ORAL | Status: DC

## 2019-08-11 MED ORDER — GABAPENTIN 400 MG PO CAPS: 400.0000 mg | ORAL_CAPSULE | Freq: Three times a day (TID) | ORAL | 0 refills | Status: DC

## 2019-08-11 NOTE — [facility] Suicide Risk Assessment (Signed)
[facility] Discharge Suicide Risk Assessment   Principal Problem: depression, alcohol use disorder  Discharge Diagnoses: Active Problems:   MDD (major depressive disorder)   Total Time spent with patient: 30 minutes  Musculoskeletal: Strength & Muscle Tone: within normal limits Gait & Station: normal Patient leans: N/A  Psychiatric Specialty Exam: ROS denies chest pain, no shortness of breath, no cough, no vomiting, no rash   Blood pressure (!) 132/97, pulse 83, temperature 97.8 F (36.6 C), temperature source Oral, resp. rate 16, height 5\' 11"  (1.803 m), weight 90.7 kg, SpO2 95 %.Body mass index is 27.89 kg/m.  General Appearance: Well Groomed  Eye Contact::  Good  Speech:  Normal Rate409  Volume:  Normal  Mood:  reports improved mood , states " I am feeling a lot better than when I came in"  Affect:  Appropriate and more reactive  Thought Process:  Linear and Descriptions of Associations: Intact  Orientation:  Full (Time, Place, and Person)  Thought Content:  no hallucinations, no delusions  Suicidal Thoughts:  No denies suicidal or self injurious ideations, denies HI   Homicidal Thoughts:  No  Memory:  recent and remote grossly intact   Judgement:  Other:  improving   Insight:  improving   Psychomotor Activity:  Normal  Concentration:  Good  Recall:  Good  Fund of Knowledge:Good  Language: Good  Akathisia:  Negative  Handed:  Right  AIMS (if indicated):     Assets:  Communication Skills Desire for Improvement Resilience  Sleep:  Number of Hours: 6.75  Cognition: WNL  ADL's:  Intact   Mental Status Per Nursing Assessment::   On Admission:  Suicidal ideation indicated by patient  Demographic Factors:  53 year old married male   Loss Factors: Job loss, recent relapse   Historical Factors: History of alcohol use disorder, history of depression  Risk Reduction Factors:   Sense of responsibility to family, Living with another person, especially a relative and  Positive coping skills or problem solving skills  Continued Clinical Symptoms:  At this time patient is alert, attentive, well related, mood improved and currently presents euthymic, affect appropriate and fuller in range, no thought disorder, no SI or HI, no psychotic features, future oriented, plans to go to a rehab once a bed becomes available Behavior on unit calm and in good control, pleasant on approach Denies medication side effects. Side effects reviewed, including potential risk of increased weight, sedation , movement and metabolic disorders associated with Zyprexa  Cognitive Features That Contribute To Risk:  No gross cognitive deficits noted upon discharge. Is alert , attentive, and oriented x 3   Suicide Risk:  Mild:  Suicidal ideation of limited frequency, intensity, duration, and specificity.  There are no identifiable plans, no associated intent, mild dysphoria and related symptoms, good self-control (both objective and subjective assessment), few other risk factors, and identifiable protective factors, including available and accessible social support.  Follow-up Information    [facility].   Why: Referral made by CSW. Currently no available beds. Admissions expect some open beds potentially next week.  Please call and inquire about bed availabilty DAILY for potential placement.  Contact information: [address] [place] [address]        [facility] Follow up on 08/13/2019.   Why: Please attend your hospital follow up appointment on Tuesday, 10/20 at 2:00p.  Be sure to bring your photo ID, insurance card and current medications.  Contact  information: [place] [address] ph: [phone] fx: [phone]          Plan Of Care/Follow-up recommendations:  Activity:  as tolerated  Diet:  heart healthy Tests:  NA Other:  See below  Patient is expressing readiness for discharge and  is leaving unit in good spirits, plans to return home while he awaits for a rehab bed to become available to him. States wife will be picking him up later today. Plans to follow up as above . Plans to follow up with PCP for medication management, offered referral to [facility].  Jenne Campus, MD 08/11/2019, 8:21 AM

## 2019-08-11 NOTE — Progress Notes (Signed)
Pt discharged to lobby. Pt was stable and appreciative at that time. All papers and prescriptions were given and valuables returned. Verbal understanding expressed. Denies SI/HI and A/VH. Pt given opportunity to express concerns and ask questions.  

## 2019-08-11 NOTE — Progress Notes (Signed)
D.  Pt pleasant on approach, no complaints voiced.  Pt was positive for evening AA group, observed positively engaged with peers on the unit.  Pt  Denies SI/HI/AVH at this time.  A.  Support and encouragement offered, medication given as ordered  R.  Pt remains safe on the unit, will continue to monitor.

## 2019-08-11 NOTE — Progress Notes (Signed)
[place] NOVEL CORONAVIRUS (COVID-19) DAILY CHECK-OFF SYMPTOMS - answer yes or no to each - every day NO YES  Have you had a fever in the past 24 hours?  . Fever (Temp > 37.80C / 100F) X   Have you had any of these symptoms in the past 24 hours? . New Cough .  Sore Throat  .  Shortness of Breath .  Difficulty Breathing .  Unexplained Body Aches   X   Have you had any one of these symptoms in the past 24 hours not related to allergies?   . Runny Nose .  Nasal Congestion .  Sneezing   X   If you have had runny nose, nasal congestion, sneezing in the past 24 hours, has it worsened?  X   EXPOSURES - check yes or no X   Have you traveled outside the state in the past 14 days?  X   Have you been in contact with someone with a confirmed diagnosis of COVID-19 or PUI in the past 14 days without wearing appropriate PPE?  X   Have you been living in the same home as a person with confirmed diagnosis of COVID-19 or a PUI (household contact)?    X   Have you been diagnosed with COVID-19?    X              What to do next: Answered NO to all: Answered YES to anything:   Proceed with unit schedule Follow the [facility] Inpatient Flowsheet.   

## 2019-08-11 NOTE — Progress Notes (Signed)
  [facility] Adult Case Management Discharge Plan :  Will you be returning to the same living situation after discharge:  Yes,  with wife At discharge, do you have transportation home?: Yes,  arranged by patient Do you have the ability to pay for your medications: No.  No income, no insurance - goes to [facility]  Release of information consent forms completed and turned in to Medical Records by CSW.   Patient to Follow up at: [facility].   Why: Referral made by CSW. Currently no available beds. Admissions expect some open beds potentially next week.  Please call and inquire about bed availability DAILY for potential placement.  Contact information: [place] [address] [phone]        [facility] Follow up on 08/13/2019.   Why: Please attend your hospital follow up appointment on Tuesday, 10/20 at 2:00p.  Be sure to bring your photo ID, insurance card and current medications.  Contact information: [place] [address] ph: [phone] fx: [phone]          Next level of care provider has access to [facility] [employer] and Suicide Prevention discussed: Yes,  with wife     Has patient been referred to the Quitline?: Patient refused referral  Patient has been referred for addiction treatment: Yes  Maretta Los, LCSW 08/11/2019, 9:26 AM

## 2019-08-11 NOTE — Progress Notes (Signed)
D. Pt is friendly during interactions- is visible in the milieu interacting appropriately with peers and staff. Per pt's self inventory, pt rated his depression, hopelessness and anxiety a 2/0/2, respectively. Pt writes that his goal today is "going home". Pt reports that his family has been a strong support system for him. Pt currently denies SI/HI and AVH   A. Labs and vitals monitored. Pt compliant with medications. Pt supported emotionally and encouraged to express concerns and ask questions.   R. Pt remains safe with 15 minute checks. Will continue POC.

## 2019-08-11 NOTE — Discharge Summary (Addendum)
Physician Discharge Summary Note  Patient:  Donald Carroll is an 53 y.o., male MRN:  [id number] DOB:  08/25/1966 Patient phone:  [phone] (home)  Patient address:   [address] [place] [id number],  Total Time spent with patient: 15 minutes  Date of Admission:  08/03/2019 Date of Discharge: 08/11/19  Reason for Admission:  suicidal ideation  Principal Problem: <principal problem not specified> Discharge Diagnoses: Active Problems:   Alcohol dependence with alcohol-induced mood disorder (HCC)   MDD (major depressive disorder)   Past Psychiatric History: Patient has the above-stated past psychiatric history he has been admitted to the hospital on several occasions.  He has been previously diagnosed with posttraumatic stress disorder, bipolar disorder, anxiety, depression and polysubstance dependence.  This would be his fifth inpatient admission.  He has been seen as an outpatient at [facility].  He reportedly is attempted to kill himself x3 with overdoses of heroin.  Past Medical History:  Past Medical History:  Diagnosis Date  . Anxiety   . Depression   . Hypertension    History reviewed. No pertinent surgical history. Family History: History reviewed. No pertinent family history. Family Psychiatric  History: Denies Social History:  Social History   Substance and Sexual Activity  Alcohol Use Yes   Comment: drinking daily past month     Social History   Substance and Sexual Activity  Drug Use Yes  . Types: Heroin    Social History   Socioeconomic History  . Marital status: Single    Spouse name: Not on file  . Number of children: Not on file  . Years of education: Not on file  . Highest education level: Not on file  Occupational History  . Not on file  Social Needs  . Financial resource strain: Not on file  . Food insecurity    Worry: Not on file    Inability: Not on file  . Transportation needs    Medical: Not on file    Non-medical: Not on  file  Tobacco Use  . Smoking status: Current Every Day Smoker    Packs/day: 1.00    Types: Cigarettes  . Smokeless tobacco: Never Used  Substance and Sexual Activity  . Alcohol use: Yes    Comment: drinking daily past month  . Drug use: Yes    Types: Heroin  . Sexual activity: Not Currently  Lifestyle  . Physical activity    Days per week: Not on file    Minutes per session: Not on file  . Stress: Not on file  Relationships  . Social Musicianconnections    Talks on phone: Not on file    Gets together: Not on file    Attends religious service: Not on file    Active member of club or organization: Not on file    Attends meetings of clubs or organizations: Not on file    Relationship status: Not on file  Other Topics Concern  . Not on file  Social History Narrative  . Not on file    Hospital Course:  From admission H&P: Patient is a 53 year old male with a past psychiatric history significant for alcohol dependence, opiate dependence and a reported history of PTSD and bipolar disorder who [facility] to [facility] department on 08/03/2019 with suicidal ideation. The patient stated that he had been sober for approximately 4 months. He stated that approximately 2 weeks ago he had relapsed again. He began to drink, and use  drugs. The patient stated that after his hospitalization in September 2019 he went to a residential substance abuse program for approximately 6 months. He remained sober for 4 months after he was released from that facility. He stated that he and his wife have been having problems, and that led to his relapse. He is requesting to "get back on my medicines". As stated above, his last hospitalization here was on 07/17/2018. He remained in the hospital until 07/24/2018. His discharge medications included acamprosate, Cymbalta, gabapentin, hydroxyzine, Zyprexa, Trileptal and trazodone. He stated that he had been taking his medications, although  he had not followed up with psychiatry. He stated that his primary care provider was writing these medications for him. He was admitted to the hospital for evaluation and stabilization.  Donald Carroll was admitted for suicidal ideation after recent relapse on alcohol. He remained on the [facility] unit for eight days. He reported he had stopped Campral due to financial concerns and relapsed on alcohol two weeks prior to admission. CIWA protocol was started with Ativan PRN CIWA>10 for alcohol withdrawal. He was restarted on Campral. Neurontin, Trileptal, Cymbalta, and Zyprexa were continued. He participated in group therapy on the unit. He responded well to treatment with no adverse effects reported. He has shown improved mood, affect, sleep, and interaction. On day of discharge, he presents with euthymic affect and reports good mood. He is future-oriented, with plans to continue treatment with inpatient rehab and/or outpatient follow-up. He denies any SI/HI/AVH and contracts for safety. He denies withdrawal symptoms or cravings. He is discharging on the medications listed below. He agrees to follow up with [facility] and [facility] (see below). He is provided with prescriptions for medications upon discharge. He reports that he and his wife have created a budget that will allow him to afford the Campral. His wife is picking him up for discharge home.  Physical Findings: AIMS: Facial and Oral Movements Muscles of Facial Expression: None, normal Lips and Perioral Area: None, normal Jaw: None, normal Tongue: None, normal,Extremity Movements Upper (arms, wrists, hands, fingers): None, normal Lower (legs, knees, ankles, toes): None, normal, Trunk Movements Neck, shoulders, hips: None, normal, Overall Severity Severity of abnormal movements (highest score from questions above): None, normal Incapacitation due to abnormal movements: None, normal Patient's awareness of abnormal movements (rate only patient's report): No  Awareness, Dental Status Current problems with teeth and/or dentures?: No Does patient usually wear dentures?: No  CIWA:  CIWA-Ar Total: 0 COWS:  COWS Total Score: 1  Musculoskeletal: Strength & Muscle Tone: within normal limits Gait & Station: normal Patient leans: N/A  Psychiatric Specialty Exam: Physical Exam  Nursing note and vitals reviewed. Constitutional: He is oriented to person, place, and time. He appears well-developed and well-nourished.  Cardiovascular: Normal rate.  Respiratory: Effort normal.  Neurological: He is alert and oriented to person, place, and time.    Review of Systems  Constitutional: Negative.   Respiratory: Negative for cough and shortness of breath.   Cardiovascular: Negative for chest pain.  Psychiatric/Behavioral: Positive for depression (stable on medication) and substance abuse (ETOH, THC). Negative for hallucinations and suicidal ideas. The patient is not nervous/anxious and does not have insomnia.     Blood pressure (!) 132/97, pulse 83, temperature 97.8 F (36.6 C), temperature source Oral, resp. rate 16, height 5\' 11"  (1.803 m), weight 90.7 kg, SpO2 95 %.Body mass index is 27.89 kg/m.  See MD's discharge SRA      Has this patient used any form of tobacco in  the last 30 days? (Cigarettes, Smokeless Tobacco, Cigars, and/or Pipes) Yes, a prescription for an FDA-approved medication for tobacco cessation was offered at discharge.   Blood Alcohol level:  Lab Results  Component Value Date   ETH 57 (H) 08/03/2019   ETH 173 (H) 07/16/2018    Metabolic Disorder Labs:  Lab Results  Component Value Date   HGBA1C 5.1 08/04/2019   MPG 99.67 08/04/2019   MPG 85.32 07/18/2018   No results found for: PROLACTIN Lab Results  Component Value Date   CHOL 263 (H) 08/07/2019   TRIG 242 (H) 08/07/2019   HDL 37 (L) 08/07/2019   CHOLHDL 7.1 08/07/2019   VLDL 48 (H) 08/07/2019   LDLCALC 178 (H) 08/07/2019   LDLCALC 70 07/18/2018    See Psychiatric  Specialty Exam and Suicide Risk Assessment completed by Attending Physician prior to discharge.  Discharge destination:  Home  Is patient on multiple antipsychotic therapies at discharge:  No   Has Patient had three or more failed trials of antipsychotic monotherapy by history:  No  Recommended Plan for Multiple Antipsychotic Therapies: NA  Discharge Instructions    Discharge instructions   Complete by: As directed    Patient is instructed to take all prescribed medications as recommended. Report any side effects or adverse reactions to your outpatient psychiatrist. Patient is instructed to abstain from alcohol and illegal drugs while on prescription medications. In the event of worsening symptoms, patient is instructed to call the crisis hotline, 911, or go to the nearest emergency department for evaluation and treatment.     Allergies as of 08/11/2019   No Known Allergies     Medication List    STOP taking these medications   hydrOXYzine 50 MG tablet Commonly known as: ATARAX/VISTARIL   traZODone 100 MG tablet Commonly known as: DESYREL     TAKE these medications     Indication  acamprosate 333 MG tablet Commonly known as: CAMPRAL Take 2 tablets (666 mg total) by mouth 3 (three) times daily. What changed:   when to take this  additional instructions  Indication: Abuse or Misuse of Alcohol   amLODipine 5 MG tablet Commonly known as: NORVASC Take 1 tablet (5 mg total) by mouth daily. Start taking on: August 12, 2019 What changed: additional instructions  Indication: High Blood Pressure Disorder   DULoxetine 60 MG capsule Commonly known as: CYMBALTA Take 1 capsule (60 mg total) by mouth daily. Start taking on: August 12, 2019 What changed: additional instructions  Indication: Major Depressive Disorder   gabapentin 400 MG capsule Commonly known as: NEURONTIN Take 1 capsule (400 mg total) by mouth 3 (three) times daily. What changed:   when to take  this  additional instructions  Indication: Neuropathic Pain   lisinopril 20 MG tablet Commonly known as: ZESTRIL Take 1 tablet (20 mg total) by mouth daily. Start taking on: August 12, 2019 What changed:   medication strength  how much to take  additional instructions  Indication: High Blood Pressure Disorder   multivitamin with minerals Tabs tablet Take 1 tablet by mouth daily. Start taking on: August 12, 2019  Indication: Supplementation   nicotine 21 mg/24hr patch Commonly known as: NICODERM CQ - dosed in mg/24 hours Place 1 patch (21 mg total) onto the skin daily. Start taking on: August 12, 2019  Indication: Nicotine Addiction   OLANZapine 10 MG tablet Commonly known as: ZYPREXA Take 1 tablet (10 mg total) by mouth at bedtime. What changed:   medication strength  how much to take  additional instructions  Indication: Depressive Phase of Manic-Depression   Oxcarbazepine 300 MG tablet Commonly known as: TRILEPTAL Take 1 tablet (300 mg total) by mouth 2 (two) times daily. What changed: additional instructions  Indication: Mood      Follow-up Information    [facility].   Why: Referral made by CSW. Currently no available beds. Admissions expect some open beds potentially next week.  Please call and inquire about bed availabilty DAILY for potential placement.  Contact information: [address] [place] [address] [phone]        [facility] Follow up on 08/13/2019.   Why: Please attend your hospital follow up appointment on Tuesday, 10/20 at 2:00p.  Be sure to bring your photo ID, insurance card and current medications.  Contact information: [address] [place] [address] ph: [phone] fx: [phone]          Follow-up recommendations: Activity as tolerated. Diet as recommended by primary care physician. Keep all scheduled follow-up appointments as recommended.   Comments:    Patient is instructed to take all prescribed medications as recommended. Report any side effects or adverse reactions to your outpatient psychiatrist. Patient is instructed to abstain from alcohol and illegal drugs while on prescription medications. In the event of worsening symptoms, patient is instructed to call the crisis hotline, 911, or go to the nearest emergency department for evaluation and treatment.  Signed: Aldean Baker, NP 08/11/2019, 9:44 AM   Patient seen, Suicide Assessment Completed.  Disposition Plan Reviewed

## 2019-08-11 NOTE — [facility] Group Notes (Signed)
[facility] LCSW Group Therapy Note  08/11/2019  10:00-11:00AM  Type of Therapy and Topic:  Group Therapy:  A Hero Worthy of Support  Participation Level:  Active   Description of Group:  Patients in this group were introduced to the concept that additional supports including self-support are an essential part of recovery.  Matching needs with supports to help fulfill those needs was explained.  A song "I Got To Live" was played for the group and was followed by a discussion of what it meant to participants.   The consensus was that the message was to give themselves permission to see happiness in life.  A song entitled "My Own Hero" was played and a group discussion ensued in which patients stated it inspired them to help themselves in order to succeed, because other people cannot achieve their goals such as sobriety or stability for them.  A song was played called "I Am Enough" which led to a discussion about being willing to believe we are worth the effort of being a self-support.   Therapeutic Goals: 1)  demonstrate the importance of being a key part of one's own support system 2)  discuss various available supports 3)  encourage patient to use music as part of their self-support and focus on goals 4)  elicit ideas from patients about supports that need to be added   Summary of Patient Progress:  The patient expressed that he has realized while in the hospital that the happiest he has been was during his recent 1 year of sobriety, which "made me feel alive."  He wants to return to that, he said.  He was attentive and supportive of others throughout group.  Therapeutic Modalities:   Motivational Interviewing Activity  Maretta Los

## 2020-02-04 ENCOUNTER — Other Ambulatory Visit: Payer: Self-pay

## 2020-02-04 ENCOUNTER — Emergency Department (HOSPITAL_COMMUNITY): Payer: Self-pay

## 2020-02-04 ENCOUNTER — Inpatient Hospital Stay (HOSPITAL_COMMUNITY)
Admission: EM | Admit: 2020-02-04 | Discharge: 2020-02-13 | DRG: 871 | Disposition: A | Discharge status: Home Health | Payer: Self-pay | Attending: Internal Medicine | Admitting: Internal Medicine

## 2020-02-04 ENCOUNTER — Inpatient Hospital Stay (HOSPITAL_COMMUNITY): Payer: Self-pay

## 2020-02-04 DIAGNOSIS — N179 Acute kidney failure, unspecified: Secondary | ICD-10-CM | POA: Diagnosis present

## 2020-02-04 DIAGNOSIS — F319 Bipolar disorder, unspecified: Secondary | ICD-10-CM | POA: Diagnosis present

## 2020-02-04 DIAGNOSIS — K760 Fatty (change of) liver, not elsewhere classified: Secondary | ICD-10-CM | POA: Diagnosis present

## 2020-02-04 DIAGNOSIS — Z4659 Encounter for fitting and adjustment of other gastrointestinal appliance and device: Secondary | ICD-10-CM

## 2020-02-04 DIAGNOSIS — I959 Hypotension, unspecified: Secondary | ICD-10-CM

## 2020-02-04 DIAGNOSIS — F1024 Alcohol dependence with alcohol-induced mood disorder: Secondary | ICD-10-CM | POA: Diagnosis present

## 2020-02-04 DIAGNOSIS — F4310 Post-traumatic stress disorder, unspecified: Secondary | ICD-10-CM | POA: Diagnosis present

## 2020-02-04 DIAGNOSIS — R7401 Elevation of levels of liver transaminase levels: Secondary | ICD-10-CM

## 2020-02-04 DIAGNOSIS — J9601 Acute respiratory failure with hypoxia: Secondary | ICD-10-CM | POA: Diagnosis present

## 2020-02-04 DIAGNOSIS — E781 Pure hyperglyceridemia: Secondary | ICD-10-CM | POA: Diagnosis not present

## 2020-02-04 DIAGNOSIS — F05 Delirium due to known physiological condition: Secondary | ICD-10-CM | POA: Diagnosis not present

## 2020-02-04 DIAGNOSIS — E872 Acidosis: Secondary | ICD-10-CM | POA: Diagnosis present

## 2020-02-04 DIAGNOSIS — J690 Pneumonitis due to inhalation of food and vomit: Secondary | ICD-10-CM | POA: Diagnosis present

## 2020-02-04 DIAGNOSIS — E870 Hyperosmolality and hypernatremia: Secondary | ICD-10-CM | POA: Diagnosis not present

## 2020-02-04 DIAGNOSIS — J80 Acute respiratory distress syndrome: Secondary | ICD-10-CM | POA: Diagnosis present

## 2020-02-04 DIAGNOSIS — J9600 Acute respiratory failure, unspecified whether with hypoxia or hypercapnia: Secondary | ICD-10-CM

## 2020-02-04 DIAGNOSIS — E875 Hyperkalemia: Secondary | ICD-10-CM | POA: Diagnosis present

## 2020-02-04 DIAGNOSIS — Z9981 Dependence on supplemental oxygen: Secondary | ICD-10-CM

## 2020-02-04 DIAGNOSIS — Z8249 Family history of ischemic heart disease and other diseases of the circulatory system: Secondary | ICD-10-CM

## 2020-02-04 DIAGNOSIS — E871 Hypo-osmolality and hyponatremia: Secondary | ICD-10-CM | POA: Diagnosis present

## 2020-02-04 DIAGNOSIS — Z781 Physical restraint status: Secondary | ICD-10-CM

## 2020-02-04 DIAGNOSIS — F1110 Opioid abuse, uncomplicated: Secondary | ICD-10-CM | POA: Diagnosis present

## 2020-02-04 DIAGNOSIS — G9341 Metabolic encephalopathy: Secondary | ICD-10-CM | POA: Diagnosis present

## 2020-02-04 DIAGNOSIS — E861 Hypovolemia: Secondary | ICD-10-CM | POA: Diagnosis present

## 2020-02-04 DIAGNOSIS — F419 Anxiety disorder, unspecified: Secondary | ICD-10-CM | POA: Diagnosis present

## 2020-02-04 DIAGNOSIS — R45851 Suicidal ideations: Secondary | ICD-10-CM | POA: Diagnosis present

## 2020-02-04 DIAGNOSIS — F29 Unspecified psychosis not due to a substance or known physiological condition: Secondary | ICD-10-CM | POA: Diagnosis present

## 2020-02-04 DIAGNOSIS — F17210 Nicotine dependence, cigarettes, uncomplicated: Secondary | ICD-10-CM | POA: Diagnosis present

## 2020-02-04 DIAGNOSIS — R451 Restlessness and agitation: Secondary | ICD-10-CM | POA: Diagnosis not present

## 2020-02-04 DIAGNOSIS — A419 Sepsis, unspecified organism: Principal | ICD-10-CM | POA: Diagnosis present

## 2020-02-04 DIAGNOSIS — Z978 Presence of other specified devices: Secondary | ICD-10-CM

## 2020-02-04 DIAGNOSIS — G92 Toxic encephalopathy: Secondary | ICD-10-CM | POA: Diagnosis present

## 2020-02-04 DIAGNOSIS — Z9911 Dependence on respirator [ventilator] status: Secondary | ICD-10-CM

## 2020-02-04 DIAGNOSIS — E8770 Fluid overload, unspecified: Secondary | ICD-10-CM | POA: Diagnosis not present

## 2020-02-04 DIAGNOSIS — I10 Essential (primary) hypertension: Secondary | ICD-10-CM | POA: Diagnosis present

## 2020-02-04 DIAGNOSIS — Z452 Encounter for adjustment and management of vascular access device: Secondary | ICD-10-CM

## 2020-02-04 DIAGNOSIS — Z79899 Other long term (current) drug therapy: Secondary | ICD-10-CM

## 2020-02-04 DIAGNOSIS — R339 Retention of urine, unspecified: Secondary | ICD-10-CM | POA: Diagnosis present

## 2020-02-04 DIAGNOSIS — R0603 Acute respiratory distress: Secondary | ICD-10-CM

## 2020-02-04 DIAGNOSIS — Z20822 Contact with and (suspected) exposure to covid-19: Secondary | ICD-10-CM | POA: Diagnosis present

## 2020-02-04 DIAGNOSIS — R6520 Severe sepsis without septic shock: Secondary | ICD-10-CM | POA: Diagnosis present

## 2020-02-04 LAB — URINALYSIS, ROUTINE W REFLEX MICROSCOPIC
Bilirubin Urine: NEGATIVE
Glucose, UA: NEGATIVE mg/dL
Ketones, ur: NEGATIVE mg/dL
Leukocytes,Ua: NEGATIVE
Nitrite: NEGATIVE
Protein, ur: 30 mg/dL — AB
Specific Gravity, Urine: 1.017 (ref 1.005–1.030)
pH: 5 (ref 5.0–8.0)

## 2020-02-04 LAB — CBC WITH DIFFERENTIAL/PLATELET
Abs Immature Granulocytes: 0.5 10*3/uL — ABNORMAL HIGH (ref 0.00–0.07)
Band Neutrophils: 12 %
Basophils Absolute: 0 10*3/uL (ref 0.0–0.1)
Basophils Relative: 0 %
Eosinophils Absolute: 0 10*3/uL (ref 0.0–0.5)
Eosinophils Relative: 0 %
HCT: 42.1 % (ref 39.0–52.0)
Hemoglobin: 14.2 g/dL (ref 13.0–17.0)
Lymphocytes Relative: 3 %
Lymphs Abs: 0.7 10*3/uL (ref 0.7–4.0)
MCH: 32.2 pg (ref 26.0–34.0)
MCHC: 33.7 g/dL (ref 30.0–36.0)
MCV: 95.5 fL (ref 80.0–100.0)
Metamyelocytes Relative: 2 %
Monocytes Absolute: 1.2 10*3/uL — ABNORMAL HIGH (ref 0.1–1.0)
Monocytes Relative: 5 %
Neutro Abs: 22 10*3/uL — ABNORMAL HIGH (ref 1.7–7.7)
Neutrophils Relative %: 78 %
Platelets: 254 10*3/uL (ref 150–400)
RBC: 4.41 MIL/uL (ref 4.22–5.81)
RDW: 14 % (ref 11.5–15.5)
WBC: 24.4 10*3/uL — ABNORMAL HIGH (ref 4.0–10.5)
nRBC: 0 % (ref 0.0–0.2)

## 2020-02-04 LAB — TROPONIN I (HIGH SENSITIVITY)
Troponin I (High Sensitivity): 7 ng/L (ref <18)
Troponin I (High Sensitivity): 7 ng/L (ref <18)

## 2020-02-04 LAB — CBG MONITORING, ED: Glucose-Capillary: 111 mg/dL — ABNORMAL HIGH (ref 70–99)

## 2020-02-04 LAB — PROTIME-INR
INR: 1.2 (ref 0.8–1.2)
Prothrombin Time: 14.7 seconds (ref 11.4–15.2)

## 2020-02-04 LAB — RAPID URINE DRUG SCREEN, HOSP PERFORMED
Amphetamines: NOT DETECTED
Barbiturates: NOT DETECTED
Benzodiazepines: NOT DETECTED
Cocaine: NOT DETECTED
Opiates: NOT DETECTED
Tetrahydrocannabinol: NOT DETECTED

## 2020-02-04 LAB — SALICYLATE LEVEL: Salicylate Lvl: <7 mg/dL — ABNORMAL LOW (ref 7.0–30.0)

## 2020-02-04 LAB — COMPREHENSIVE METABOLIC PANEL
ALT: 63 U/L — ABNORMAL HIGH (ref 0–44)
AST: 103 U/L — ABNORMAL HIGH (ref 15–41)
Albumin: 3.5 g/dL (ref 3.5–5.0)
Alkaline Phosphatase: 110 U/L (ref 38–126)
Anion gap: 20 — ABNORMAL HIGH (ref 5–15)
BUN: 91 mg/dL — ABNORMAL HIGH (ref 6–20)
CO2: 17 mmol/L — ABNORMAL LOW (ref 22–32)
Calcium: 8.3 mg/dL — ABNORMAL LOW (ref 8.9–10.3)
Chloride: 89 mmol/L — ABNORMAL LOW (ref 98–111)
Creatinine, Ser: 6.35 mg/dL — ABNORMAL HIGH (ref 0.61–1.24)
GFR calc Af Amer: 11 mL/min — ABNORMAL LOW (ref >60)
GFR calc non Af Amer: 9 mL/min — ABNORMAL LOW (ref >60)
Glucose, Bld: 106 mg/dL — ABNORMAL HIGH (ref 70–99)
Potassium: 5.4 mmol/L — ABNORMAL HIGH (ref 3.5–5.1)
Sodium: 126 mmol/L — ABNORMAL LOW (ref 135–145)
Total Bilirubin: 1.1 mg/dL (ref 0.3–1.2)
Total Protein: 7.8 g/dL (ref 6.5–8.1)

## 2020-02-04 LAB — LACTIC ACID, PLASMA
Lactic Acid, Venous: 1.1 mmol/L (ref 0.5–1.9)
Lactic Acid, Venous: 0.8 mmol/L (ref 0.5–1.9)

## 2020-02-04 LAB — RESPIRATORY PANEL BY RT PCR (FLU A&B, COVID)
Influenza A by PCR: NEGATIVE
Influenza B by PCR: NEGATIVE
SARS Coronavirus 2 by RT PCR: NEGATIVE

## 2020-02-04 LAB — ETHANOL: Alcohol, Ethyl (B): <10 mg/dL (ref <10)

## 2020-02-04 LAB — ACETAMINOPHEN LEVEL: Acetaminophen (Tylenol), Serum: <10 ug/mL — ABNORMAL LOW (ref 10–30)

## 2020-02-04 LAB — AMMONIA: Ammonia: 9 umol/L (ref 9–35)

## 2020-02-04 LAB — LIPASE, BLOOD: Lipase: 14 U/L (ref 11–51)

## 2020-02-04 MED ORDER — SODIUM CHLORIDE 0.9 % IV SOLN
2.0000 g | INTRAVENOUS | Status: AC
  Administered 2020-02-05 – 2020-02-11 (×7): 2 g via INTRAVENOUS
  Filled 2020-02-04: qty 20
  Filled 2020-02-04: qty 2
  Filled 2020-02-04 (×3): qty 20
  Filled 2020-02-04: qty 2
  Filled 2020-02-04 (×2): qty 20

## 2020-02-04 MED ORDER — SODIUM CHLORIDE 0.9 % IV SOLN: INTRAVENOUS | Status: DC

## 2020-02-04 MED ORDER — SODIUM CHLORIDE 0.9 % IV BOLUS
1000.0000 mL | Freq: Once | INTRAVENOUS | Status: AC
  Administered 2020-02-04: 1000 mL via INTRAVENOUS

## 2020-02-04 MED ORDER — THIAMINE HCL 100 MG/ML IJ SOLN
100.0000 mg | Freq: Every day | INTRAMUSCULAR | Status: DC
  Administered 2020-02-04 – 2020-02-06 (×3): 100 mg via INTRAVENOUS
  Filled 2020-02-04 (×4): qty 2

## 2020-02-04 MED ORDER — ADULT MULTIVITAMIN W/MINERALS CH: 1.0000 | ORAL_TABLET | Freq: Every day | ORAL | Status: DC

## 2020-02-04 MED ORDER — SODIUM CHLORIDE 0.9 % IV SOLN
500.0000 mg | INTRAVENOUS | Status: AC
  Administered 2020-02-05 – 2020-02-11 (×7): 500 mg via INTRAVENOUS
  Filled 2020-02-04 (×7): qty 500

## 2020-02-04 MED ORDER — FOLIC ACID 1 MG PO TABS: 1.0000 mg | ORAL_TABLET | Freq: Every day | ORAL | Status: DC

## 2020-02-04 MED ORDER — VANCOMYCIN VARIABLE DOSE PER UNSTABLE RENAL FUNCTION (PHARMACIST DOSING): Status: DC

## 2020-02-04 MED ORDER — HEPARIN SODIUM (PORCINE) 5000 UNIT/ML IJ SOLN
5000.0000 [IU] | Freq: Three times a day (TID) | INTRAMUSCULAR | Status: DC
  Administered 2020-02-04 – 2020-02-13 (×24): 5000 [IU] via SUBCUTANEOUS
  Filled 2020-02-04 (×24): qty 1

## 2020-02-04 MED ORDER — LORAZEPAM 2 MG/ML IJ SOLN
1.0000 mg | INTRAMUSCULAR | Status: DC | PRN
  Administered 2020-02-05: 1 mg via INTRAVENOUS
  Administered 2020-02-05 – 2020-02-06 (×3): 2 mg via INTRAVENOUS
  Filled 2020-02-04 (×4): qty 1

## 2020-02-04 MED ORDER — SODIUM BICARBONATE 8.4 % IV SOLN
50.0000 meq | Freq: Once | INTRAVENOUS | Status: AC
  Administered 2020-02-04: 50 meq via INTRAVENOUS
  Filled 2020-02-04: qty 50

## 2020-02-04 MED ORDER — SODIUM CHLORIDE 0.9 % IV SOLN
500.0000 mg | Freq: Once | INTRAVENOUS | Status: AC
  Administered 2020-02-04: 500 mg via INTRAVENOUS
  Filled 2020-02-04: qty 500

## 2020-02-04 MED ORDER — SODIUM CHLORIDE 0.9 % IV SOLN
1.0000 g | Freq: Once | INTRAVENOUS | Status: AC
  Administered 2020-02-04: 1 g via INTRAVENOUS
  Filled 2020-02-04: qty 10

## 2020-02-04 MED ORDER — VANCOMYCIN HCL 1750 MG/350ML IV SOLN
1750.0000 mg | INTRAVENOUS | Status: AC
  Administered 2020-02-05: 1750 mg via INTRAVENOUS
  Filled 2020-02-04: qty 350

## 2020-02-04 MED ORDER — THIAMINE HCL 100 MG PO TABS
100.0000 mg | ORAL_TABLET | Freq: Every day | ORAL | Status: DC
  Administered 2020-02-07: 100 mg via ORAL
  Filled 2020-02-04: qty 1

## 2020-02-04 MED ORDER — LORAZEPAM 1 MG PO TABS: 1.0000 mg | ORAL_TABLET | ORAL | Status: DC | PRN

## 2020-02-04 NOTE — ED Provider Notes (Signed)
[facility]-EMERGENCY DEPT Provider Note   CSN: [id number] Arrival date & time: 02/04/20  1705     History Chief Complaint  Patient presents with  . Altered Mental Status    Donald Carroll is a 54 y.o. male.  The history is provided by the patient and medical records. No language interpreter was used.  Altered Mental Status Presenting symptoms: confusion and partial responsiveness   Severity:  Severe Most recent episode:  Yesterday Episode history:  Single Timing:  Constant Progression:  Waxing and waning Chronicity:  New Context: not dementia   Associated symptoms: no abdominal pain, no agitation, no fever, no headaches, no light-headedness, no nausea, no palpitations, no seizures, no slurred speech, no vomiting and no weakness   Associated symptoms comment:  Urinary retention      Past Medical History:  Diagnosis Date  . Anxiety   . Depression   . Hypertension     Patient Active Problem List   Diagnosis Date Noted  . MDD (major depressive disorder) 08/03/2019  . PTSD (post-traumatic stress disorder) 07/18/2018  . Alcohol dependence with alcohol-induced mood disorder (HCC) 07/18/2018  . Bipolar I disorder, current or most recent episode depressed, with psychotic features (HCC) 07/17/2018    No past surgical history on file.     No family history on file.  Social History   Tobacco Use  . Smoking status: Current Every Day Smoker    Packs/day: 1.00    Types: Cigarettes  . Smokeless tobacco: Never Used  Substance Use Topics  . Alcohol use: Yes    Comment: drinking daily past month  . Drug use: Yes    Types: Heroin    Home Medications Prior to Admission medications   Medication Sig Start Date End Date Taking? Authorizing Provider  acamprosate (CAMPRAL) 333 MG tablet Take 2 tablets (666 mg total) by mouth 3 (three) times daily. 08/11/19   Aldean BakerSykes, Janet E, NP  amLODipine (NORVASC) 5 MG tablet Take 1 tablet (5 mg total) by mouth  daily. 08/12/19   Aldean BakerSykes, Janet E, NP  DULoxetine (CYMBALTA) 60 MG capsule Take 1 capsule (60 mg total) by mouth daily. 08/12/19   Aldean BakerSykes, Janet E, NP  gabapentin (NEURONTIN) 400 MG capsule Take 1 capsule (400 mg total) by mouth 3 (three) times daily. 08/11/19   Aldean BakerSykes, Janet E, NP  lisinopril (ZESTRIL) 20 MG tablet Take 1 tablet (20 mg total) by mouth daily. 08/12/19   Aldean BakerSykes, Janet E, NP  Multiple Vitamin (MULTIVITAMIN WITH MINERALS) TABS tablet Take 1 tablet by mouth daily. 08/12/19   Aldean BakerSykes, Janet E, NP  nicotine (NICODERM CQ - DOSED IN MG/24 HOURS) 21 mg/24hr patch Place 1 patch (21 mg total) onto the skin daily. 08/12/19   Aldean BakerSykes, Janet E, NP  OLANZapine (ZYPREXA) 10 MG tablet Take 1 tablet (10 mg total) by mouth at bedtime. 08/11/19   Aldean BakerSykes, Janet E, NP  Oxcarbazepine (TRILEPTAL) 300 MG tablet Take 1 tablet (300 mg total) by mouth 2 (two) times daily. 08/11/19   Aldean BakerSykes, Janet E, NP    Allergies    Patient has no known allergies.  Review of Systems   Review of Systems  Constitutional: Negative for chills, fatigue and fever.  HENT: Negative for congestion.   Eyes: Negative for visual disturbance.  Respiratory: Negative for cough, chest tightness, shortness of breath and wheezing.   Cardiovascular: Negative for chest pain and palpitations.  Gastrointestinal: Negative for abdominal pain, constipation, diarrhea, nausea and vomiting.  Genitourinary: Positive for decreased  urine volume. Negative for dysuria, flank pain and frequency.       Marylee Floras retention  Musculoskeletal: Negative for back pain, neck pain and neck stiffness.  Skin: Negative for wound.  Neurological: Positive for tremors. Negative for seizures, syncope, speech difficulty, weakness, light-headedness, numbness and headaches.  Psychiatric/Behavioral: Positive for confusion. Negative for agitation.  All other systems reviewed and are negative.   Physical Exam Updated Vital Signs BP (!) 101/56   Pulse (!) 107   Temp 97.8 F  (36.6 C) (Rectal)   Resp 16   SpO2 92%   Physical Exam Vitals and nursing note reviewed.  Constitutional:      General: He is not in acute distress.    Appearance: He is well-developed. He is ill-appearing. He is not toxic-appearing or diaphoretic.  HENT:     Head: Normocephalic and atraumatic.     Nose: No congestion or rhinorrhea.     Mouth/Throat:     Mouth: Mucous membranes are dry.     Pharynx: No oropharyngeal exudate or posterior oropharyngeal erythema.  Eyes:     Extraocular Movements: Extraocular movements intact.     Conjunctiva/sclera: Conjunctivae normal.     Pupils: Pupils are equal, round, and reactive to light.  Cardiovascular:     Rate and Rhythm: Regular rhythm. Tachycardia present.     Pulses: Normal pulses.     Heart sounds: No murmur.  Pulmonary:     Effort: Pulmonary effort is normal. No respiratory distress.     Breath sounds: Rhonchi present. No wheezing or rales.  Chest:     Chest wall: No tenderness.  Abdominal:     General: Abdomen is flat. There is no distension.     Palpations: Abdomen is soft.     Tenderness: There is no abdominal tenderness. There is no right CVA tenderness, left CVA tenderness, guarding or rebound.  Musculoskeletal:        General: No tenderness.     Cervical back: Neck supple. No tenderness.     Right lower leg: No edema.     Left lower leg: No edema.  Skin:    General: Skin is warm and dry.     Capillary Refill: Capillary refill takes less than 2 seconds.     Findings: No erythema.  Neurological:     Mental Status: He is alert. He is disoriented.     Sensory: No sensory deficit.     Motor: No weakness.     ED Results / Procedures / Treatments   Labs (all labs ordered are listed, but only abnormal results are displayed) Labs Reviewed  CBC WITH DIFFERENTIAL/PLATELET - Abnormal; Notable for the following components:      Result Value   WBC 24.4 (*)    Neutro Abs 22.0 (*)    Monocytes Absolute 1.2 (*)    Abs  Immature Granulocytes 0.50 (*)    All other components within normal limits  COMPREHENSIVE METABOLIC PANEL - Abnormal; Notable for the following components:   Sodium 126 (*)    Potassium 5.4 (*)    Chloride 89 (*)    CO2 17 (*)    Glucose, Bld 106 (*)    BUN 91 (*)    Creatinine, Ser 6.35 (*)    Calcium 8.3 (*)    AST 103 (*)    ALT 63 (*)    GFR calc non Af Amer 9 (*)    GFR calc Af Amer 11 (*)    Anion gap 20 (*)  All other components within normal limits  SALICYLATE LEVEL - Abnormal; Notable for the following components:   Salicylate Lvl <6.9 (*)    All other components within normal limits  ACETAMINOPHEN LEVEL - Abnormal; Notable for the following components:   Acetaminophen (Tylenol), Serum <10 (*)    All other components within normal limits  URINALYSIS, ROUTINE W REFLEX MICROSCOPIC - Abnormal; Notable for the following components:   Color, Urine AMBER (*)    APPearance HAZY (*)    Hgb urine dipstick SMALL (*)    Protein, ur 30 (*)    Bacteria, UA RARE (*)    All other components within normal limits  BRAIN NATRIURETIC PEPTIDE - Abnormal; Notable for the following components:   B Natriuretic Peptide 252.7 (*)    All other components within normal limits  CBG MONITORING, ED - Abnormal; Notable for the following components:   Glucose-Capillary 111 (*)    All other components within normal limits  RESPIRATORY PANEL BY RT PCR (FLU A&B, COVID)  URINE CULTURE  CULTURE, BLOOD (ROUTINE X 2)  CULTURE, BLOOD (ROUTINE X 2)  CULTURE, BLOOD (ROUTINE X 2)  CULTURE, BLOOD (ROUTINE X 2)  EXPECTORATED SPUTUM ASSESSMENT W REFEX TO RESP CULTURE  LIPASE, BLOOD  AMMONIA  LACTIC ACID, PLASMA  LACTIC ACID, PLASMA  PROTIME-INR  ETHANOL  RAPID URINE DRUG SCREEN, HOSP PERFORMED  HIV ANTIBODY (ROUTINE TESTING W REFLEX)  CBC  COMPREHENSIVE METABOLIC PANEL  LEGIONELLA PNEUMOPHILA SEROGP 1 UR AG  STREP PNEUMONIAE URINARY ANTIGEN  MAGNESIUM  PHOSPHORUS  PROCALCITONIN  ETHYLENE  GLYCOL  SODIUM, URINE, RANDOM  CREATININE, URINE, RANDOM  CK  TROPONIN I (HIGH SENSITIVITY)  TROPONIN I (HIGH SENSITIVITY)    EKG EKG Interpretation  Date/Time:  Tuesday February 04 2020 17:42:59 EDT Ventricular Rate:  108 PR Interval:    QRS Duration: 104 QT Interval:  334 QTC Calculation: 448 R Axis:   -83 Text Interpretation: Sinus tachycardia Ventricular premature complex Aberrant conduction of SV complex(es) Probable left atrial enlargement Left axis deviation When compared to prior, faster rate. No STEMI Confirmed by Antony Blackbird [phone]) on 02/04/2020 7:41:32 PM   Radiology CT Head Wo Contrast  Result Date: 02/04/2020 CLINICAL DATA:  Encephalopathy EXAM: CT HEAD WITHOUT CONTRAST TECHNIQUE: Contiguous axial images were obtained from the base of the skull through the vertex without intravenous contrast. COMPARISON:  None. FINDINGS: Brain: There is no mass, hemorrhage or extra-axial collection. The size and configuration of the ventricles and extra-axial CSF spaces are normal. The brain parenchyma is normal, without acute or chronic infarction. Vascular: No abnormal hyperdensity of the major intracranial arteries or dural venous sinuses. No intracranial atherosclerosis. Skull: The visualized skull base, calvarium and extracranial soft tissues are normal. Sinuses/Orbits: No fluid levels or advanced mucosal thickening of the visualized paranasal sinuses. No mastoid or middle ear effusion. The orbits are normal. IMPRESSION: Normal head CT. Electronically Signed   By: [name] M.D.   On: 02/04/2020 19:44   US Abdomen Complete  Result Date: 02/04/2020 CLINICAL DATA:  Acute renal insufficiency, elevated liver enzymes, hypertension EXAM: ABDOMEN ULTRASOUND COMPLETE COMPARISON:  None. FINDINGS: Gallbladder: No gallstones or wall thickening visualized. No sonographic Murphy sign noted by sonographer. Common bile duct: Diameter: 3 mm Liver: Diffuse increased liver echotexture consistent with  steatosis. No focal abnormalities. Portal vein is patent on color Doppler imaging with normal direction of blood flow towards the liver. IVC: No abnormality visualized. Pancreas: No gross abnormalities.  Evaluation limited by bowel gas. Spleen: No evidence  of splenomegaly. Evaluation limited by body habitus and acoustic window. Right Kidney: Length: 13.0 cm. Echogenicity within normal limits. No mass or hydronephrosis visualized. Left Kidney: Length: 11.2 cm. Echogenicity within normal limits. No mass or hydronephrosis visualized. Abdominal aorta: No aneurysm visualized. Other findings: None. IMPRESSION: 1. Hepatic steatosis. 2. Otherwise unremarkable exam. Electronically Signed   By: Sharlet Salina M.D.   On: 02/04/2020 22:31   DG Chest Portable 1 View  Result Date: 02/04/2020 CLINICAL DATA:  Hypoxia. EXAM: PORTABLE CHEST 1 VIEW COMPARISON:  None. FINDINGS: The heart size and mediastinal contours are within normal limits. No pneumothorax or pleural effusion is noted. Mild bibasilar opacities are noted concerning for possible infiltrates, atelectasis or less likely edema. The visualized skeletal structures are unremarkable. IMPRESSION: Mild bibasilar opacities are noted concerning for possible infiltrates, atelectasis or less likely edema. Electronically Signed   By: [name] M.D.   On: 02/04/2020 18:47    Procedures Procedures (including critical care time)  CRITICAL CARE Performed by: Canary Brim Kaiel Weide Total critical care time: 45 minutes Critical care time was exclusive of separately billable procedures and treating other patients. Critical care was necessary to treat or prevent imminent or life-threatening deterioration. Critical care was time spent personally by me on the following activities: development of treatment plan with patient and/or surrogate as well as nursing, discussions with consultants, evaluation of patient's response to treatment, examination of patient, obtaining  history from patient or surrogate, ordering and performing treatments and interventions, ordering and review of laboratory studies, ordering and review of radiographic studies, pulse oximetry and re-evaluation of patient's condition.   Medications Ordered in ED Medications  vancomycin (VANCOREADY) IVPB 1750 mg/350 mL (1,750 mg Intravenous New Bag/Given 02/05/20 0000)  heparin injection 5,000 Units (5,000 Units Subcutaneous Given 02/04/20 2352)  cefTRIAXone (ROCEPHIN) 2 g in sodium chloride 0.9 % 100 mL IVPB (has no administration in time range)  azithromycin (ZITHROMAX) 500 mg in sodium chloride 0.9 % 250 mL IVPB (has no administration in time range)  0.9 %  sodium chloride infusion (has no administration in time range)  LORazepam (ATIVAN) tablet 1-4 mg (has no administration in time range)    Or  LORazepam (ATIVAN) injection 1-4 mg (has no administration in time range)  thiamine tablet 100 mg ( Oral See Alternative 02/04/20 2350)    Or  thiamine (B-1) injection 100 mg (100 mg Intravenous Given 02/04/20 2350)  folic acid (FOLVITE) tablet 1 mg (has no administration in time range)  multivitamin with minerals tablet 1 tablet (has no administration in time range)  vancomycin variable dose per unstable renal function (pharmacist dosing) (has no administration in time range)  sodium chloride 0.9 % bolus 1,000 mL (0 mLs Intravenous Stopped 02/04/20 2316)  cefTRIAXone (ROCEPHIN) 1 g in sodium chloride 0.9 % 100 mL IVPB (0 g Intravenous Stopped 02/04/20 2143)  azithromycin (ZITHROMAX) 500 mg in sodium chloride 0.9 % 250 mL IVPB (0 mg Intravenous Stopped 02/04/20 2156)  sodium chloride 0.9 % bolus 1,000 mL (0 mLs Intravenous Stopped 02/04/20 2143)  sodium chloride 0.9 % bolus 1,000 mL (1,000 mLs Intravenous New Bag/Given 02/04/20 2350)  sodium bicarbonate injection 50 mEq (50 mEq Intravenous Given 02/04/20 2353)    ED Course  I have reviewed the triage vital signs and the nursing notes.  Pertinent labs &  imaging results that were available during my care of the patient were reviewed by me and considered in my medical decision making (see chart for details).    MDM  Rules/Calculators/A&P                      Donald Carroll is a 54 y.o. male with a past medical history significant for depression, anxiety, hypertension, and prior alcohol abuse/heroin abuse who presents with altered mental status.  According to significant other, patient was altered yesterday afternoon around 1 PM and has been not acting the same since.  Patient had been having some twitching episodes and today was not responding to his family.  Patient brought in and was found to be altered, hypotensive, and hypoxic in the 60s.  There was question if the patient was having seizures with his twitching however I was quickly called into the room and when I examined patient, he was responding and speaking with voice and had a normal blink to threat.  Do not feel he is seizing at this time.  The twitching in his hands look somewhat like a ammonia asterixis, will add ammonia onto lab work.  Patient was on a nonrebreather and was having no complaints aside from feeling tired.  He denied any chest pain, palpitations, cough, fevers, chills, headache, neck pain, neck stiffness, shortness of breath.  Denies abdominal pain.  He does report he has not urinated today which is new for him.  A bladder scan was performed showing over 500 cc of urine so a Foley catheter was placed draining urine.  Patient has a history of drug use however he denies any drug use or alcohol use today.  We will send off labs to check this.  On my further exam, patient is moving all extremities and has normal sensation.  No facial droop.  Clear speech.  He is disoriented to time but is oriented to person and place.  He does not member what happened earlier today.  He denies other drug or medication use.  Patient will have broad work-up including imaging of his head and chest  x-ray and urinalysis to look for occult infection.  Given his lack of neck pain, neck stiffness, I have low suspicion for meningitis at this time.  We will give him fluids for the hypotension and will wean down his oxygen use.  Patient did not have pinpoint pupils on my exam however nursing was concerned he may have had pinpoint pupils on arrival.  If he develop pinpoint pupils or decreased breathing again, we will give Narcan with his history of heroin use.  Due to the altered mental status, hypoxia, and soft blood pressures, anticipate admission after work-up is completed.  Work-up returned showing evidence of pneumonia likely contributing to his hypoxia, tachycardia, and blood pressures.  Patient was afebrile but I am still concerned about infection.  Broad-spectrum antibiotics was initiated.  Labs also showed new acute kidney injury.  Neurology will see patient and he will be admitted in transfer to [facility] for further evaluation.  Patient will likely need either dialysis or CRRT based on his blood pressures.  They will see the patient and make further recommendations.  Patient admitted for further management.    Final Clinical Impression(s) / ED Diagnoses Final diagnoses:  Transaminitis  Acute kidney failure (HCC)  Sepsis (HCC)     Clinical Impression: 1. Transaminitis   2. Acute kidney failure (HCC)   3. Sepsis (HCC)     Disposition: Admit  This note was prepared with assistance of Dragon voice recognition software. Occasional wrong-word or sound-a-like substitutions may have occurred due to the inherent limitations of voice recognition  software.     Garrick Midgley, Canary Brim, MD 02/05/20 [phone]

## 2020-02-04 NOTE — ED Notes (Signed)
[facility] called for transport. 

## 2020-02-04 NOTE — ED Triage Notes (Signed)
Patient accompanied by significant other. SO states patient was "normal" yesterday but in the afternoon became confused. S.o. states patient then became very confused and passed out . Unable to wake patient. S.o states patient is having absence seizures but denies any history of seizures. Denies drug/alcohol use. Patient o2 say in triage 64%, bp low, 66/54, patient placed on non rebreather 15L, , Dr. Myrlene Broker at bedside

## 2020-02-04 NOTE — Progress Notes (Addendum)
Pharmacy Antibiotic Note  Donald Carroll is a 54 y.o. male admitted on 02/04/2020 with pneumonia.  PMH significant for bipolar disorder, depression, h/o alcohol/heroin abuse.  Pharmacy has been consulted for Vancomycin dosing.  SCr = 6.35  Plan:  Vancomycin 1750mg  IV x 1 dose  Due to poor renal function, will follow SCr trend and dose off of levels if needed  Ceftriaxone/Azithromycin per MD    Temp (24hrs), Avg:97.8 F (36.6 C), Min:97.8 F (36.6 C), Max:97.8 F (36.6 C)  Recent Labs  Lab 02/04/20 1813 02/04/20 2015  WBC 24.4*  --   CREATININE 6.35*  --   LATICACIDVEN 1.1 0.8    CrCl cannot be calculated (Unknown ideal weight.).    No Known Allergies  Antimicrobials this admission: 4/13 Azithromycin >> 4/13 Ceftriaxone >> 4/13 Vanc>>  Dose adjustments this admission:    Microbiology results: 4/13 BCx:  4/13 UCx:    4/13 Covid19: negative 4/13 Infl A: negative 4/13 Infl B: negative  Thank you for allowing pharmacy to be a part of this patient's care.  5/13, PharmD 02/04/2020 9:52 PM

## 2020-02-04 NOTE — Consult Note (Addendum)
[facility]  INPATIENT CONSULTATION  Reason for Consultation: AKI Requesting Provider: Dr. Rush Landmark  HPI: Donald Carroll is an 54 y.o. male with Bipolar DO, PTSD, anxiety, depression, HTN, h/o polysubstance use (heroin, ETOH) who is seen for evaluation and management of AKI.  Pt admitted for psychiatry last 07/2019 --appears substance use, SI at that time; Cr 0.9.   Presented to the ED today on a ~1 day history of AMS, twitching.  BPs 90/50s, afebrile, Hypoxic requiring NRB.  WBC 24, Na 126, K 5.4, BUN 91., Cr 6.35. CXR with possible bibasilar infiltrate. Influenza and COVID neg. Bladder scan with and foley inserted.  Abd [place] pending.  UA 1+ protein, small blood, 6-10 RBC/hpf, hyaline casts.  EtOH neg, salicylates neg, Utox neg. Head CT normal.  No family was available at the time of my exam and patient not responding to questions except to acknowledge difficulty with urination lately NOS.   PMH: Past Medical History:  Diagnosis Date  . Anxiety   . Depression   . Hypertension    PSH: No past surgical history on file.   Past Medical History:  Diagnosis Date  . Anxiety   . Depression   . Hypertension     Medications:  I have reviewed the patient's current medications.  (Not in a hospital admission)   ALLERGIES:  No Known Allergies  FAM HX: No family history on file.  Social History:   reports that he has been smoking cigarettes. He has been smoking about 1.00 pack per day. He has never used smokeless tobacco. He reports current alcohol use. He reports current drug use. Drug: Heroin.  ROS: unable to obtain from patient given AMS  Blood pressure (!) 95/58, pulse (!) 108, temperature 97.8 F (36.6 C), temperature source Rectal, resp. rate 14, SpO2 91 %. PHYSICAL EXAM: Gen: lying on stretcher with eyes closed on NRB  Eyes: anicteric, will look at me and EOM appear intact ENT: MMM Neck: supple, no JVD CV: tachycardic, no rub Abd: soft, subjective  TTP lower abdomen, no guarding or rebound Lungs: dec BS bases, normal WOB, no rales or wheezes GU: foley draining amber urine with sediment in the tubing Extr:  No edema Neuro: will follow commands to move extremties but does not speak to me, per ED was speaking earlier Skin: multiple tattoos, no rashes or bruising   Results for orders placed or performed during the hospital encounter of 02/04/20 (from the past 48 hour(s))  CBG monitoring, ED     Status: Abnormal   Collection Time: 02/04/20  5:41 PM  Result Value Ref Range   Glucose-Capillary 111 (H) 70 - 99 mg/dL    Comment: Glucose reference range applies only to samples taken after fasting for at least 8 hours.  Rapid urine drug screen (hospital performed)     Status: None   Collection Time: 02/04/20  5:58 PM  Result Value Ref Range   Opiates NONE DETECTED NONE DETECTED   Cocaine NONE DETECTED NONE DETECTED   Benzodiazepines NONE DETECTED NONE DETECTED   Amphetamines NONE DETECTED NONE DETECTED   Tetrahydrocannabinol NONE DETECTED NONE DETECTED   Barbiturates NONE DETECTED NONE DETECTED    Comment: (NOTE) DRUG SCREEN FOR MEDICAL PURPOSES ONLY.  IF CONFIRMATION IS NEEDED FOR ANY PURPOSE, NOTIFY LAB WITHIN 5 DAYS. LOWEST DETECTABLE LIMITS FOR URINE DRUG SCREEN Drug Class                     Cutoff (ng/mL) Amphetamine and  metabolites    1000 Barbiturate and metabolites    200 Benzodiazepine                 200 Tricyclics and metabolites     300 Opiates and metabolites        300 Cocaine and metabolites        300 THC                            50 Performed at [facility], [address], [place], [place] [address]   Urinalysis, Routine w reflex microscopic     Status: Abnormal   Collection Time: 02/04/20  5:59 PM  Result Value Ref Range   Color, Urine AMBER (A) YELLOW    Comment: BIOCHEMICALS MAY BE AFFECTED BY COLOR   APPearance HAZY (A) CLEAR   Specific Gravity, Urine 1.017 1.005 - 1.030   pH 5.0  5.0 - 8.0   Glucose, UA NEGATIVE NEGATIVE mg/dL   Hgb urine dipstick SMALL (A) NEGATIVE   Bilirubin Urine NEGATIVE NEGATIVE   Ketones, ur NEGATIVE NEGATIVE mg/dL   Protein, ur 30 (A) NEGATIVE mg/dL   Nitrite NEGATIVE NEGATIVE   Leukocytes,Ua NEGATIVE NEGATIVE   RBC / HPF 6-10 0 - 5 RBC/hpf   WBC, UA 0-5 0 - 5 WBC/hpf   Bacteria, UA RARE (A) NONE SEEN   Squamous Epithelial / LPF 0-5 0 - 5   Mucus PRESENT    Hyaline Casts, UA PRESENT     Comment: Performed at [facility], [address] [place], [place] [address]  CBC with Differential     Status: Abnormal   Collection Time: 02/04/20  6:13 PM  Result Value Ref Range   WBC 24.4 (H) 4.0 - 10.5 K/uL   RBC 4.41 4.22 - 5.81 MIL/uL   Hemoglobin 14.2 13.0 - 17.0 g/dL   HCT 53.6 64.4 - 03.4 %   MCV 95.5 80.0 - 100.0 fL   MCH 32.2 26.0 - 34.0 pg   MCHC 33.7 30.0 - 36.0 g/dL   RDW 74.2 59.5 - 63.8 %   Platelets 254 150 - 400 K/uL   nRBC 0.0 0.0 - 0.2 %   Neutrophils Relative % 78 %   Neutro Abs 22.0 (H) 1.7 - 7.7 K/uL   Band Neutrophils 12 %   Lymphocytes Relative 3 %   Lymphs Abs 0.7 0.7 - 4.0 K/uL   Monocytes Relative 5 %   Monocytes Absolute 1.2 (H) 0.1 - 1.0 K/uL   Eosinophils Relative 0 %   Eosinophils Absolute 0.0 0.0 - 0.5 K/uL   Basophils Relative 0 %   Basophils Absolute 0.0 0.0 - 0.1 K/uL   Metamyelocytes Relative 2 %   Abs Immature Granulocytes 0.50 (H) 0.00 - 0.07 K/uL   Dohle Bodies PRESENT     Comment: Performed at [facility], [address] [place], [place] [address]  Comprehensive metabolic panel     Status: Abnormal   Collection Time: 02/04/20  6:13 PM  Result Value Ref Range   Sodium 126 (L) 135 - 145 mmol/L   Potassium 5.4 (H) 3.5 - 5.1 mmol/L   Chloride 89 (L) 98 - 111 mmol/L   CO2 17 (L) 22 - 32 mmol/L   Glucose, Bld 106 (H) 70 - 99 mg/dL    Comment: Glucose reference range applies only to samples taken after fasting for at least 8 hours.   BUN 91 (  H) 6 - 20 mg/dL    Creatinine, Ser 6.35 (H) 0.61 - 1.24 mg/dL   Calcium 8.3 (L) 8.9 - 10.3 mg/dL   Total Protein 7.8 6.5 - 8.1 g/dL   Albumin 3.5 3.5 - 5.0 g/dL   AST 103 (H) 15 - 41 U/L   ALT 63 (H) 0 - 44 U/L   Alkaline Phosphatase 110 38 - 126 U/L   Total Bilirubin 1.1 0.3 - 1.2 mg/dL   GFR calc non Af Amer 9 (L) >60 mL/min   GFR calc Af Amer 11 (L) >60 mL/min   Anion gap 20 (H) 5 - 15    Comment: Performed at [facility], [place] [address] [place], [place] [address]  Lipase, blood     Status: None   Collection Time: 02/04/20  6:13 PM  Result Value Ref Range   Lipase 14 11 - 51 U/L    Comment: Performed at [place], [place] [address] [place], [place] [address]  Ammonia     Status: None   Collection Time: 02/04/20  6:13 PM  Result Value Ref Range   Ammonia 9 9 - 35 umol/L    Comment: Performed at [facility], [place] [address] [place], [place] [address]  Lactic acid, plasma     Status: None   Collection Time: 02/04/20  6:13 PM  Result Value Ref Range   Lactic Acid, Venous 1.1 0.5 - 1.9 mmol/L    Comment: Performed at [facility], [place] [address] [place], [place] [address]  Protime-INR     Status: None   Collection Time: 02/04/20  6:13 PM  Result Value Ref Range   Prothrombin Time 14.7 11.4 - 15.2 seconds   INR 1.2 0.8 - 1.2    Comment: (NOTE) INR goal varies based on device and disease states. Performed at [facility], [place] [address], [place], [place] [address]   Respiratory Panel by RT PCR (Flu A&B, Covid) - Nasopharyngeal Swab     Status: None   Collection Time: 02/04/20  6:13 PM   Specimen: Nasopharyngeal Swab  Result Value Ref Range   SARS Coronavirus 2 by RT PCR NEGATIVE NEGATIVE    Comment: (NOTE) SARS-CoV-2 target nucleic acids are NOT DETECTED. The SARS-CoV-2 RNA is generally detectable in upper respiratoy specimens during the acute phase of infection. The lowest concentration of  SARS-CoV-2 viral copies this assay can detect is 131 copies/mL. A negative result does not preclude SARS-Cov-2 infection and should not be used as the sole basis for treatment or other patient management decisions. A negative result may occur with  improper specimen collection/handling, submission of specimen other than nasopharyngeal swab, presence of viral mutation(s) within the areas targeted by this assay, and inadequate number of viral copies (<131 copies/mL). A negative result must be combined with clinical observations, patient history, and epidemiological information. The expected result is Negative. Fact Sheet for Patients:  PinkCheek.be Fact Sheet for Healthcare Providers:  GravelBags.it This test is not yet ap proved or cleared by the [place] FDA and  has been authorized for detection and/or diagnosis of SARS-CoV-2 by FDA under an Emergency Use Authorization (EUA). This EUA will remain  in effect (meaning this test can be used) for the duration of the COVID-19 declaration under Section 564(b)(1) of the Act, 21 U.S.C. section 360bbb-3(b)(1), unless the authorization is terminated or revoked sooner.    Influenza A by PCR NEGATIVE NEGATIVE   Influenza B by PCR  NEGATIVE NEGATIVE    Comment: (NOTE) The Xpert Xpress SARS-CoV-2/FLU/RSV assay is intended as an aid in  the diagnosis of influenza from Nasopharyngeal swab specimens and  should not be used as a sole basis for treatment. Nasal washings and  aspirates are unacceptable for Xpert Xpress SARS-CoV-2/FLU/RSV  testing. Fact Sheet for Patients: https://www.moore.com/ Fact Sheet for Healthcare Providers: https://www.young.biz/ This test is not yet approved or cleared by the [place] FDA and  has been authorized for detection and/or diagnosis of SARS-CoV-2 by  FDA under an Emergency Use Authorization (EUA). This EUA will  remain  in effect (meaning this test can be used) for the duration of the  Covid-19 declaration under Section 564(b)(1) of the Act, 21  U.S.C. section 360bbb-3(b)(1), unless the authorization is  terminated or revoked. Performed at [facility], [address] [place], [place] [address]   Troponin I (High Sensitivity)     Status: None   Collection Time: 02/04/20  6:13 PM  Result Value Ref Range   Troponin I (High Sensitivity) 7 <18 ng/L    Comment: (NOTE) Elevated high sensitivity troponin I (hsTnI) values and significant  changes across serial measurements may suggest ACS but many other  chronic and acute conditions are known to elevate hsTnI results.  Refer to the "Links" section for chest pain algorithms and additional  guidance. Performed at [facility], [address] [place], [place] [address]   Ethanol     Status: None   Collection Time: 02/04/20  6:13 PM  Result Value Ref Range   Alcohol, Ethyl (B) <10 <10 mg/dL    Comment: (NOTE) Lowest detectable limit for serum alcohol is 10 mg/dL. For medical purposes only. Performed at [facility], [address], [place], [place] [address]   Salicylate level     Status: Abnormal   Collection Time: 02/04/20  6:13 PM  Result Value Ref Range   Salicylate Lvl <7.0 (L) 7.0 - 30.0 mg/dL    Comment: Performed at [facility], [address] [place], [place] [address]  Acetaminophen level     Status: Abnormal   Collection Time: 02/04/20  6:13 PM  Result Value Ref Range   Acetaminophen (Tylenol), Serum <10 (L) 10 - 30 ug/mL    Comment: (NOTE) Therapeutic concentrations vary significantly. A range of 10-30 ug/mL  may be an effective concentration for many patients. However, some  are best treated at concentrations outside of this range. Acetaminophen concentrations >150 ug/mL at 4 hours after ingestion  and >50 ug/mL at 12 hours after ingestion are often  associated with  toxic reactions. Performed at [facility], [address] [place], [place] [address]   Troponin I (High Sensitivity)     Status: None   Collection Time: 02/04/20  8:14 PM  Result Value Ref Range   Troponin I (High Sensitivity) 7 <18 ng/L    Comment: (NOTE) Elevated high sensitivity troponin I (hsTnI) values and significant  changes across serial measurements may suggest ACS but many other  chronic and acute conditions are known to elevate hsTnI results.  Refer to the "Links" section for chest pain algorithms and additional  guidance. Performed at [facility], [address] [place], [place] [address]   Lactic acid, plasma     Status: None   Collection Time: 02/04/20  8:15 PM  Result Value Ref Range   Lactic Acid, Venous 0.8 0.5 - 1.9 mmol/L    Comment:  Performed at [facility], [address] [place], [place] [id number]    CT Head Wo Contrast  Result Date: 02/04/2020 CLINICAL DATA:  Encephalopathy EXAM: CT HEAD WITHOUT CONTRAST TECHNIQUE: Contiguous axial images were obtained from the base of the skull through the vertex without intravenous contrast. COMPARISON:  None. FINDINGS: Brain: There is no mass, hemorrhage or extra-axial collection. The size and configuration of the ventricles and extra-axial CSF spaces are normal. The brain parenchyma is normal, without acute or chronic infarction. Vascular: No abnormal hyperdensity of the major intracranial arteries or dural venous sinuses. No intracranial atherosclerosis. Skull: The visualized skull base, calvarium and extracranial soft tissues are normal. Sinuses/Orbits: No fluid levels or advanced mucosal thickening of the visualized paranasal sinuses. No mastoid or middle ear effusion. The orbits are normal. IMPRESSION: Normal head CT. Electronically Signed   By: Deatra RobinsonKevin  Herman M.D.   On: 02/04/2020 19:44   DG Chest Portable 1 View  Result Date:  02/04/2020 CLINICAL DATA:  Hypoxia. EXAM: PORTABLE CHEST 1 VIEW COMPARISON:  None. FINDINGS: The heart size and mediastinal contours are within normal limits. No pneumothorax or pleural effusion is noted. Mild bibasilar opacities are noted concerning for possible infiltrates, atelectasis or less likely edema. The visualized skeletal structures are unremarkable. IMPRESSION: Mild bibasilar opacities are noted concerning for possible infiltrates, atelectasis or less likely edema. Electronically Signed   By: Lupita RaiderJames  Green Jr M.D.   On: 02/04/2020 18:47    Assessment/Plan **Sepsis:  Cultures and broad spectrum abx per primary - appears likely PNA with resultant hypoxia  **AKI: presumably AKI with normal renal function 6 months ago.  Per chart was having urinary retention on presentation and foley now in place, maintain for now.  Abd US pending eval for hydronephrosis.  Moderate hypotension on presentation, may have some ATN in the coming days. Agree with fluid challenge.  Check FeNa, CK.  It would be unusual for such severe AMS to be the result of a BUN < 100 and I suspect the AMS is multifactorial.  No current indications for dialysis but may develop in the coming days.  **AGMA: most likely related to AKI; in setting of AMS check ethylene glycol for completeness; no oxalate crystals noted in urine.  Supplement bicarb with 1 amp now, switch to po when able.  **AMS: unclear precipitant, CT head normal, no e/o ingestion so far.  Per above doubt solely related to AKI.  **HTN: hold meds.  **Hyponatremia: Serum na 126, follow s/p hydration. Further w/u if not improving.  **Hyperkalemia:  Follow after hydration; currently NPO and no NG tube for meds. May need NG for lokelma if rising.  **h/o Bipolar, anxiety, depression, PTSD, h/o SI/attempts  WIll follow, page with issues.  Tyler PitaLindsay A Marquie Aderhold 02/04/2020, 10:29 PM

## 2020-02-04 NOTE — H&P (Signed)
History and Physical    Donald Carroll WIO:[id number] DOB: 01/07/1966 DOA: 02/04/2020  PCP: Patient, No Pcp Per  Patient coming from: Home  I have personally briefly reviewed patient's old medical records in [facility]  Chief Complaint: AMS  HPI: Donald Carroll is a 54 y.o. male with medical history significant of EtOH / substance abuse, HTN, BPD, PTSD.  SI in Oct.  Pt in to ED with 1 day h/o AMS, twitching per family.  No twiching or other seizure like activity in ED, is able to say a few words but not give a great history.   ED Course: Found to have initial BPs in the 60s systolic, improves to 90s/50s after 2L NS and 100 systolic after 3L NS.  Hypoxic requiring NRB initially.  WBC 24k  COVID and flu neg.  NA 126, K 5.4, BUN 91, Creat 6.35 up from 0.9 just a few months ago.  UDS neg.  EtOH and salicylates neg.  Given empiric rocephin / azithro.  CXR ? BLL infiltrates.  Foley placed with good UOP thus far.   Review of Systems: Unable to perform due to AMS.  Past Medical History:  Diagnosis Date  . Anxiety   . Depression   . Hypertension     No past surgical history on file.   reports that he has been smoking cigarettes. He has been smoking about 1.00 pack per day. He has never used smokeless tobacco. He reports current alcohol use. He reports current drug use. Drug: Heroin.  No Known Allergies  No family history on file. Unable to obtain due to AMS.  Prior to Admission medications   Medication Sig Start Date End Date Taking? Authorizing Provider  acamprosate (CAMPRAL) 333 MG tablet Take 2 tablets (666 mg total) by mouth 3 (three) times daily. 08/11/19   Aldean Baker, NP  amLODipine (NORVASC) 5 MG tablet Take 1 tablet (5 mg total) by mouth daily. 08/12/19   Aldean Baker, NP  DULoxetine (CYMBALTA) 60 MG capsule Take 1 capsule (60 mg total) by mouth daily. 08/12/19   Aldean Baker, NP  gabapentin (NEURONTIN) 400 MG capsule Take 1 capsule (400 mg  total) by mouth 3 (three) times daily. 08/11/19   Aldean Baker, NP  lisinopril (ZESTRIL) 20 MG tablet Take 1 tablet (20 mg total) by mouth daily. 08/12/19   Aldean Baker, NP  Multiple Vitamin (MULTIVITAMIN WITH MINERALS) TABS tablet Take 1 tablet by mouth daily. 08/12/19   Aldean Baker, NP  nicotine (NICODERM CQ - DOSED IN MG/24 HOURS) 21 mg/24hr patch Place 1 patch (21 mg total) onto the skin daily. 08/12/19   Aldean Baker, NP  OLANZapine (ZYPREXA) 10 MG tablet Take 1 tablet (10 mg total) by mouth at bedtime. 08/11/19   Aldean Baker, NP  Oxcarbazepine (TRILEPTAL) 300 MG tablet Take 1 tablet (300 mg total) by mouth 2 (two) times daily. 08/11/19   Aldean Baker, NP    Physical Exam: Vitals:   02/04/20 2040 02/04/20 2045 02/04/20 2115 02/04/20 2130  BP: (!) 91/56 (!) 92/57 97/61 (!) 95/58  Pulse: (!) 104 (!) 102 (!) 106 (!) 108  Resp: 13 12 15 14   Temp:      TempSrc:      SpO2: 93% 94% 90% 91%    Constitutional: NAD, calm, comfortable Eyes: PERRL, lids and conjunctivae normal ENMT: Mucous membranes are moist. Posterior pharynx clear of any exudate or lesions.Normal dentition.  Neck: normal, supple, no  masses, no thyromegaly Respiratory: clear to auscultation bilaterally, no wheezing, no crackles. Normal respiratory effort. No accessory muscle use.  Cardiovascular: Regular rate and rhythm, no murmurs / rubs / gallops. No extremity edema. 2+ pedal pulses. No carotid bruits.  Abdomen: no tenderness, no masses palpated. No hepatosplenomegaly. Bowel sounds positive.  Musculoskeletal: no clubbing / cyanosis. No joint deformity upper and lower extremities. Good ROM, no contractures. Normal muscle tone.  Skin: No rashes, couple of tatoos on arms, no foot ulcers, no obvious knee swelling or erythema. Neurologic: MAE Psychiatric: able to wake up and say a few words, not really oriented though.    Labs on Admission: I have personally reviewed following labs and imaging  studies  CBC: Recent Labs  Lab 02/04/20 1813  WBC 24.4*  NEUTROABS 22.0*  HGB 14.2  HCT 42.1  MCV 95.5  PLT 254   Basic Metabolic Panel: Recent Labs  Lab 02/04/20 1813  NA 126*  K 5.4*  CL 89*  CO2 17*  GLUCOSE 106*  BUN 91*  CREATININE 6.35*  CALCIUM 8.3*   GFR: CrCl cannot be calculated (Unknown ideal weight.). Liver Function Tests: Recent Labs  Lab 02/04/20 1813  AST 103*  ALT 63*  ALKPHOS 110  BILITOT 1.1  PROT 7.8  ALBUMIN 3.5   Recent Labs  Lab 02/04/20 1813  LIPASE 14   Recent Labs  Lab 02/04/20 1813  AMMONIA 9   Coagulation Profile: Recent Labs  Lab 02/04/20 1813  INR 1.2   Cardiac Enzymes: No results for input(s): CKTOTAL, CKMB, CKMBINDEX, TROPONINI in the last 168 hours. BNP (last 3 results) No results for input(s): PROBNP in the last 8760 hours. HbA1C: No results for input(s): HGBA1C in the last 72 hours. CBG: Recent Labs  Lab 02/04/20 1741  GLUCAP 111*   Lipid Profile: No results for input(s): CHOL, HDL, LDLCALC, TRIG, CHOLHDL, LDLDIRECT in the last 72 hours. Thyroid Function Tests: No results for input(s): TSH, T4TOTAL, FREET4, T3FREE, THYROIDAB in the last 72 hours. Anemia Panel: No results for input(s): VITAMINB12, FOLATE, FERRITIN, TIBC, IRON, RETICCTPCT in the last 72 hours. Urine analysis:    Component Value Date/Time   COLORURINE AMBER (A) 02/04/2020 1759   APPEARANCEUR HAZY (A) 02/04/2020 1759   LABSPEC 1.017 02/04/2020 1759   PHURINE 5.0 02/04/2020 1759   GLUCOSEU NEGATIVE 02/04/2020 1759   HGBUR SMALL (A) 02/04/2020 1759   BILIRUBINUR NEGATIVE 02/04/2020 1759   KETONESUR NEGATIVE 02/04/2020 1759   PROTEINUR 30 (A) 02/04/2020 1759   NITRITE NEGATIVE 02/04/2020 1759   LEUKOCYTESUR NEGATIVE 02/04/2020 1759    Radiological Exams on Admission: CT Head Wo Contrast  Result Date: 02/04/2020 CLINICAL DATA:  Encephalopathy EXAM: CT HEAD WITHOUT CONTRAST TECHNIQUE: Contiguous axial images were obtained from the  base of the skull through the vertex without intravenous contrast. COMPARISON:  None. FINDINGS: Brain: There is no mass, hemorrhage or extra-axial collection. The size and configuration of the ventricles and extra-axial CSF spaces are normal. The brain parenchyma is normal, without acute or chronic infarction. Vascular: No abnormal hyperdensity of the major intracranial arteries or dural venous sinuses. No intracranial atherosclerosis. Skull: The visualized skull base, calvarium and extracranial soft tissues are normal. Sinuses/Orbits: No fluid levels or advanced mucosal thickening of the visualized paranasal sinuses. No mastoid or middle ear effusion. The orbits are normal. IMPRESSION: Normal head CT. Electronically Signed   By: Deatra Robinson M.D.   On: 02/04/2020 19:44   US Abdomen Complete  Result Date: 02/04/2020 CLINICAL DATA:  Acute renal  insufficiency, elevated liver enzymes, hypertension EXAM: ABDOMEN ULTRASOUND COMPLETE COMPARISON:  None. FINDINGS: Gallbladder: No gallstones or wall thickening visualized. No sonographic Murphy sign noted by sonographer. Common bile duct: Diameter: 3 mm Liver: Diffuse increased liver echotexture consistent with steatosis. No focal abnormalities. Portal vein is patent on color Doppler imaging with normal direction of blood flow towards the liver. IVC: No abnormality visualized. Pancreas: No gross abnormalities.  Evaluation limited by bowel gas. Spleen: No evidence of splenomegaly. Evaluation limited by body habitus and acoustic window. Right Kidney: Length: 13.0 cm. Echogenicity within normal limits. No mass or hydronephrosis visualized. Left Kidney: Length: 11.2 cm. Echogenicity within normal limits. No mass or hydronephrosis visualized. Abdominal aorta: No aneurysm visualized. Other findings: None. IMPRESSION: 1. Hepatic steatosis. 2. Otherwise unremarkable exam. Electronically Signed   By: Sharlet Salina M.D.   On: 02/04/2020 22:31   DG Chest Portable 1  View  Result Date: 02/04/2020 CLINICAL DATA:  Hypoxia. EXAM: PORTABLE CHEST 1 VIEW COMPARISON:  None. FINDINGS: The heart size and mediastinal contours are within normal limits. No pneumothorax or pleural effusion is noted. Mild bibasilar opacities are noted concerning for possible infiltrates, atelectasis or less likely edema. The visualized skeletal structures are unremarkable. IMPRESSION: Mild bibasilar opacities are noted concerning for possible infiltrates, atelectasis or less likely edema. Electronically Signed   By: [name] M.D.   On: 02/04/2020 18:47    EKG: Independently reviewed.  Assessment/Plan Principal Problem:   Severe sepsis with acute organ dysfunction (HCC) Active Problems:   Alcohol dependence with alcohol-induced mood disorder (HCC)   Acute respiratory failure with hypoxia (HCC)   Acute kidney failure (HCC)   Sepsis associated hypotension (HCC)   Acute metabolic encephalopathy    1. Severe sepsis with MODS - and associated hypotension 1. Hypotension, elevated WBC, new O2 requirement. 2. Suspect underlying sepsis, probably from a PNA given the O2 requirement. 3. Good response to IVF 1. PE less likely given BP response to IVF 2. Cardiogenic shock also less likely given CXR findings (or lack there of) and response to IVF and neg trops. 4. CAP pathway 5. Empiric rocephin, azithro, vanc 6. procalcitonin pending 7. BNP pending 8. Tele monitor and cont pulse ox 9. IVF: 3L bolus thus far and NS at 125 cc/hr currently 2. AKF - 1. Likely due to #1 above 2. Nephro consulting 3. Strict intake and output 4. Foley in place and draining 3. Acute resp failure with hypoxia - 1. Due to #1 above most likely 2. Would expect a more impressive CXR if pulm edema were causing this degree of O2 requirement. 3. Wouldn't expect the amount of improvement we see with IVF if it were PE causing this (also wouldn't expect the WBC elevation so much). 4. Acute encephalopathy  - 1. Likely delirium due to sepsis 2. No EtOH on board, and UDS neg. 3. Able to wake up and say a few words, dont want to give narcan right now. 4. CT head without acute finding. 5. H/o EtOH abuse - 1. CIWA  DVT prophylaxis: Lovenox Code Status: Full Family Communication: No family in room Disposition Plan: TBD Consults called: Nephrology Admission status: Admit to inpatient  Severity of Illness: The appropriate patient status for this patient is INPATIENT. Inpatient status is judged to be reasonable and necessary in order to provide the required intensity of service to ensure the patient's safety. The patient's presenting symptoms, physical exam findings, and initial radiographic and laboratory data in the context of their chronic comorbidities is  felt to place them at high risk for further clinical deterioration. Furthermore, it is not anticipated that the patient will be medically stable for discharge from the hospital within 2 midnights of admission. The following factors support the patient status of inpatient.   IP status due to hypotension with MODS, presumably sepsis from a PNA given the new O2 requirement. 1) new O2 requirement 2) AKF with creat 6.x up from 0.9 baseline 3) Acute encephalopathy   * I certify that at the point of admission it is my clinical judgment that the patient will require inpatient hospital care spanning beyond 2 midnights from the point of admission due to high intensity of service, high risk for further deterioration and high frequency of surveillance required.*    Cherell Colvin M. DO [facility]  How to contact the [facility] Attending or Consulting provider Icard or covering provider during after hours [place], for this patient?  1. Check the care team in [facility] and look for a) attending/consulting TR[facility] provider listed and b) the [facility] team listed 2. Log into [email or web address]  Amion Physician Scheduling and messaging for groups and whole hospitals  On  call and physician scheduling software for group practices, residents, hospitalists and other medical providers for call, clinic, rotation and shift schedules. OnCall Enterprise is a hospital-wide system for scheduling doctors and paging doctors on call. EasyPlot is for scientific plotting and data analysis.  [email or web address]  and use [facility]'s universal password to access. If you do not have the password, please contact the hospital operator.  3. Locate the [facility] provider you are looking for under [facility] and page to a number that you can be directly reached. 4. If you still have difficulty reaching the provider, please page the [facility] (Director on Call) for the Hospitalists listed on amion for assistance.  02/04/2020, 10:50 PM

## 2020-02-05 LAB — BASIC METABOLIC PANEL
Anion gap: 14 (ref 5–15)
BUN: 70 mg/dL — ABNORMAL HIGH (ref 6–20)
CO2: 21 mmol/L — ABNORMAL LOW (ref 22–32)
Calcium: 8 mg/dL — ABNORMAL LOW (ref 8.9–10.3)
Chloride: 99 mmol/L (ref 98–111)
Creatinine, Ser: 2.78 mg/dL — ABNORMAL HIGH (ref 0.61–1.24)
GFR calc Af Amer: 29 mL/min — ABNORMAL LOW (ref >60)
GFR calc non Af Amer: 25 mL/min — ABNORMAL LOW (ref >60)
Glucose, Bld: 91 mg/dL (ref 70–99)
Potassium: 4.5 mmol/L (ref 3.5–5.1)
Sodium: 134 mmol/L — ABNORMAL LOW (ref 135–145)

## 2020-02-05 LAB — COMPREHENSIVE METABOLIC PANEL
ALT: 54 U/L — ABNORMAL HIGH (ref 0–44)
AST: 75 U/L — ABNORMAL HIGH (ref 15–41)
Albumin: 2.4 g/dL — ABNORMAL LOW (ref 3.5–5.0)
Alkaline Phosphatase: 94 U/L (ref 38–126)
Anion gap: 16 — ABNORMAL HIGH (ref 5–15)
BUN: 84 mg/dL — ABNORMAL HIGH (ref 6–20)
CO2: 18 mmol/L — ABNORMAL LOW (ref 22–32)
Calcium: 7.6 mg/dL — ABNORMAL LOW (ref 8.9–10.3)
Chloride: 98 mmol/L (ref 98–111)
Creatinine, Ser: 4.19 mg/dL — ABNORMAL HIGH (ref 0.61–1.24)
GFR calc Af Amer: 18 mL/min — ABNORMAL LOW (ref >60)
GFR calc non Af Amer: 15 mL/min — ABNORMAL LOW (ref >60)
Glucose, Bld: 89 mg/dL (ref 70–99)
Potassium: 4.9 mmol/L (ref 3.5–5.1)
Sodium: 132 mmol/L — ABNORMAL LOW (ref 135–145)
Total Bilirubin: 1.1 mg/dL (ref 0.3–1.2)
Total Protein: 6 g/dL — ABNORMAL LOW (ref 6.5–8.1)

## 2020-02-05 LAB — PHOSPHORUS: Phosphorus: 6.7 mg/dL — ABNORMAL HIGH (ref 2.5–4.6)

## 2020-02-05 LAB — BLOOD GAS, ARTERIAL
Acid-base deficit: 9.7 mmol/L — ABNORMAL HIGH (ref 0.0–2.0)
Acid-base deficit: 7.9 mmol/L — ABNORMAL HIGH (ref 0.0–2.0)
Bicarbonate: 17.2 mmol/L — ABNORMAL LOW (ref 20.0–28.0)
Bicarbonate: 18.7 mmol/L — ABNORMAL LOW (ref 20.0–28.0)
FIO2: 100
FIO2: 100
O2 Saturation: 94 %
O2 Saturation: 94 %
Patient temperature: 36.9
Patient temperature: 36.9
pCO2 arterial: 48 mmHg (ref 32.0–48.0)
pCO2 arterial: 49.5 mmHg — ABNORMAL HIGH (ref 32.0–48.0)
pH, Arterial: 7.18 — CL (ref 7.350–7.450)
pH, Arterial: 7.202 — ABNORMAL LOW (ref 7.350–7.450)
pO2, Arterial: 78.8 mmHg — ABNORMAL LOW (ref 83.0–108.0)
pO2, Arterial: 76.5 mmHg — ABNORMAL LOW (ref 83.0–108.0)

## 2020-02-05 LAB — HIV ANTIBODY (ROUTINE TESTING W REFLEX): HIV Screen 4th Generation wRfx: NONREACTIVE

## 2020-02-05 LAB — CBC
HCT: 37.7 % — ABNORMAL LOW (ref 39.0–52.0)
Hemoglobin: 12.7 g/dL — ABNORMAL LOW (ref 13.0–17.0)
MCH: 32 pg (ref 26.0–34.0)
MCHC: 33.7 g/dL (ref 30.0–36.0)
MCV: 95 fL (ref 80.0–100.0)
Platelets: 229 10*3/uL (ref 150–400)
RBC: 3.97 MIL/uL — ABNORMAL LOW (ref 4.22–5.81)
RDW: 14.1 % (ref 11.5–15.5)
WBC: 17.1 10*3/uL — ABNORMAL HIGH (ref 4.0–10.5)
nRBC: 0 % (ref 0.0–0.2)

## 2020-02-05 LAB — CREATININE, URINE, RANDOM: Creatinine, Urine: 224.66 mg/dL

## 2020-02-05 LAB — SODIUM, URINE, RANDOM: Sodium, Ur: <10 mmol/L

## 2020-02-05 LAB — STREP PNEUMONIAE URINARY ANTIGEN: Strep Pneumo Urinary Antigen: NEGATIVE

## 2020-02-05 LAB — BRAIN NATRIURETIC PEPTIDE: B Natriuretic Peptide: 252.7 pg/mL — ABNORMAL HIGH (ref 0.0–100.0)

## 2020-02-05 LAB — MRSA PCR SCREENING: MRSA by PCR: NEGATIVE

## 2020-02-05 LAB — PROCALCITONIN: Procalcitonin: 28.77 ng/mL

## 2020-02-05 LAB — MAGNESIUM: Magnesium: 2.7 mg/dL — ABNORMAL HIGH (ref 1.7–2.4)

## 2020-02-05 LAB — ETHYLENE GLYCOL: Ethylene Glycol Lvl: <5 mg/dL

## 2020-02-05 LAB — GLUCOSE, CAPILLARY: Glucose-Capillary: 77 mg/dL (ref 70–99)

## 2020-02-05 LAB — CK: Total CK: 1445 U/L — ABNORMAL HIGH (ref 49–397)

## 2020-02-05 MED ORDER — STERILE WATER FOR INJECTION IV SOLN
INTRAVENOUS | Status: AC
  Filled 2020-02-05 (×2): qty 850

## 2020-02-05 MED ORDER — STERILE WATER FOR INJECTION IV SOLN
INTRAVENOUS | Status: DC
  Filled 2020-02-05 (×2): qty 850

## 2020-02-05 MED ORDER — CHLORHEXIDINE GLUCONATE CLOTH 2 % EX PADS
6.0000 | MEDICATED_PAD | Freq: Every day | CUTANEOUS | Status: DC
  Administered 2020-02-05: 6 via TOPICAL

## 2020-02-05 NOTE — Progress Notes (Signed)
Patient ID: Donald Carroll, male   DOB: [birth year], 54 y.o.   MRN: [id number] [facility] Progress Note   Assessment/ Plan:   1. Acute kidney Injury: With normal renal function at baseline.  Overnight with significant improvement of renal function following placement of Foley catheter and fluid challenge (urine electrolytes indicative of significant intravascular volume contraction).  Renal/abdominal ultrasound negative for hydronephrosis.  Recommend continued intravenous fluids at this time until mental status improved back to baseline where he can reliably drink by mouth.  Hyperkalemia corrected with forced diuresis/management of metabolic acidosis. 2.  Hyponatremia: Improving with isotonic fluids/improving renal function.  Likely hypovolemic hyponatremia that is improving with fluids/urine output. 3.  Anion gap metabolic acidosis: Secondary to acute kidney injury, improving on isotonic sodium bicarbonate. 4.  Altered mental status: With underlying history of mental illness, will continue to follow with ongoing intravenous fluids.  Subjective:   Without acute events overnight.   Objective:   BP 110/69 (BP Location: Right Arm)   Pulse (!) 107   Temp 98 F (36.7 C) (Oral)   Resp 18   Wt 103.6 kg   SpO2 90%   BMI 31.85 kg/m   Intake/Output Summary (Last 24 hours) at 02/05/2020 0837 Last data filed at 02/05/2020 0600 Gross per 24 hour  Intake 2703.81 ml  Output 1925 ml  Net 778.81 ml   Weight change:   Physical Exam: Gen: Awake and appears to be somewhat uncomfortable resting in bed, on NRB.  No verbal responses. CVS: Pulse regular tachycardia, S1 and S2 with ejection systolic murmur Resp: Clear to auscultation, no rales/rhonchi Abd: Soft, obese, nontender Ext: No lower extremity edema.  Tattoos over upper extremities noted.  Imaging: CT Head Wo Contrast  Result Date: 02/04/2020 CLINICAL DATA:  Encephalopathy EXAM: CT HEAD WITHOUT CONTRAST TECHNIQUE: Contiguous  axial images were obtained from the base of the skull through the vertex without intravenous contrast. COMPARISON:  None. FINDINGS: Brain: There is no mass, hemorrhage or extra-axial collection. The size and configuration of the ventricles and extra-axial CSF spaces are normal. The brain parenchyma is normal, without acute or chronic infarction. Vascular: No abnormal hyperdensity of the major intracranial arteries or dural venous sinuses. No intracranial atherosclerosis. Skull: The visualized skull base, calvarium and extracranial soft tissues are normal. Sinuses/Orbits: No fluid levels or advanced mucosal thickening of the visualized paranasal sinuses. No mastoid or middle ear effusion. The orbits are normal. IMPRESSION: Normal head CT. Electronically Signed   By: [name] M.D.   On: 02/04/2020 19:44   US Abdomen Complete  Result Date: 02/04/2020 CLINICAL DATA:  Acute renal insufficiency, elevated liver enzymes, hypertension EXAM: ABDOMEN ULTRASOUND COMPLETE COMPARISON:  None. FINDINGS: Gallbladder: No gallstones or wall thickening visualized. No sonographic Murphy sign noted by sonographer. Common bile duct: Diameter: 3 mm Liver: Diffuse increased liver echotexture consistent with steatosis. No focal abnormalities. Portal vein is patent on color Doppler imaging with normal direction of blood flow towards the liver. IVC: No abnormality visualized. Pancreas: No gross abnormalities.  Evaluation limited by bowel gas. Spleen: No evidence of splenomegaly. Evaluation limited by body habitus and acoustic window. Right Kidney: Length: 13.0 cm. Echogenicity within normal limits. No mass or hydronephrosis visualized. Left Kidney: Length: 11.2 cm. Echogenicity within normal limits. No mass or hydronephrosis visualized. Abdominal aorta: No aneurysm visualized. Other findings: None. IMPRESSION: 1. Hepatic steatosis. 2. Otherwise unremarkable exam. Electronically Signed   By: [name] M.D.   On: 02/04/2020 22:31    DG Chest Portable  1 View  Result Date: 02/04/2020 CLINICAL DATA:  Hypoxia. EXAM: PORTABLE CHEST 1 VIEW COMPARISON:  None. FINDINGS: The heart size and mediastinal contours are within normal limits. No pneumothorax or pleural effusion is noted. Mild bibasilar opacities are noted concerning for possible infiltrates, atelectasis or less likely edema. The visualized skeletal structures are unremarkable. IMPRESSION: Mild bibasilar opacities are noted concerning for possible infiltrates, atelectasis or less likely edema. Electronically Signed   By: [name] M.D.   On: 02/04/2020 18:47    Labs: BMET Recent Labs  Lab 02/04/20 1813 02/05/20 0003 02/05/20 0544  NA 126*  --  132*  K 5.4*  --  4.9  CL 89*  --  98  CO2 17*  --  18*  GLUCOSE 106*  --  89  BUN 91*  --  84*  CREATININE 6.35*  --  4.19*  CALCIUM 8.3*  --  7.6*  PHOS  --  6.7*  --    CBC Recent Labs  Lab 02/04/20 1813 02/05/20 0544  WBC 24.4* 17.1*  NEUTROABS 22.0*  --   HGB 14.2 12.7*  HCT 42.1 37.7*  MCV 95.5 95.0  PLT 254 229    Medications:    . Chlorhexidine Gluconate Cloth  6 each Topical Daily  . folic acid  1 mg Oral Daily  . heparin  5,000 Units Subcutaneous Q8H  . multivitamin with minerals  1 tablet Oral Daily  . thiamine  100 mg Oral Daily   Or  . thiamine  100 mg Intravenous Daily  . vancomycin variable dose per unstable renal function (pharmacist dosing)   Does not apply See admin instructions   Zetta Bills, MD 02/05/2020, 8:37 AM

## 2020-02-05 NOTE — ED Notes (Signed)
[facility] left to transport patient

## 2020-02-05 NOTE — Progress Notes (Signed)
Patient is asleep and snoring. BBS are clear with exception of snoring sound through out lungs. Patient able to be aroused but quickly falls back asleep with eyes open. He had a rattled cough and was able to cough up secretions and swallowed them. An abg was drawn on 15 L NRB and we are awaiting the results. Will continue to monitor and follow up as needed.

## 2020-02-05 NOTE — Progress Notes (Signed)
Attending MD paged in regards to non-rebreather: " would a BPAP be more appropiate? patient continues to take non-rebreather off, sats drop to 82%" MD paged back with order to continue non-rebreather.  Will continue to monitor oxygen saturation.

## 2020-02-05 NOTE — Progress Notes (Signed)
PROGRESS NOTE  Donald Carroll [id number]:[id number] DOB: 1966/03/14 DOA: 02/04/2020 PCP: Patient, No Pcp Per   LOS: 1 day   Brief Narrative / Interim history: 54 year old male with history of EtOH/substance abuse, HTN, PTSD, suicidal ideation in October, came in with altered mental status and increased twitching.  He was found to be significantly hypotensive with blood pressure in the 60s, requiring aggressive fluids.  White count was 24,000 and he was found to have acute kidney injury.  Subjective / 24h Interval events: He is alert but confused, mumbles but speech is not coherent.  Assessment & Plan: Principal Problem Severe sepsis with MOD S and associated hypotension -Suspicion high for underlying sepsis, possibly from pneumonia given hypoxia, chest x-ray with mild bibasilar infiltrates.  He was started on ceftriaxone and azithromycin, continue -WBC improving  Active Problems Acute kidney injury with metabolic acidosis -Nephrology consulted, continue fluids/bicarbonate, acidosis appears to be improving -Foley catheter was placed  Acute hypoxic respiratory failure -Possibly due to #1, currently on nonrebreather, attempt to wean off.  Chest x-ray without significant pulmonary edema  Acute metabolic encephalopathy -Possibly delirium due to sepsis versus caused by renal failure/metabolic acidosis/electrolyte disturbances, UDS was negative alcohol level was negative as well -CT scan of the head without acute findings -If it persists following improvement in his renal function will need an MRI but currently too restless to do that   History of ethanol abuse -Placed on CIWA  Scheduled Meds: . Chlorhexidine Gluconate Cloth  6 each Topical Daily  . folic acid  1 mg Oral Daily  . heparin  5,000 Units Subcutaneous Q8H  . multivitamin with minerals  1 tablet Oral Daily  . thiamine  100 mg Oral Daily   Or  . thiamine  100 mg Intravenous Daily  . vancomycin variable dose per unstable renal  function (pharmacist dosing)   Does not apply See admin instructions   Continuous Infusions: . azithromycin    . cefTRIAXone (ROCEPHIN)  IV    .  sodium bicarbonate (isotonic) infusion in sterile water 100 mL/hr at 02/05/20 0514   PRN Meds:.LORazepam **OR** LORazepam  DVT prophylaxis: heparin Code Status: Full code Family Communication: no family at bedside Patient admitted from: home Anticipated d/c place: TBD Barriers to d/c: Persistent encephalopathy, continues to have renal failure with metabolic acidosis  Consultants:  Nephrology  Procedures:  None   Microbiology  None   Antimicrobials: Ceftriaxone / Azithromycin 4/13 >>    Objective: Vitals:   02/05/20 0400 02/05/20 0500 02/05/20 0600 02/05/20 0835  BP: 104/63 113/60 110/69 105/73  Pulse:    (!) 106  Resp:    15  Temp:    97.8 F (36.6 C)  TempSrc:    Axillary  SpO2:    95%  Weight:        Intake/Output Summary (Last 24 hours) at 02/05/2020 1117 Last data filed at 02/05/2020 1102 Gross per 24 hour  Intake 2703.81 ml  Output 2650 ml  Net 53.81 ml   Filed Weights   02/05/20 0058  Weight: 103.6 kg    Examination:  Constitutional: Restless Eyes: no scleral icterus ENMT: Mucous membranes are dry.  Neck: normal, supple Respiratory: Shallow respirations, tachypneic, no wheezing Cardiovascular: Regular rate and rhythm, no murmurs / rubs / gallops. No LE edema.  Tachycardic Abdomen: non distended, no tenderness. Bowel sounds positive.  Musculoskeletal: no clubbing / cyanosis.  Skin: no rashes Neurologic: Does not follow commands, appears to move all 4 extremities independently   Data Reviewed: I  have independently reviewed following labs and imaging studies   CBC: Recent Labs  Lab 02/04/20 1813 02/05/20 0544  WBC 24.4* 17.1*  NEUTROABS 22.0*  --   HGB 14.2 12.7*  HCT 42.1 37.7*  MCV 95.5 95.0  PLT 254 161   Basic Metabolic Panel: Recent Labs  Lab 02/04/20 1813 02/05/20 0003  02/05/20 0544  NA 126*  --  132*  K 5.4*  --  4.9  CL 89*  --  98  CO2 17*  --  18*  GLUCOSE 106*  --  89  BUN 91*  --  84*  CREATININE 6.35*  --  4.19*  CALCIUM 8.3*  --  7.6*  MG  --  2.7*  --   PHOS  --  6.7*  --    Liver Function Tests: Recent Labs  Lab 02/04/20 1813 02/05/20 0544  AST 103* 75*  ALT 63* 54*  ALKPHOS 110 94  BILITOT 1.1 1.1  PROT 7.8 6.0*  ALBUMIN 3.5 2.4*   Coagulation Profile: Recent Labs  Lab 02/04/20 1813  INR 1.2   HbA1C: No results for input(s): HGBA1C in the last 72 hours. CBG: Recent Labs  Lab 02/04/20 1741 02/05/20 0100  GLUCAP 111* 77    Recent Results (from the past 240 hour(s))  Respiratory Panel by RT PCR (Flu A&B, Covid) - Nasopharyngeal Swab     Status: None   Collection Time: 02/04/20  6:13 PM   Specimen: Nasopharyngeal Swab  Result Value Ref Range Status   SARS Coronavirus 2 by RT PCR NEGATIVE NEGATIVE Final    Comment: (NOTE) SARS-CoV-2 target nucleic acids are NOT DETECTED. The SARS-CoV-2 RNA is generally detectable in upper respiratoy specimens during the acute phase of infection. The lowest concentration of SARS-CoV-2 viral copies this assay can detect is 131 copies/mL. A negative result does not preclude SARS-Cov-2 infection and should not be used as the sole basis for treatment or other patient management decisions. A negative result may occur with  improper specimen collection/handling, submission of specimen other than nasopharyngeal swab, presence of viral mutation(s) within the areas targeted by this assay, and inadequate number of viral copies (<131 copies/mL). A negative result must be combined with clinical observations, patient history, and epidemiological information. The expected result is Negative. Fact Sheet for Patients:  PinkCheek.be Fact Sheet for Healthcare Providers:  GravelBags.it This test is not yet ap proved or cleared by the [place] FDA and  has been authorized for detection and/or diagnosis of SARS-CoV-2 by FDA under an Emergency Use Authorization (EUA). This EUA will remain  in effect (meaning this test can be used) for the duration of the COVID-19 declaration under Section 564(b)(1) of the Act, 21 U.S.C. section 360bbb-3(b)(1), unless the authorization is terminated or revoked sooner.    Influenza A by PCR NEGATIVE NEGATIVE Final   Influenza B by PCR NEGATIVE NEGATIVE Final    Comment: (NOTE) The Xpert Xpress SARS-CoV-2/FLU/RSV assay is intended as an aid in  the diagnosis of influenza from Nasopharyngeal swab specimens and  should not be used as a sole basis for treatment. Nasal washings and  aspirates are unacceptable for Xpert Xpress SARS-CoV-2/FLU/RSV  testing. Fact Sheet for Patients: PinkCheek.be Fact Sheet for Healthcare Providers: GravelBags.it This test is not yet approved or cleared by the [place] FDA and  has been authorized for detection and/or diagnosis of SARS-CoV-2 by  FDA under an Emergency Use Authorization (EUA). This EUA will remain  in effect (meaning this test can be  used) for the duration of the  Covid-19 declaration under Section 564(b)(1) of the Act, 21  U.S.C. section 360bbb-3(b)(1), unless the authorization is  terminated or revoked. Performed at [facility], [address] [place], [place] [address]      Radiology Studies: CT Head Wo Contrast  Result Date: 02/04/2020 CLINICAL DATA:  Encephalopathy EXAM: CT HEAD WITHOUT CONTRAST TECHNIQUE: Contiguous axial images were obtained from the base of the skull through the vertex without intravenous contrast. COMPARISON:  None. FINDINGS: Brain: There is no mass, hemorrhage or extra-axial collection. The size and configuration of the ventricles and extra-axial CSF spaces are normal. The brain parenchyma is normal, without acute or chronic infarction.  Vascular: No abnormal hyperdensity of the major intracranial arteries or dural venous sinuses. No intracranial atherosclerosis. Skull: The visualized skull base, calvarium and extracranial soft tissues are normal. Sinuses/Orbits: No fluid levels or advanced mucosal thickening of the visualized paranasal sinuses. No mastoid or middle ear effusion. The orbits are normal. IMPRESSION: Normal head CT. Electronically Signed   By: Deatra Robinson M.D.   On: 02/04/2020 19:44   US Abdomen Complete  Result Date: 02/04/2020 CLINICAL DATA:  Acute renal insufficiency, elevated liver enzymes, hypertension EXAM: ABDOMEN ULTRASOUND COMPLETE COMPARISON:  None. FINDINGS: Gallbladder: No gallstones or wall thickening visualized. No sonographic Murphy sign noted by sonographer. Common bile duct: Diameter: 3 mm Liver: Diffuse increased liver echotexture consistent with steatosis. No focal abnormalities. Portal vein is patent on color Doppler imaging with normal direction of blood flow towards the liver. IVC: No abnormality visualized. Pancreas: No gross abnormalities.  Evaluation limited by bowel gas. Spleen: No evidence of splenomegaly. Evaluation limited by body habitus and acoustic window. Right Kidney: Length: 13.0 cm. Echogenicity within normal limits. No mass or hydronephrosis visualized. Left Kidney: Length: 11.2 cm. Echogenicity within normal limits. No mass or hydronephrosis visualized. Abdominal aorta: No aneurysm visualized. Other findings: None. IMPRESSION: 1. Hepatic steatosis. 2. Otherwise unremarkable exam. Electronically Signed   By: Sharlet Salina M.D.   On: 02/04/2020 22:31   DG Chest Portable 1 View  Result Date: 02/04/2020 CLINICAL DATA:  Hypoxia. EXAM: PORTABLE CHEST 1 VIEW COMPARISON:  None. FINDINGS: The heart size and mediastinal contours are within normal limits. No pneumothorax or pleural effusion is noted. Mild bibasilar opacities are noted concerning for possible infiltrates, atelectasis or less likely  edema. The visualized skeletal structures are unremarkable. IMPRESSION: Mild bibasilar opacities are noted concerning for possible infiltrates, atelectasis or less likely edema. Electronically Signed   By: [name] M.D.   On: 02/04/2020 18:47   Pamella Pert, MD, PhD [facility]  Between 7 am - 7 pm I am available, please contact me via Amion or Securechat  Between 7 pm - 7 am I am not available, please contact night coverage MD/APP via Amion

## 2020-02-05 NOTE — ED Notes (Signed)
Spoke to family per request about new bed placement.

## 2020-02-05 NOTE — Progress Notes (Signed)
Received patient from [facility] around 0100 on 15L NRB, sats 90-94%. Pt. arousable to stimulation (sternal rub). Opening eyes, but not tracking. Unable to answer any questions. Following simple commands (lift, head, open eyes). Weak cough, diminished lung sounds. Concerned for patient's ability to protect his airway. Paged hospitalist d/t concern for his mental state. ABG ordered.   ABG results: 7.18/48/78.8/17.2. NS gtt D/C'ed and Bicarb gtt started.

## 2020-02-05 NOTE — Progress Notes (Signed)
CM at bedside to offer resources for substance abuse. Patient is currently not responsive. CM will continue to follow and offer resources when patient is more responsive.

## 2020-02-06 ENCOUNTER — Inpatient Hospital Stay (HOSPITAL_COMMUNITY): Payer: Self-pay

## 2020-02-06 ENCOUNTER — Encounter (HOSPITAL_COMMUNITY): Payer: Self-pay | Admitting: Internal Medicine

## 2020-02-06 DIAGNOSIS — A419 Sepsis, unspecified organism: Principal | ICD-10-CM

## 2020-02-06 DIAGNOSIS — R6520 Severe sepsis without septic shock: Secondary | ICD-10-CM

## 2020-02-06 DIAGNOSIS — J9601 Acute respiratory failure with hypoxia: Secondary | ICD-10-CM

## 2020-02-06 LAB — COMPREHENSIVE METABOLIC PANEL
ALT: 54 U/L — ABNORMAL HIGH (ref 0–44)
AST: 62 U/L — ABNORMAL HIGH (ref 15–41)
Albumin: 2.5 g/dL — ABNORMAL LOW (ref 3.5–5.0)
Alkaline Phosphatase: 118 U/L (ref 38–126)
Anion gap: 14 (ref 5–15)
BUN: 34 mg/dL — ABNORMAL HIGH (ref 6–20)
CO2: 26 mmol/L (ref 22–32)
Calcium: 8.8 mg/dL — ABNORMAL LOW (ref 8.9–10.3)
Chloride: 102 mmol/L (ref 98–111)
Creatinine, Ser: 1.21 mg/dL (ref 0.61–1.24)
GFR calc Af Amer: >60 mL/min (ref >60)
GFR calc non Af Amer: >60 mL/min (ref >60)
Glucose, Bld: 102 mg/dL — ABNORMAL HIGH (ref 70–99)
Potassium: 4.3 mmol/L (ref 3.5–5.1)
Sodium: 142 mmol/L (ref 135–145)
Total Bilirubin: 1.4 mg/dL — ABNORMAL HIGH (ref 0.3–1.2)
Total Protein: 6.7 g/dL (ref 6.5–8.1)

## 2020-02-06 LAB — BLOOD GAS, ARTERIAL
Acid-Base Excess: 5.6 mmol/L — ABNORMAL HIGH (ref 0.0–2.0)
Bicarbonate: 30 mmol/L — ABNORMAL HIGH (ref 20.0–28.0)
FIO2: 100
O2 Saturation: 89.8 %
Patient temperature: 37
pCO2 arterial: 47.1 mmHg (ref 32.0–48.0)
pH, Arterial: 7.419 (ref 7.350–7.450)
pO2, Arterial: 54.6 mmHg — ABNORMAL LOW (ref 83.0–108.0)

## 2020-02-06 LAB — POCT I-STAT 7, (LYTES, BLD GAS, ICA,H+H)
Acid-Base Excess: 12 mmol/L — ABNORMAL HIGH (ref 0.0–2.0)
Bicarbonate: 38.2 mmol/L — ABNORMAL HIGH (ref 20.0–28.0)
Calcium, Ion: 1.17 mmol/L (ref 1.15–1.40)
HCT: 36 % — ABNORMAL LOW (ref 39.0–52.0)
Hemoglobin: 12.2 g/dL — ABNORMAL LOW (ref 13.0–17.0)
O2 Saturation: 100 %
Patient temperature: 99.6
Potassium: 4.2 mmol/L (ref 3.5–5.1)
Sodium: 142 mmol/L (ref 135–145)
TCO2: 40 mmol/L — ABNORMAL HIGH (ref 22–32)
pCO2 arterial: 56.8 mmHg — ABNORMAL HIGH (ref 32.0–48.0)
pH, Arterial: 7.438 (ref 7.350–7.450)
pO2, Arterial: 431 mmHg — ABNORMAL HIGH (ref 83.0–108.0)

## 2020-02-06 LAB — URINE CULTURE: Culture: NO GROWTH

## 2020-02-06 LAB — CBC
HCT: 38.9 % — ABNORMAL LOW (ref 39.0–52.0)
Hemoglobin: 13.2 g/dL (ref 13.0–17.0)
MCH: 31.7 pg (ref 26.0–34.0)
MCHC: 33.9 g/dL (ref 30.0–36.0)
MCV: 93.5 fL (ref 80.0–100.0)
Platelets: 242 10*3/uL (ref 150–400)
RBC: 4.16 MIL/uL — ABNORMAL LOW (ref 4.22–5.81)
RDW: 14.3 % (ref 11.5–15.5)
WBC: 16.6 10*3/uL — ABNORMAL HIGH (ref 4.0–10.5)
nRBC: 0 % (ref 0.0–0.2)

## 2020-02-06 LAB — GLUCOSE, CAPILLARY
Glucose-Capillary: 102 mg/dL — ABNORMAL HIGH (ref 70–99)
Glucose-Capillary: 137 mg/dL — ABNORMAL HIGH (ref 70–99)
Glucose-Capillary: 128 mg/dL — ABNORMAL HIGH (ref 70–99)
Glucose-Capillary: 106 mg/dL — ABNORMAL HIGH (ref 70–99)

## 2020-02-06 LAB — PHOSPHORUS: Phosphorus: 2.2 mg/dL — ABNORMAL LOW (ref 2.5–4.6)

## 2020-02-06 LAB — TRIGLYCERIDES: Triglycerides: 786 mg/dL — ABNORMAL HIGH (ref <150)

## 2020-02-06 LAB — MAGNESIUM: Magnesium: 2.2 mg/dL (ref 1.7–2.4)

## 2020-02-06 MED ORDER — OXCARBAZEPINE 300 MG/5ML PO SUSP
300.0000 mg | Freq: Two times a day (BID) | ORAL | Status: DC
  Administered 2020-02-06: 300 mg via ORAL
  Filled 2020-02-06 (×2): qty 5

## 2020-02-06 MED ORDER — DOCUSATE SODIUM 50 MG/5ML PO LIQD
100.0000 mg | Freq: Two times a day (BID) | ORAL | Status: DC
  Administered 2020-02-06 (×2): 100 mg via ORAL
  Filled 2020-02-06 (×2): qty 10

## 2020-02-06 MED ORDER — MIDAZOLAM HCL 2 MG/2ML IJ SOLN: 2.0000 mg | Freq: Once | INTRAMUSCULAR | Status: AC

## 2020-02-06 MED ORDER — LORAZEPAM 2 MG/ML IJ SOLN: 2.0000 mg | Freq: Once | INTRAMUSCULAR | Status: AC

## 2020-02-06 MED ORDER — SODIUM CHLORIDE 0.9% FLUSH
10.0000 mL | Freq: Two times a day (BID) | INTRAVENOUS | Status: DC
  Administered 2020-02-06 – 2020-02-12 (×8): 10 mL

## 2020-02-06 MED ORDER — MIDAZOLAM HCL 2 MG/2ML IJ SOLN
INTRAMUSCULAR | Status: AC
  Filled 2020-02-06: qty 2

## 2020-02-06 MED ORDER — NOREPINEPHRINE 4 MG/250ML-% IV SOLN
0.0000 ug/min | INTRAVENOUS | Status: DC
  Administered 2020-02-06: 2 ug/min via INTRAVENOUS
  Filled 2020-02-06: qty 250

## 2020-02-06 MED ORDER — SODIUM CHLORIDE 0.9 % IV BOLUS
500.0000 mL | Freq: Once | INTRAVENOUS | Status: AC
  Administered 2020-02-06: 500 mL via INTRAVENOUS

## 2020-02-06 MED ORDER — MIDAZOLAM HCL 2 MG/2ML IJ SOLN
2.0000 mg | Freq: Once | INTRAMUSCULAR | Status: AC
  Administered 2020-02-06: 2 mg via INTRAVENOUS

## 2020-02-06 MED ORDER — FOLIC ACID 5 MG/ML IJ SOLN
1.0000 mg | Freq: Every day | INTRAMUSCULAR | Status: DC
  Administered 2020-02-06 – 2020-02-10 (×5): 1 mg via INTRAVENOUS
  Filled 2020-02-06 (×7): qty 0.2

## 2020-02-06 MED ORDER — FENTANYL CITRATE (PF) 100 MCG/2ML IJ SOLN: 50.0000 ug | INTRAMUSCULAR | Status: DC | PRN

## 2020-02-06 MED ORDER — MIDAZOLAM BOLUS VIA INFUSION
1.0000 mg | INTRAVENOUS | Status: DC | PRN
  Administered 2020-02-06 (×3): 2 mg via INTRAVENOUS
  Filled 2020-02-06: qty 2

## 2020-02-06 MED ORDER — DEXMEDETOMIDINE HCL IN NACL 400 MCG/100ML IV SOLN
0.4000 ug/kg/h | INTRAVENOUS | Status: DC
  Administered 2020-02-06 – 2020-02-07 (×2): 0.4 ug/kg/h via INTRAVENOUS
  Administered 2020-02-07 (×2): 0.7 ug/kg/h via INTRAVENOUS
  Administered 2020-02-08: 1 ug/kg/h via INTRAVENOUS
  Administered 2020-02-08: 0.7 ug/kg/h via INTRAVENOUS
  Administered 2020-02-08: 1 ug/kg/h via INTRAVENOUS
  Administered 2020-02-08 (×2): 0.7 ug/kg/h via INTRAVENOUS
  Administered 2020-02-09 (×6): 1.2 ug/kg/h via INTRAVENOUS
  Administered 2020-02-10: 0.9 ug/kg/h via INTRAVENOUS
  Administered 2020-02-10 (×2): 1.2 ug/kg/h via INTRAVENOUS
  Filled 2020-02-06: qty 100
  Filled 2020-02-06: qty 200
  Filled 2020-02-06 (×2): qty 100
  Filled 2020-02-06: qty 200
  Filled 2020-02-06 (×13): qty 100

## 2020-02-06 MED ORDER — ROCURONIUM BROMIDE 50 MG/5ML IV SOLN
50.0000 mg | Freq: Once | INTRAVENOUS | Status: AC
  Administered 2020-02-06: 50 mg via INTRAVENOUS
  Filled 2020-02-06: qty 5

## 2020-02-06 MED ORDER — ETOMIDATE 2 MG/ML IV SOLN
20.0000 mg | Freq: Once | INTRAVENOUS | Status: AC
  Administered 2020-02-06: 20 mg via INTRAVENOUS

## 2020-02-06 MED ORDER — MIDAZOLAM HCL 2 MG/2ML IJ SOLN
INTRAMUSCULAR | Status: AC
  Administered 2020-02-06: 2 mg via INTRAVENOUS
  Filled 2020-02-06: qty 2

## 2020-02-06 MED ORDER — ROCURONIUM BROMIDE 10 MG/ML (PF) SYRINGE
PREFILLED_SYRINGE | INTRAVENOUS | Status: AC
  Administered 2020-02-06: 50 mg
  Filled 2020-02-06: qty 10

## 2020-02-06 MED ORDER — SODIUM CHLORIDE 0.9% FLUSH: 10.0000 mL | INTRAVENOUS | Status: DC | PRN

## 2020-02-06 MED ORDER — MIDAZOLAM 50MG/50ML (1MG/ML) PREMIX INFUSION
0.0000 mg/h | INTRAVENOUS | Status: DC
  Administered 2020-02-06: 3 mg/h via INTRAVENOUS
  Administered 2020-02-06: 10 mg/h via INTRAVENOUS
  Administered 2020-02-07: 9 mg/h via INTRAVENOUS
  Administered 2020-02-07: 4 mg/h via INTRAVENOUS
  Filled 2020-02-06 (×4): qty 50

## 2020-02-06 MED ORDER — ROCURONIUM BROMIDE 50 MG/5ML IV SOLN
50.0000 mg | Freq: Once | INTRAVENOUS | Status: AC
  Administered 2020-02-06: 50 mg via INTRAVENOUS

## 2020-02-06 MED ORDER — CHLORHEXIDINE GLUCONATE CLOTH 2 % EX PADS
6.0000 | MEDICATED_PAD | Freq: Every day | CUTANEOUS | Status: DC
  Administered 2020-02-08 – 2020-02-10 (×4): 6 via TOPICAL

## 2020-02-06 MED ORDER — LORAZEPAM 2 MG/ML IJ SOLN
INTRAMUSCULAR | Status: AC
  Administered 2020-02-06: 2 mg via INTRAVENOUS
  Filled 2020-02-06: qty 1

## 2020-02-06 MED ORDER — DEXMEDETOMIDINE HCL IN NACL 400 MCG/100ML IV SOLN
0.4000 ug/kg/h | INTRAVENOUS | Status: DC
  Administered 2020-02-06: 0.6 ug/kg/h via INTRAVENOUS
  Filled 2020-02-06: qty 100

## 2020-02-06 MED ORDER — FENTANYL CITRATE (PF) 100 MCG/2ML IJ SOLN: 100.0000 ug | Freq: Once | INTRAMUSCULAR | Status: AC

## 2020-02-06 MED ORDER — POLYETHYLENE GLYCOL 3350 17 G PO PACK
17.0000 g | PACK | Freq: Every day | ORAL | Status: DC
  Administered 2020-02-06: 17 g via ORAL
  Filled 2020-02-06: qty 1

## 2020-02-06 MED ORDER — DEXMEDETOMIDINE HCL IN NACL 400 MCG/100ML IV SOLN
0.4000 ug/kg/h | INTRAVENOUS | Status: DC
  Administered 2020-02-06: 1 ug/kg/h via INTRAVENOUS
  Administered 2020-02-06: 0.6 ug/kg/h via INTRAVENOUS
  Filled 2020-02-06 (×2): qty 100

## 2020-02-06 MED ORDER — M.V.I. ADULT IV INJ
INTRAVENOUS | Status: DC
  Filled 2020-02-06: qty 10

## 2020-02-06 MED ORDER — FENTANYL CITRATE (PF) 100 MCG/2ML IJ SOLN
INTRAMUSCULAR | Status: AC
  Administered 2020-02-06: 100 ug via INTRAVENOUS
  Filled 2020-02-06: qty 2

## 2020-02-06 MED ORDER — FENTANYL 2500MCG IN NS 250ML (10MCG/ML) PREMIX INFUSION
0.0000 ug/h | INTRAVENOUS | Status: DC
  Administered 2020-02-06: 400 ug/h via INTRAVENOUS
  Administered 2020-02-06: 200 ug/h via INTRAVENOUS
  Administered 2020-02-07: 350 ug/h via INTRAVENOUS
  Administered 2020-02-07: 300 ug/h via INTRAVENOUS
  Administered 2020-02-08: 250 ug/h via INTRAVENOUS
  Administered 2020-02-08: 300 ug/h via INTRAVENOUS
  Administered 2020-02-09: 400 ug/h via INTRAVENOUS
  Administered 2020-02-09: 325 ug/h via INTRAVENOUS
  Administered 2020-02-09 – 2020-02-10 (×4): 400 ug/h via INTRAVENOUS
  Filled 2020-02-06 (×14): qty 250

## 2020-02-06 MED ORDER — ORAL CARE MOUTH RINSE
15.0000 mL | OROMUCOSAL | Status: DC
  Administered 2020-02-06 – 2020-02-10 (×39): 15 mL via OROMUCOSAL

## 2020-02-06 MED ORDER — FENTANYL CITRATE (PF) 100 MCG/2ML IJ SOLN
50.0000 ug | INTRAMUSCULAR | Status: AC | PRN
  Administered 2020-02-06 (×3): 50 ug via INTRAVENOUS
  Filled 2020-02-06: qty 2

## 2020-02-06 MED ORDER — FUROSEMIDE 10 MG/ML IJ SOLN
60.0000 mg | Freq: Once | INTRAMUSCULAR | Status: AC
  Administered 2020-02-06: 60 mg via INTRAVENOUS
  Filled 2020-02-06: qty 6

## 2020-02-06 MED ORDER — SODIUM PHOSPHATES 45 MMOLE/15ML IV SOLN
10.0000 mmol | Freq: Once | INTRAVENOUS | Status: AC
  Administered 2020-02-06: 10 mmol via INTRAVENOUS
  Filled 2020-02-06: qty 3.33

## 2020-02-06 MED ORDER — SODIUM CHLORIDE 0.9 % IV SOLN: INTRAVENOUS | Status: DC | PRN

## 2020-02-06 MED ORDER — CHLORHEXIDINE GLUCONATE 0.12% ORAL RINSE (MEDLINE KIT)
15.0000 mL | Freq: Two times a day (BID) | OROMUCOSAL | Status: DC
  Administered 2020-02-06 – 2020-02-10 (×8): 15 mL via OROMUCOSAL

## 2020-02-06 MED ORDER — PROPOFOL 1000 MG/100ML IV EMUL
5.0000 ug/kg/min | INTRAVENOUS | Status: DC
  Administered 2020-02-06 (×3): 30 ug/kg/min via INTRAVENOUS
  Filled 2020-02-06: qty 100

## 2020-02-06 NOTE — Progress Notes (Signed)
Dentures given to Donald Carroll (girlfriend) to take home.

## 2020-02-06 NOTE — Progress Notes (Signed)
eLink Physician-Brief Progress Note Patient Name: Donald Carroll DOB: September 30, 1966 MRN: [id number]   Date of Service  02/06/2020  HPI/Events of Note  Intermittent severe agitation despite versed drip at 10 mg/hr and fentanyl drip at 400 mcg/hr. Trying not to use propofol due to hypertriglyceridemia.   eICU Interventions  Add Precedex drip.     Intervention Category Major Interventions: Delirium, psychosis, severe agitation - evaluation and management  Janae Bridgeman 02/06/2020, 10:18 PM

## 2020-02-06 NOTE — Progress Notes (Addendum)
Patient developed respiratory distress with RR in 30s and increasingly restless.   CXR obtained, radiology read pending, looks like acute CHF. Plan to stop IVF and give IV Lasix now.   ADDENDUM:  Diuresing well but remains obtunded, RR 30, ABG with PaO2 55.   Discussed with PCCM, their help much appreciated.

## 2020-02-06 NOTE — Procedures (Signed)
Central Venous Catheter Insertion Procedure Note Donald Carroll [id number] [birth year]  Procedure: Insertion of Central Venous Catheter Indications: Assessment of intravascular volume, Drug and/or fluid administration and Frequent blood sampling  Procedure Details Consent: Risks of procedure as well as the alternatives and risks of each were explained to the (patient/caregiver).  Consent for procedure obtained. Time Out: Verified patient identification, verified procedure, site/side was marked, verified correct patient position, special equipment/implants available, medications/allergies/relevent history reviewed, required imaging and test results available.  Performed  Maximum sterile technique was used including antiseptics, cap, gloves, gown, hand hygiene, mask and sheet. Skin prep: Chlorhexidine; local anesthetic administered A antimicrobial bonded/coated triple lumen catheter was placed in the left internal jugular vein using the Seldinger technique. Ultrasound guidance used.Yes.   Catheter placed to 20 cm. Blood aspirated via all 3 ports and then flushed x 3. Line sutured x 2 and dressing applied.  Evaluation Blood flow good Complications: No apparent complications Patient did tolerate procedure well. Chest X-ray ordered to verify placement.  CXR: pending.   Left I j v  Brett Canales [name] ACNP Acute Care Nurse Practitioner [facility] Pulmonary/Critical Care Please consult Amion 02/06/2020, 11:50 AM

## 2020-02-06 NOTE — Progress Notes (Signed)
EEG completed, results pending. 

## 2020-02-06 NOTE — Progress Notes (Signed)
OVERNIGHT EVENTS:   Pt. Remains on NRB 100% 15L upon start of shift. Pt. Is more arousable, still confused but re-directable. Per Marcelino Duster (friend and POC) states that patient hasn't had alcohol in "close to a year". Throughout shift, RR increasing along with increased agitation requiring PRN Ativan. Pt. Becoming more confused, removing NRB frequently, no command following and not easily re-directable. Paged hospitalist about increased agitation and WOB. Chest xray obtained and ABG. Chest xray looking worse. Fluids stopped. 60mg  Lasix given. 2.3L output after Lasix administration.   0500 ABG- 7.419/47.1/54.6/30- Called hospitalist. Critical care ground team to evaluate patient. Order to transfer to ICU for higher leverl of care obtained.   0600- Pt. Transferred to ICU 21M 14 with no complications.

## 2020-02-06 NOTE — Progress Notes (Addendum)
Patient ID: Donald Carroll, male   DOB: 1966/10/18, 54 y.o.   MRN: [id number] [facility] Progress Note   Assessment/ Plan:   1. Acute kidney Injury: With normal renal function at baseline.  Overnight with significant improvement of renal function following placement of Foley catheter and fluid challenge (urine electrolytes indicative of significant intravascular volume contraction).  Renal/abdominal ultrasound negative for hydronephrosis.  Intravenous fluids discontinued yesterday after development of pulmonary edema with appropriate management with furosemide; respiratory status improving. 2.  Hyponatremia: Corrected with intravenous fluids; monitor cautiously with mental status that can limit water intake/diuresis. 3.  Anion gap metabolic acidosis: Secondary to acute kidney injury, corrected with isotonic sodium bicarbonate. 4.  Altered mental status: With underlying history of mental illness, will continue to follow with ongoing intravenous fluids.  Renal function back toward baseline, will sign off.  Call with questions or concerns.  Subjective:   Transfer to ICU yesterday evening after developing increasing respiratory distress likely from volume overload; improved with diuretics.   Objective:   BP 132/83   Pulse (!) 110   Temp 97.7 F (36.5 C) (Axillary)   Resp (!) 28   Ht 6' (1.829 m)   Wt 103.6 kg   SpO2 98%   BMI 30.98 kg/m   Intake/Output Summary (Last 24 hours) at 02/06/2020 0715 Last data filed at 02/06/2020 0600 Gross per 24 hour  Intake 3900 ml  Output 3075 ml  Net 825 ml   Weight change:   Physical Exam: Gen: Resting comfortably in bed, on Precedex drip. CVS: Pulse regular tachycardia, S1 and S2 with ejection systolic murmur Resp: Coarse breath sounds anteriorly, no distinct rales or rhonchi Abd: Soft, obese, nontender Ext: No lower extremity edema.  Tattoos over upper extremities noted.  Imaging: CT Head Wo Contrast  Result Date:  02/04/2020 CLINICAL DATA:  Encephalopathy EXAM: CT HEAD WITHOUT CONTRAST TECHNIQUE: Contiguous axial images were obtained from the base of the skull through the vertex without intravenous contrast. COMPARISON:  None. FINDINGS: Brain: There is no mass, hemorrhage or extra-axial collection. The size and configuration of the ventricles and extra-axial CSF spaces are normal. The brain parenchyma is normal, without acute or chronic infarction. Vascular: No abnormal hyperdensity of the major intracranial arteries or dural venous sinuses. No intracranial atherosclerosis. Skull: The visualized skull base, calvarium and extracranial soft tissues are normal. Sinuses/Orbits: No fluid levels or advanced mucosal thickening of the visualized paranasal sinuses. No mastoid or middle ear effusion. The orbits are normal. IMPRESSION: Normal head CT. Electronically Signed   By: [name] M.D.   On: 02/04/2020 19:44   US Abdomen Complete  Result Date: 02/04/2020 CLINICAL DATA:  Acute renal insufficiency, elevated liver enzymes, hypertension EXAM: ABDOMEN ULTRASOUND COMPLETE COMPARISON:  None. FINDINGS: Gallbladder: No gallstones or wall thickening visualized. No sonographic Murphy sign noted by sonographer. Common bile duct: Diameter: 3 mm Liver: Diffuse increased liver echotexture consistent with steatosis. No focal abnormalities. Portal vein is patent on color Doppler imaging with normal direction of blood flow towards the liver. IVC: No abnormality visualized. Pancreas: No gross abnormalities.  Evaluation limited by bowel gas. Spleen: No evidence of splenomegaly. Evaluation limited by body habitus and acoustic window. Right Kidney: Length: 13.0 cm. Echogenicity within normal limits. No mass or hydronephrosis visualized. Left Kidney: Length: 11.2 cm. Echogenicity within normal limits. No mass or hydronephrosis visualized. Abdominal aorta: No aneurysm visualized. Other findings: None. IMPRESSION: 1. Hepatic steatosis. 2.  Otherwise unremarkable exam. Electronically Signed   By: [name]  M.D.   On: 02/04/2020 22:31   DG CHEST PORT 1 VIEW  Result Date: 02/06/2020 CLINICAL DATA:  Acute respiratory distress EXAM: PORTABLE CHEST 1 VIEW COMPARISON:  02/04/2020 FINDINGS: Single frontal view of the chest demonstrates progressive bilateral airspace disease. No effusion or pneumothorax. Cardiac silhouette is stable. IMPRESSION: 1. Progressive diffuse bilateral airspace disease which may reflect worsening pulmonary edema or multifocal pneumonia. Electronically Signed   By: Sharlet Salina M.D.   On: 02/06/2020 03:14   DG Chest Portable 1 View  Result Date: 02/04/2020 CLINICAL DATA:  Hypoxia. EXAM: PORTABLE CHEST 1 VIEW COMPARISON:  None. FINDINGS: The heart size and mediastinal contours are within normal limits. No pneumothorax or pleural effusion is noted. Mild bibasilar opacities are noted concerning for possible infiltrates, atelectasis or less likely edema. The visualized skeletal structures are unremarkable. IMPRESSION: Mild bibasilar opacities are noted concerning for possible infiltrates, atelectasis or less likely edema. Electronically Signed   By: [name] M.D.   On: 02/04/2020 18:47    Labs: BMET Recent Labs  Lab 02/04/20 1813 02/05/20 0003 02/05/20 0544 02/05/20 1146 02/06/20 0327  NA 126*  --  132* 134* 142  K 5.4*  --  4.9 4.5 4.3  CL 89*  --  98 99 102  CO2 17*  --  18* 21* 26  GLUCOSE 106*  --  89 91 102*  BUN 91*  --  84* 70* 34*  CREATININE 6.35*  --  4.19* 2.78* 1.21  CALCIUM 8.3*  --  7.6* 8.0* 8.8*  PHOS  --  6.7*  --   --  2.2*   CBC Recent Labs  Lab 02/04/20 1813 02/05/20 0544 02/06/20 0327  WBC 24.4* 17.1* 16.6*  NEUTROABS 22.0*  --   --   HGB 14.2 12.7* 13.2  HCT 42.1 37.7* 38.9*  MCV 95.5 95.0 93.5  PLT 254 229 242    Medications:    . Chlorhexidine Gluconate Cloth  6 each Topical Daily  . folic acid  1 mg Oral Daily  . heparin  5,000 Units Subcutaneous Q8H  .  multivitamin with minerals  1 tablet Oral Daily  . thiamine  100 mg Oral Daily   Or  . thiamine  100 mg Intravenous Daily   Zetta Bills, MD 02/06/2020, 7:15 AM

## 2020-02-06 NOTE — Progress Notes (Signed)
  ARDS presumably due to aspiration pneumonia Acute encephalopathy -unclear cause, UDS negative, he has not been drinking for several months so doubt alcohol withdrawal, he had severe breakthrough agitation requiring restraints and several people to hold him down would not keep his high flow nasal cannula on  -He was emergently intubated, placed on PEEP of 10 -Vent and sedation orders given, will be sedated with propofol and fentanyl Chest x-ray showed good placement of ET tube, central line placed  Spot EEG was negative, will obtain neurology input as to cause of encephalopathy Son and ex-wife updated in detail at bedside  Additional critical care time x 42m  Donald Carroll V. Vassie Loll MD

## 2020-02-06 NOTE — Procedures (Addendum)
Patient Name: Donald Carroll  MRN: [id number]  Epilepsy Attending: Charlsie Quest  Referring Physician/Provider: Dr. Beola Cord Date: 02/06/2020 Duration: 23.54 minutes  Patient history: 54 year old male with altered mental status.  EEG evaluate for seizures.  Level of alertness: Awake  AEDs during EEG study: Lorazepam  Technical aspects: This EEG study was done with scalp electrodes positioned according to the 10-20 International system of electrode placement. Electrical activity was acquired at a sampling rate of 500Hz  and reviewed with a high frequency filter of 70Hz  and a low frequency filter of 1Hz . EEG data were recorded continuously and digitally stored.   Description: During awake state, no clear posterior dominant rhythm was seen. EEG showed continuous generalized 3 to 5 Hz theta-delta slowing.  Triphasic waves, generalized were also noted at times. Hyperventilation and photic stimulation were not performed.  Abnormality -Triphasic waves, generalized -Continuous slow, generalized  IMPRESSION: This study is suggestive of moderate diffuse encephalopathy, nonspecific to etiology but could be secondary to toxic-metabolic causes.  No seizures or epileptiform discharges were seen throughout the recording.     [name] 

## 2020-02-06 NOTE — Consult Note (Addendum)
Neurology Consultation  Reason for Consult: Altered mental status Referring Physician: Dr. [name]  CC: AMS  History is obtained from:Chart  HPI: Donald Carroll is a 54 y.o. male has history of ETOH abuse, substance abuse, BPD and PTSD. On 4/11 he was noted to have AMS and twitching per family. Patient was brought to hospital and found to have ARDS due to aspiration pneumonia, hypertensive with acute kidney injury.  While in ICU he has been having sever breakthrough agitation. spot EEG was negative, CT head showed no acute abnormality. Today Propofol was D/C'd due to elevated triglycerides, currently on Versed and Fentanyl.    Upon consultation patient is severely sedated.  Unfortunately when sedation was taken off patient became extremely agitated and sedation needed to be restarted immediately.  Labs of importance: 4/14: Sodium 126 Potassium 5.4 BUN 91 Creatinine 6.35 AST 103 ALT 63 WBC 24.4 Phosphorus 6.7 Magnesium 2.7  4/15: Sodium 142 BUN 34 Creatinine 1.21 AST 62 ALT  54 WBC 16.6  Past Medical History:  Diagnosis Date  . Anxiety   . Depression   . Hypertension    Family History  Problem Relation Age of Onset  . Hypertension Mother   . Hypertension Father     Social History:   reports that he has been smoking cigarettes. He has been smoking about 1.00 pack per day. He has never used smokeless tobacco. He reports current alcohol use. He reports current drug use. Drug: Heroin.  Medications  Current Facility-Administered Medications:  .  0.9 %  sodium chloride infusion, , Intravenous, PRN, Rigoberto Noel, MD, Last Rate: 10 mL/hr at 02/06/20 1338, New Bag at 02/06/20 1338 .  azithromycin (ZITHROMAX) 500 mg in sodium chloride 0.9 % 250 mL IVPB, 500 mg, Intravenous, Q24H, Etta Quill, DO, Stopped at 02/05/20 1919 .  cefTRIAXone (ROCEPHIN) 2 g in sodium chloride 0.9 % 100 mL IVPB, 2 g, Intravenous, Q24H, Gardner, Jared M, DO, Last Rate: 200 mL/hr at 02/06/20  1716, 2 g at 02/06/20 1716 .  chlorhexidine gluconate (MEDLINE KIT) (PERIDEX) 0.12 % solution 15 mL, 15 mL, Mouth Rinse, BID, Rigoberto Noel, MD .  Chlorhexidine Gluconate Cloth 2 % PADS 6 each, 6 each, Topical, Daily, Donne Hazel, MD, 6 each at 02/05/20 1036 .  docusate (COLACE) 50 MG/5ML liquid 100 mg, 100 mg, Oral, BID, Neva Seat, MD .  fentaNYL (SUBLIMAZE) injection 50 mcg, 50 mcg, Intravenous, Q15 min PRN, Neva Seat, MD, 50 mcg at 02/06/20 1626 .  fentaNYL 25109mg in NS 2564m(1029mml) infusion-PREMIX, 0-400 mcg/hr, Intravenous, Continuous, MelNeva SeatD, Last Rate: 20 mL/hr at 02/06/20 1550, 200 mcg/hr at 02/06/20 1550 .  folic acid injection 1 mg, 1 mg, Intravenous, Daily, Gherghe, Costin M, MD, 1 mg at 02/06/20 1322 .  heparin injection 5,000 Units, 5,000 Units, Subcutaneous, Q8H, Gardner, Jared M, DO, 5,000 Units at 02/06/20 1336 .  MEDLINE mouth rinse, 15 mL, Mouth Rinse, 10 times per day, AlvRigoberto NoelD .  midazolam (VERSED) 2 MG/2ML injection, , , ,  .  midazolam (VERSED) 50 mg/50 mL (1 mg/mL) premix infusion, 0-10 mg/hr, Intravenous, Continuous, Santos-Sanchez, Idalys, MD, Last Rate: 3 mL/hr at 02/06/20 1624, 3 mg/hr at 02/06/20 1624 .  midazolam (VERSED) bolus via infusion 1-2 mg, 1-2 mg, Intravenous, Q2H PRN, Santos-Sanchez, Idalys, MD, 2 mg at 02/06/20 1626 .  multivitamins adult (INFUVITE ADULT) 10 mL in dextrose 5% lactated ringers 1,000 mL infusion, , Intravenous, Q24H, Gherghe, CosVella RedheadD, Last Rate: 999  mL/hr at 02/06/20 1239, New Bag at 02/06/20 1239 .  norepinephrine (LEVOPHED) 46m in 2531mpremix infusion, 0-40 mcg/min, Intravenous, Titrated, MeNeva SeatMD, Last Rate: 7.5 mL/hr at 02/06/20 1513, 2 mcg/min at 02/06/20 1513 .  polyethylene glycol (MIRALAX / GLYCOLAX) packet 17 g, 17 g, Oral, Daily, MeNeva SeatMD .  sodium chloride flush (NS) 0.9 % injection 10-40 mL, 10-40 mL, Intracatheter, Q12H, AlKara Mead, MD .  sodium  chloride flush (NS) 0.9 % injection 10-40 mL, 10-40 mL, Intracatheter, PRN, AlKara Mead, MD .  sodium phosphate 10 mmol in dextrose 5 % 250 mL infusion, 10 mmol, Intravenous, Once, MeNeva SeatMD, Last Rate: 42 mL/hr at 02/06/20 1234, 10 mmol at 02/06/20 1234 .  thiamine tablet 100 mg, 100 mg, Oral, Daily **OR** thiamine (B-1) injection 100 mg, 100 mg, Intravenous, Daily, GaAlcario DroughtJared M, DO, 100 mg at 02/06/20 1322  ROS:  Unable to obtain due to altered mental status.   Exam: Current vital signs: BP 97/71   Pulse 89   Temp 99.2 F (37.3 C) (Axillary)   Resp 18   Ht 6' (1.829 m)   Wt 103.6 kg   SpO2 99%   BMI 30.98 kg/m  Vital signs in last 24 hours: Temp:  [97.7 F (36.5 C)-101.6 F (38.7 C)] 99.2 F (37.3 C) (04/15 1603) Pulse Rate:  [78-115] 89 (04/15 1645) Resp:  [16-34] 18 (04/15 1645) BP: (71-140)/(50-97) 97/71 (04/15 1645) SpO2:  [87 %-100 %] 99 % (04/15 1645) FiO2 (%):  [70 %-100 %] 80 % (04/15 1504)  Constitutional: Appears well-developed and well-nourished.  Eyes: No scleral injection HENT: No OP obstrucion Head: Normocephalic.  Cardiovascular: Normal rate and regular rhythm.  Respiratory: Effort normal, non-labored breathing GI: Soft.  No distension. There is no tenderness.  Skin: WDI  Neuro: Mental Status: Currently patient is intubated, on fentanyl and Versed for heavy sedation secondary to severe agitation. Cranial Nerves: II: No blink to threat III,IV, VI: Doll's intact, corneals intact pupils equal, round and reactive to light VII: Facial movement is symmetric.  Motor: Moving all extremities with 5/5 due to agitation Sensory: Is briskly from noxious stimuli in all 4 extremities Deep Tendon Reflexes: 2+ and symmetric in the biceps and patellae.  Plantars: Toes are downgoing bilaterally.  Cerebellar: Unable to assess  Labs I have reviewed labs in epic and the results pertinent to this consultation are:  CBC    Component Value  Date/Time   WBC 16.6 (H) 02/06/2020 0327   RBC 4.16 (L) 02/06/2020 0327   HGB 12.2 (L) 02/06/2020 1212   HCT 36.0 (L) 02/06/2020 1212   PLT 242 02/06/2020 0327   MCV 93.5 02/06/2020 0327   MCH 31.7 02/06/2020 0327   MCHC 33.9 02/06/2020 0327   RDW 14.3 02/06/2020 0327   LYMPHSABS 0.7 02/04/2020 1813   MONOABS 1.2 (H) 02/04/2020 1813   EOSABS 0.0 02/04/2020 1813   BASOSABS 0.0 02/04/2020 1813    CMP     Component Value Date/Time   NA 142 02/06/2020 1212   K 4.2 02/06/2020 1212   CL 102 02/06/2020 0327   CO2 26 02/06/2020 0327   GLUCOSE 102 (H) 02/06/2020 0327   BUN 34 (H) 02/06/2020 0327   CREATININE 1.21 02/06/2020 0327   CALCIUM 8.8 (L) 02/06/2020 0327   PROT 6.7 02/06/2020 0327   ALBUMIN 2.5 (L) 02/06/2020 0327   AST 62 (H) 02/06/2020 0327   ALT 54 (H) 02/06/2020 0327   ALKPHOS 118 02/06/2020 0327  BILITOT 1.4 (H) 02/06/2020 0327   GFRNONAA >60 02/06/2020 0327   GFRAA >60 02/06/2020 0327    Lipid Panel     Component Value Date/Time   CHOL 263 (H) 08/07/2019 0647   TRIG 786 (H) 02/06/2020 1208   HDL 37 (L) 08/07/2019 0647   CHOLHDL 7.1 08/07/2019 0647   VLDL 48 (H) 08/07/2019 0647   LDLCALC 178 (H) 08/07/2019 0647     Imaging I have reviewed the images obtained:  CT-scan of the brain-normal head CT  Etta Quill PA-C [facility] [phone]  M-F  (9:00 am- 5:00 PM)  02/06/2020, 5:18 PM   I have seen the patient and reviewed the above note.  He presented with a fairly dense encephalopathy, very possibly attributable to his multiple medical problems, however as his numbers are improving, he continued to be encephalopathic, even needing to be intubated today due to ARDS and agitation.  On my exam, with sedation paused he is intensely agitated thrashing both arms fairly equally, he does not open eyes, when I open his eyes there is slightly disconjugate, I suspect on underlying exophoria.   Assessment:  54 year old male presenting to the  hospital with altered mental status found to have acute kidney injury in addition to ARDS in the setting of aspiration pneumonia and hypertensive.  Certainly gabapentin can cause a significant encephalopathy when coupled with renal failure.  It is still possible that this simply represents a toxic/metabolic encephalopathy due to renal failure, medication effect, respiratory failure, etc.  Improvement in mental status can lag significantly behind lab improvement, but given the severity of his symptoms, I do think further evaluation is prudent.  I favor not abruptly discontinuing oxcarbazepine and therefore I will restart it out of concern for withdrawal seizures.  He is on olanzapine at home and has a mildly elevated CK, there could be concern for neuroleptic malignant syndrome though he does not have significantly increased tone at this time which argues strongly against it.  He is also on multiple serotonergic agents, and serotonin syndrome could be possible, and he does have some hyperreflexia.  The duration is slightly long for that at this point, however.  Impression: - encephalopathy, unspecified  Recommendations: -MRI brain while patient is sedated -Continue to treat infection and keep patient's metabolic abnormalities within normal limits. -Consider high-dose thiamine x3 days -Continue CIWA protocol -Restart home oxcarbazepine, also some level -Gabapentin level  Roland Rack, MD [facility] [phone]  If 7pm- 7am, please page neurology on call as listed in [place].

## 2020-02-06 NOTE — Consult Note (Signed)
NAME:  Donald Carroll, MRN:  [id number], DOB:  21-Dec-1965, LOS: 2 ADMISSION DATE:  02/04/2020, CONSULTATION DATE:  02/06/2020 REFERRING MD:  Dr. Myna Hidalgo, CHIEF COMPLAINT:  Hypoxia, AMS  Brief History   54 yoM presenting with 1 day hx of AMS found to be hypotensive and hypoxic with AKI.  Suspected sepsis, started on empiric abx coverage, CXR initially with mild bibasilar changes.  Since ongoing encephalopathy, still requiring NRB with increasing respiratory distress.  CXR now with worsening bilateral infiltrates, PCCM consulted.   History of present illness   HPI obtained from medical chart review as patient is encephalopathic.    54 year old male with prior history of tobacco, ETOH/ substance abuse (heroin), HTN, BPD, PTSD, SI in 10/21 who presented 4/13 with one day history of altered mental status and twitching.  Unclear last ETOH/ substance abuse.  Found hypotensive and hypoxic requiring NRB.    Workup notable for WBC 24, Na 126, K 5.4, BUN 91, sCR 6.35, elevated LFTs, PCT 28.77,  CXR with mild bibasilar infiltrates, SARS 2 neg, BNB 252, CK 6010,  ETOH neg, salicylates, UDS neg.  CT head normal.  Had urinary retention requiring foley insertion.  Nephrology consulted and given more fluids with improving AKI.  Ongoing altered mental status.  On CIWA needing ativan q 2-3 hours.  Worsening PaO2, remains on NRB.  Repeat CXR overnight with progressive diffuse bilateral airspace disease thought to represent worsening pneumonia +/- multifocal pneumonia.  Remains afebrile, blood pressure stable, improving WBC.  Was given 60mg  lasix with good UOP however still requiring NRB and increased work of breathing, therefore PCCM consulted for evaluation.   Past Medical History  Tobacco abuse, ETOH/ substance abuse (heroin), HTN, BPD, PTSD, SI in 10/21  Significant Hospital Events   4/13 admit   Consults:  Nephrology   Procedures:  4/13 foley >>  Significant Diagnostic Tests:  4/13 [facility] >> neg  4/13 [place]  abd complete >>  1. Hepatic steatosis. 2. Otherwise unremarkable exam.  Micro Data:  4/13 SARS 2/ Flu A/B >>  4/13 BCx 2 >> 4/13 UC >>  4/14 MRSA PCR >> neg  Antimicrobials:  4/13 vancomycin>> 4/13 ceftriaxone >> 4/14 azithromycin >>  Interim history/subjective:   Objective   Blood pressure 130/90, pulse (!) 108, temperature 98.2 F (36.8 C), temperature source Axillary, resp. rate (!) 32, height 6' (1.829 m), weight 103.6 kg, SpO2 94 %.    FiO2 (%):  [100 %] 100 %   Intake/Output Summary (Last 24 hours) at 02/06/2020 0520 Last data filed at 02/06/2020 0400 Gross per 24 hour  Intake 3603.75 ml  Output 3675 ml  Net -71.25 ml   Filed Weights   02/05/20 0058  Weight: 103.6 kg    Examination: General:  Adult male restless in bed, intermittently trying to get out of bed HEENT: MM pink/dry, pupils 3/reactive, anicteric  Neuro: arouses to verbal, does not follow commands for me, MAE CV: rr, no murmur PULM:  Moderate WOB, diffuse rhonchi GI: soft, bs, foley Extremities: warm/dry, no LE edema  Skin: no rashes   Resolved Hospital Problem list    Assessment & Plan:   Acute hypoxic respiratory failure with developing ARDS with pulmonary edema +/- pneumonia P:  tx to ICU, high risk for intubation ABG reassuring NPO Will trial with heated high flow [place], not a candidate for BiPAP given mental status Send RVP, urine strep/ legionella S/p diuresis- remains net +1L   Acute encephalopathy- etiology remains unclear, ?sepsis +/- substance  abuse, unclear last ETOH/ substance abuse- both negative on admit P:  Trend neuro exams Given reports of twitching at home, consider EEG/ LP  Change CIWA ativan to precedex if needed  Empiric thiamine/ folate   Sepsis - PNA +/- ?CNS process P:  Continue ceftriaxone, azithro D/c vanc - MRSA neg Remains afebrile Trend PCT  Trend WBC/ Fever curve   AKI- improving  P:  Nephrology following  Trending BMP/ UOP  Best practice:    Diet: NPO Pain/Anxiety/Delirium protocol (if indicated): precedex if needed VAP protocol (if indicated): n/a DVT prophylaxis: heparin SQ GI prophylaxis: n/a Glucose control: trend on BMP Mobility: BR Code Status: Full  Family Communication: pending Disposition: tx to ICU   Labs   CBC: Recent Labs  Lab 02/04/20 1813 02/05/20 0544 02/06/20 0327  WBC 24.4* 17.1* 16.6*  NEUTROABS 22.0*  --   --   HGB 14.2 12.7* 13.2  HCT 42.1 37.7* 38.9*  MCV 95.5 95.0 93.5  PLT 254 229 242    Basic Metabolic Panel: Recent Labs  Lab 02/04/20 1813 02/05/20 0003 02/05/20 0544 02/05/20 1146 02/06/20 0327  NA 126*  --  132* 134* 142  K 5.4*  --  4.9 4.5 4.3  CL 89*  --  98 99 102  CO2 17*  --  18* 21* 26  GLUCOSE 106*  --  89 91 102*  BUN 91*  --  84* 70* 34*  CREATININE 6.35*  --  4.19* 2.78* 1.21  CALCIUM 8.3*  --  7.6* 8.0* 8.8*  MG  --  2.7*  --   --  2.2  PHOS  --  6.7*  --   --  2.2*   GFR: Estimated Creatinine Clearance: 89.9 mL/min (by C-G formula based on SCr of 1.21 mg/dL). Recent Labs  Lab 02/04/20 1813 02/04/20 2015 02/05/20 0003 02/05/20 0544 02/06/20 0327  PROCALCITON  --   --  28.77  --   --   WBC 24.4*  --   --  17.1* 16.6*  LATICACIDVEN 1.1 0.8  --   --   --     Liver Function Tests: Recent Labs  Lab 02/04/20 1813 02/05/20 0544 02/06/20 0327  AST 103* 75* 62*  ALT 63* 54* 54*  ALKPHOS 110 94 118  BILITOT 1.1 1.1 1.4*  PROT 7.8 6.0* 6.7  ALBUMIN 3.5 2.4* 2.5*   Recent Labs  Lab 02/04/20 1813  LIPASE 14   Recent Labs  Lab 02/04/20 1813  AMMONIA 9    ABG    Component Value Date/Time   PHART 7.419 02/06/2020 0417   PCO2ART 47.1 02/06/2020 0417   PO2ART 54.6 (L) 02/06/2020 0417   HCO3 30.0 (H) 02/06/2020 0417   ACIDBASEDEF 7.9 (H) 02/05/2020 0622   O2SAT 89.8 02/06/2020 0417     Coagulation Profile: Recent Labs  Lab 02/04/20 1813  INR 1.2    Cardiac Enzymes: Recent Labs  Lab 02/05/20 0544  CKTOTAL 1,445*    HbA1C: Hgb  A1c MFr Bld  Date/Time Value Ref Range Status  08/04/2019 06:05 PM 5.1 4.8 - 5.6 % Final    Comment:    (NOTE) Pre diabetes:          5.7%-6.4% Diabetes:              >6.4% Glycemic control for   <7.0% adults with diabetes   07/18/2018 06:22 AM 4.6 (L) 4.8 - 5.6 % Final    Comment:    (NOTE) Pre diabetes:  5.7%-6.4% Diabetes:              >6.4% Glycemic control for   <7.0% adults with diabetes     CBG: Recent Labs  Lab 02/04/20 1741 02/05/20 0100  GLUCAP 111* 77    Review of Systems:   Unable   Past Medical History  He,  has a past medical history of Anxiety, Depression, and Hypertension.   Surgical History   No past surgical history on file.   Social History   reports that he has been smoking cigarettes. He has been smoking about 1.00 pack per day. He has never used smokeless tobacco. He reports current alcohol use. He reports current drug use. Drug: Heroin.   Family History   His family history is not on file.   Allergies No Known Allergies   Home Medications  Prior to Admission medications   Medication Sig Start Date End Date Taking? Authorizing Provider  acamprosate (CAMPRAL) 333 MG tablet Take 2 tablets (666 mg total) by mouth 3 (three) times daily. 08/11/19   Aldean Baker, NP  amLODipine (NORVASC) 5 MG tablet Take 1 tablet (5 mg total) by mouth daily. 08/12/19   Aldean Baker, NP  DULoxetine (CYMBALTA) 60 MG capsule Take 1 capsule (60 mg total) by mouth daily. 08/12/19   Aldean Baker, NP  gabapentin (NEURONTIN) 400 MG capsule Take 1 capsule (400 mg total) by mouth 3 (three) times daily. 08/11/19   Aldean Baker, NP  lisinopril (ZESTRIL) 20 MG tablet Take 1 tablet (20 mg total) by mouth daily. 08/12/19   Aldean Baker, NP  Multiple Vitamin (MULTIVITAMIN WITH MINERALS) TABS tablet Take 1 tablet by mouth daily. 08/12/19   Aldean Baker, NP  nicotine (NICODERM CQ - DOSED IN MG/24 HOURS) 21 mg/24hr patch Place 1 patch (21 mg total) onto the skin  daily. 08/12/19   Aldean Baker, NP  OLANZapine (ZYPREXA) 10 MG tablet Take 1 tablet (10 mg total) by mouth at bedtime. 08/11/19   Aldean Baker, NP  Oxcarbazepine (TRILEPTAL) 300 MG tablet Take 1 tablet (300 mg total) by mouth 2 (two) times daily. 08/11/19   Aldean Baker, NP     Critical care time: 35 mins     Posey Boyer, MSN, AGACNP-BC [facility] 02/06/2020, 6:35 AM     '

## 2020-02-06 NOTE — Procedures (Signed)
Intubation Procedure Note Donald Carroll [id number] 1966-03-10  Procedure: Intubation Indications: Respiratory insufficiency , extreme agitation not able to keep HFNC   Procedure Details Consent: Risks of procedure as well as the alternatives and risks of each were explained to the (patient/caregiver).  Consent for procedure obtained. Time Out: Verified patient identification, verified procedure, site/side was marked, verified correct patient position, special equipment/implants available, medications/allergies/relevent history reviewed, required imaging and test results available.  Performed  Maximum sterile technique was used including gloves, gown, hand hygiene and mask.  MAC and 3  Ativan 4 mg in divided doses given already Versed 2 Fentanyl 100  Etomidate 20 Rocuronium 50  Grade 1 view   Evaluation Hemodynamic Status: BP stable throughout; O2 sats: transiently fell during during procedure Patient's Current Condition: stable Complications: No apparent complications Patient did tolerate procedure well. Chest X-ray ordered to verify placement.  CXR: pending.   Donald Carroll 02/06/2020

## 2020-02-06 NOTE — Progress Notes (Signed)
Patient becomes very agitated randomly and with stimulation. Does not come to command or emotional support Patients pain medication and sedation have been titrated up to max dose. Call to [facility] to make aware we may need additional sedation/pain med's for patient.

## 2020-02-07 ENCOUNTER — Inpatient Hospital Stay (HOSPITAL_COMMUNITY): Payer: Self-pay

## 2020-02-07 DIAGNOSIS — G9341 Metabolic encephalopathy: Secondary | ICD-10-CM

## 2020-02-07 LAB — HEMOGLOBIN A1C
Hgb A1c MFr Bld: 5.6 % (ref 4.8–5.6)
Mean Plasma Glucose: 114.02 mg/dL

## 2020-02-07 LAB — GLUCOSE, CAPILLARY
Glucose-Capillary: 105 mg/dL — ABNORMAL HIGH (ref 70–99)
Glucose-Capillary: 94 mg/dL (ref 70–99)
Glucose-Capillary: 106 mg/dL — ABNORMAL HIGH (ref 70–99)
Glucose-Capillary: 116 mg/dL — ABNORMAL HIGH (ref 70–99)

## 2020-02-07 LAB — CBC
HCT: 33.7 % — ABNORMAL LOW (ref 39.0–52.0)
Hemoglobin: 11.2 g/dL — ABNORMAL LOW (ref 13.0–17.0)
MCH: 31.8 pg (ref 26.0–34.0)
MCHC: 33.2 g/dL (ref 30.0–36.0)
MCV: 95.7 fL (ref 80.0–100.0)
Platelets: 233 10*3/uL (ref 150–400)
RBC: 3.52 MIL/uL — ABNORMAL LOW (ref 4.22–5.81)
RDW: 14.8 % (ref 11.5–15.5)
WBC: 13 10*3/uL — ABNORMAL HIGH (ref 4.0–10.5)
nRBC: 0 % (ref 0.0–0.2)

## 2020-02-07 LAB — RENAL FUNCTION PANEL
Albumin: 2 g/dL — ABNORMAL LOW (ref 3.5–5.0)
Anion gap: 12 (ref 5–15)
BUN: 25 mg/dL — ABNORMAL HIGH (ref 6–20)
CO2: 31 mmol/L (ref 22–32)
Calcium: 8.7 mg/dL — ABNORMAL LOW (ref 8.9–10.3)
Chloride: 104 mmol/L (ref 98–111)
Creatinine, Ser: 1.07 mg/dL (ref 0.61–1.24)
GFR calc Af Amer: >60 mL/min (ref >60)
GFR calc non Af Amer: >60 mL/min (ref >60)
Glucose, Bld: 101 mg/dL — ABNORMAL HIGH (ref 70–99)
Phosphorus: 1.9 mg/dL — ABNORMAL LOW (ref 2.5–4.6)
Potassium: 3.7 mmol/L (ref 3.5–5.1)
Sodium: 147 mmol/L — ABNORMAL HIGH (ref 135–145)

## 2020-02-07 LAB — CSF CELL COUNT WITH DIFFERENTIAL
RBC Count, CSF: 1 /mm3 — ABNORMAL HIGH
RBC Count, CSF: 1 /mm3 — ABNORMAL HIGH
Tube #: 1
Tube #: 4
WBC, CSF: 1 /mm3 (ref 0–5)
WBC, CSF: 1 /mm3 (ref 0–5)

## 2020-02-07 LAB — MISC LABCORP TEST (SEND OUT): Labcorp test code: 716811

## 2020-02-07 LAB — PROCALCITONIN: Procalcitonin: 3.18 ng/mL

## 2020-02-07 LAB — LEGIONELLA PNEUMOPHILA SEROGP 1 UR AG: L. pneumophila Serogp 1 Ur Ag: NEGATIVE

## 2020-02-07 LAB — PROTEIN AND GLUCOSE, CSF
Glucose, CSF: 66 mg/dL (ref 40–70)
Total  Protein, CSF: 47 mg/dL — ABNORMAL HIGH (ref 15–45)

## 2020-02-07 LAB — ANCA TITERS
Atypical P-ANCA titer: <1:20 {titer}
C-ANCA: <1:20 {titer}
P-ANCA: <1:20 {titer}

## 2020-02-07 LAB — MAGNESIUM: Magnesium: 2 mg/dL (ref 1.7–2.4)

## 2020-02-07 LAB — GABAPENTIN LEVEL: Gabapentin Lvl: 1.5 ug/mL — ABNORMAL LOW (ref 4.0–16.0)

## 2020-02-07 MED ORDER — OXCARBAZEPINE 300 MG/5ML PO SUSP
300.0000 mg | Freq: Two times a day (BID) | ORAL | Status: DC
  Administered 2020-02-07 – 2020-02-10 (×7): 300 mg
  Filled 2020-02-07 (×9): qty 5

## 2020-02-07 MED ORDER — VITAL HIGH PROTEIN PO LIQD: 1000.0000 mL | ORAL | Status: DC

## 2020-02-07 MED ORDER — FENTANYL CITRATE (PF) 100 MCG/2ML IJ SOLN
50.0000 ug | INTRAMUSCULAR | Status: DC | PRN
  Administered 2020-02-08: 50 ug via INTRAVENOUS
  Administered 2020-02-08: 100 ug via INTRAVENOUS
  Administered 2020-02-08: 50 ug via INTRAVENOUS
  Administered 2020-02-08: 100 ug via INTRAVENOUS
  Administered 2020-02-08 (×2): 200 ug via INTRAVENOUS
  Administered 2020-02-08: 100 ug via INTRAVENOUS
  Administered 2020-02-08: 50 ug via INTRAVENOUS
  Administered 2020-02-09: 100 ug via INTRAVENOUS
  Administered 2020-02-09 – 2020-02-10 (×4): 200 ug via INTRAVENOUS
  Administered 2020-02-10: 100 ug via INTRAVENOUS

## 2020-02-07 MED ORDER — VITAL 1.5 CAL PO LIQD
1000.0000 mL | ORAL | Status: DC
  Administered 2020-02-07 – 2020-02-10 (×4): 1000 mL
  Filled 2020-02-07 (×5): qty 1000

## 2020-02-07 MED ORDER — POLYETHYLENE GLYCOL 3350 17 G PO PACK
17.0000 g | PACK | Freq: Every day | ORAL | Status: DC
  Administered 2020-02-07 – 2020-02-09 (×3): 17 g
  Filled 2020-02-07 (×4): qty 1

## 2020-02-07 MED ORDER — FREE WATER
300.0000 mL | Freq: Four times a day (QID) | Status: DC
  Administered 2020-02-07 – 2020-02-09 (×9): 300 mL

## 2020-02-07 MED ORDER — PANTOPRAZOLE SODIUM 40 MG PO PACK
40.0000 mg | PACK | Freq: Every day | ORAL | Status: DC
  Administered 2020-02-07 – 2020-02-09 (×3): 40 mg
  Filled 2020-02-07 (×5): qty 20

## 2020-02-07 MED ORDER — DOCUSATE SODIUM 50 MG/5ML PO LIQD
100.0000 mg | Freq: Two times a day (BID) | ORAL | Status: DC
  Administered 2020-02-07 – 2020-02-10 (×7): 100 mg
  Filled 2020-02-07 (×8): qty 10

## 2020-02-07 MED ORDER — THIAMINE HCL 100 MG/ML IJ SOLN
500.0000 mg | Freq: Every day | INTRAVENOUS | Status: AC
  Administered 2020-02-08 – 2020-02-09 (×2): 500 mg via INTRAVENOUS
  Filled 2020-02-07 (×3): qty 5

## 2020-02-07 MED ORDER — ADULT MULTIVITAMIN LIQUID CH
15.0000 mL | Freq: Every day | ORAL | Status: DC
  Administered 2020-02-07 – 2020-02-09 (×3): 15 mL
  Filled 2020-02-07 (×4): qty 15

## 2020-02-07 MED ORDER — ADULT MULTIVITAMIN W/MINERALS CH: 1.0000 | ORAL_TABLET | Freq: Every day | ORAL | Status: DC

## 2020-02-07 MED ORDER — INSULIN ASPART 100 UNIT/ML ~~LOC~~ SOLN
0.0000 [IU] | SUBCUTANEOUS | Status: DC
  Administered 2020-02-08 – 2020-02-11 (×10): 1 [IU] via SUBCUTANEOUS

## 2020-02-07 MED ORDER — POTASSIUM PHOSPHATES 15 MMOLE/5ML IV SOLN
30.0000 mmol | Freq: Once | INTRAVENOUS | Status: AC
  Administered 2020-02-07: 30 mmol via INTRAVENOUS
  Filled 2020-02-07: qty 10

## 2020-02-07 MED ORDER — THIAMINE HCL 100 MG/ML IJ SOLN
250.0000 mg | Freq: Every day | INTRAVENOUS | Status: DC
  Administered 2020-02-10: 250 mg via INTRAVENOUS
  Filled 2020-02-07 (×2): qty 2.5

## 2020-02-07 MED ORDER — FENTANYL CITRATE (PF) 100 MCG/2ML IJ SOLN: 50.0000 ug | INTRAMUSCULAR | Status: DC | PRN

## 2020-02-07 NOTE — Procedures (Signed)
Lumbar Puncture Procedure Note  Pre-operative Diagnosis: Acute Encephalopathy   Post-operative Diagnosis: Acute Encephalopathy  Indications: Diagnostic  Procedure Details   Consent: Informed consent was obtained. Risks of the procedure were discussed including: infection, bleeding, pain and headache.  The patient was positioned under sterile conditions. Betadine solution and sterile drapes were utilized. A spinal needle was inserted at the L3 - L4 interspace.  Spinal fluid was obtained and sent to the laboratory: Cell count with diff, culture, gram stain, glucose, protein, HSV PCR  Findings 63mL of clear spinal fluid was obtained. Opening Pressure: 20 cm H2O pressure.  Complications:  None; patient tolerated the procedure well.        Condition: stable    Dr. Verdene Lennert Internal Medicine PGY-1  Pager: [phone] 02/07/2020, 4:41 PM

## 2020-02-07 NOTE — Progress Notes (Signed)
Subjective: Continues to be confused  I discussed his substance use with his girlfriend who assures me that he has been sober for about a year.  Exam: Vitals:   02/07/20 0630 02/07/20 0754  BP: 110/76   Pulse:  91  Resp:  18  Temp:  (!) 101 F (38.3 C)  SpO2:  96%   Gen: In bed, intubated Resp: Ventilated Abd: soft, nt  Neck is supple  Neuro: MS: Following reduction of sedation, his eyes open, but he does not fixate or track, does not follow commands.  Eyes are relatively midline. CN: Slight eye deviation on the right compared to the left, pupils are equal round and reactive, corneals are intact Motor: Moves all extremities spontaneously Sensory: Response to noxious stimulation x4  Pertinent Labs: Albumin 2.0  Impression: 54 year old male presenting with altered mental status, renal failure.  Fevers presumably due to aspiration pneumonia, but given that we still do not have a great explanation for his altered mental status, I do think a lumbar puncture is needed.  He is undergoing an MRI at 1 PM, and I would favor waiting until this afternoon to do it.  Recommendations: 1) MRI brain 2) LP for cells, protein, glucose, cultures, hsv by PCR 3) will follow.   Ritta Slot, MD [facility] [phone]  If 7pm- 7am, please page neurology on call as listed in [place].

## 2020-02-07 NOTE — Progress Notes (Signed)
NAME:  Donald Carroll, MRN:  [id number], DOB:  09/14/1966, LOS: 3 ADMISSION DATE:  02/04/2020, CONSULTATION DATE:  02/06/2020 REFERRING MD:  Dr. Antionette Char, CHIEF COMPLAINT:  Hypoxia, AMS  Brief History   79 yoM presenting with 1 day hx of AMS found to be hypotensive and hypoxic with AKI.  Suspected sepsis, started on empiric abx coverage, CXR initially with mild bibasilar changes.  Since ongoing encephalopathy, still requiring NRB with increasing respiratory distress.  CXR now with worsening bilateral infiltrates, PCCM consulted.   History of present illness   HPI obtained from medical chart review as patient is encephalopathic.    54 year old male with prior history of tobacco, ETOH/ substance abuse (heroin), HTN, BPD, PTSD, SI in 10/21 who presented 4/13 with one day history of altered mental status and twitching.  Unclear last ETOH/ substance abuse.  Found hypotensive and hypoxic requiring NRB.    Workup notable for WBC 24, Na 126, K 5.4, BUN 91, sCR 6.35, elevated LFTs, PCT 28.77,  CXR with mild bibasilar infiltrates, SARS 2 neg, BNB 252, CK 1445,  ETOH neg, salicylates, UDS neg.  CT head normal.  Had urinary retention requiring foley insertion.  Nephrology consulted and given more fluids with improving AKI.  Ongoing altered mental status.  On CIWA needing ativan q 2-3 hours.  Worsening PaO2, remains on NRB.  Repeat CXR overnight with progressive diffuse bilateral airspace disease thought to represent worsening pneumonia +/- multifocal pneumonia.  Remains afebrile, blood pressure stable, improving WBC.  Was given 60mg  lasix with good UOP however still requiring NRB and increased work of breathing, therefore PCCM consulted for evaluation.   Past Medical History  Tobacco abuse, ETOH/ substance abuse (heroin), HTN, BPD, PTSD, SI in 10/21  Significant Hospital Events   4/13 Admit  4/15 Intubated   Consults:  Nephrology  Procedures:  4/13: Foley  4/15: ETT  Significant Diagnostic Tests:    4/13 [facility] >> neg  4/13 5/13 abd complete >>  1. Hepatic steatosis. 2. Otherwise unremarkable exam.  EEG 4/15>> Abnormality -Triphasic waves, generalized -Continuous slow, generalized  IMPRESSION: This study is suggestive of moderate diffuse encephalopathy, nonspecific to etiology but could be secondary to toxic-metabolic causes.  No seizures or epileptiform discharges were seen throughout the recording.  Micro Data:  4/13 SARS 2/ Flu A/B >> Negative 4/13 BCx 2 >> NGTD 4/13 UC >> NGTD 4/14 MRSA PCR >> neg 4/15: Respiratory cultures >>  Antimicrobials:  4/13 vancomycin>> 4/13 4/13 ceftriaxone >> 4/14 azithromycin >>  Interim history/subjective:  Febrile to 101.95F Sedated overnight with versed, fentanyl, and precedex. Discontinued this AM for better neuro exam   Objective   Blood pressure 110/76, pulse 91, temperature (!) 101 F (38.3 C), temperature source Axillary, resp. rate 18, height 6' (1.829 m), weight 103.6 kg, SpO2 96 %. CVP:  [8 mmHg-9 mmHg] 8 mmHg  Vent Mode: PRVC FiO2 (%):  [50 %-80 %] 50 % Set Rate:  [18 bmp] 18 bmp Vt Set:  [620 mL] 620 mL PEEP:  [10 cmH20] 10 cmH20 Plateau Pressure:  [20 cmH20-31 cmH20] 30 cmH20   Intake/Output Summary (Last 24 hours) at 02/07/2020 1111 Last data filed at 02/07/2020 0600 Gross per 24 hour  Intake 3602.35 ml  Output 1370 ml  Net 2232.35 ml   Filed Weights   02/05/20 0058  Weight: 103.6 kg   Examination: General: Well nourished male, sedated HENT: Normocephalic, atraumatic, moist mucus membranes Pulm: Good air movement with course breath sounds CV: RRR, no murmurs,  no rubs  Abdomen: Active bowel sounds, soft, non-distended, no tenderness to palpation  Extremities: Pulses palpable in all extremities, no LE edema  Skin: Warm and dry  Neuro: Sedated  Resolved Hospital Problem list   None  Assessment & Plan:   ARDS secondary direct pulmonary insult from aspiration PNA  - Oxygenation significantly improved  overnight  - Urine strep negative. Follow-up urine legionella. Continue ceftriaxone and azithromycin  - Follow-up serology including ANCA, ANA - Currently off sedation. If needed use fentanyl and precedex. RASS goal -1  Acute Encephalopathy, metabolic vs toxic  - Appreciate neurology consultation - Stopped sedation to facilitate better neuro exam - Spot EEG negative  - Continue high dose thiamine  - Follow-up MRI today. May need LP   AKI - Creatinine back to baseline  - Continue to avoid nephrotoxic medication and trend renal function  - Appreciate nephrology consultation   Nutrition  - Start trickle tube feeds with free water replacement  Best practice:  Diet: Trickle tube feeds Pain/Anxiety/Delirium protocol (if indicated): precedex if needed VAP protocol (if indicated): Per protocol DVT prophylaxis: heparin SQ GI prophylaxis: Protonix Glucose control: SSI every 4 hours with tube feeds Mobility: BR Code Status: Full  Family Communication: pending Disposition: tx to ICU   Labs   CBC: Recent Labs  Lab 02/04/20 1813 02/05/20 0544 02/06/20 0327 02/06/20 1212 02/07/20 0446  WBC 24.4* 17.1* 16.6*  --  13.0*  NEUTROABS 22.0*  --   --   --   --   HGB 14.2 12.7* 13.2 12.2* 11.2*  HCT 42.1 37.7* 38.9* 36.0* 33.7*  MCV 95.5 95.0 93.5  --  95.7  PLT 254 229 242  --  233    Basic Metabolic Panel: Recent Labs  Lab 02/04/20 1813 02/04/20 1813 02/05/20 0003 02/05/20 0544 02/05/20 1146 02/06/20 0327 02/06/20 1212 02/07/20 0446  NA 126*   < >  --  132* 134* 142 142 147*  K 5.4*   < >  --  4.9 4.5 4.3 4.2 3.7  CL 89*  --   --  98 99 102  --  104  CO2 17*  --   --  18* 21* 26  --  31  GLUCOSE 106*  --   --  89 91 102*  --  101*  BUN 91*  --   --  84* 70* 34*  --  25*  CREATININE 6.35*  --   --  4.19* 2.78* 1.21  --  1.07  CALCIUM 8.3*  --   --  7.6* 8.0* 8.8*  --  8.7*  MG  --   --  2.7*  --   --  2.2  --  2.0  PHOS  --   --  6.7*  --   --  2.2*  --  1.9*   < > =  values in this interval not displayed.   GFR: Estimated Creatinine Clearance: 101.7 mL/min (by C-G formula based on SCr of 1.07 mg/dL). Recent Labs  Lab 02/04/20 1813 02/04/20 2015 02/05/20 0003 02/05/20 0544 02/06/20 0327 02/07/20 0446  PROCALCITON  --   --  28.77  --   --  3.18  WBC 24.4*  --   --  17.1* 16.6* 13.0*  LATICACIDVEN 1.1 0.8  --   --   --   --     Liver Function Tests: Recent Labs  Lab 02/04/20 1813 02/05/20 0544 02/06/20 0327 02/07/20 0446  AST 103* 75* 62*  --  ALT 63* 54* 54*  --   ALKPHOS 110 94 118  --   BILITOT 1.1 1.1 1.4*  --   PROT 7.8 6.0* 6.7  --   ALBUMIN 3.5 2.4* 2.5* 2.0*   Recent Labs  Lab 02/04/20 1813  LIPASE 14   Recent Labs  Lab 02/04/20 1813  AMMONIA 9    ABG    Component Value Date/Time   PHART 7.438 02/06/2020 1212   PCO2ART 56.8 (H) 02/06/2020 1212   PO2ART 431.0 (H) 02/06/2020 1212   HCO3 38.2 (H) 02/06/2020 1212   TCO2 40 (H) 02/06/2020 1212   ACIDBASEDEF 7.9 (H) 02/05/2020 0622   O2SAT 100.0 02/06/2020 1212     Coagulation Profile: Recent Labs  Lab 02/04/20 1813  INR 1.2    Cardiac Enzymes: Recent Labs  Lab 02/05/20 0544  CKTOTAL 1,445*    HbA1C: Hgb A1c MFr Bld  Date/Time Value Ref Range Status  08/04/2019 06:05 PM 5.1 4.8 - 5.6 % Final    Comment:    (NOTE) Pre diabetes:          5.7%-6.4% Diabetes:              >6.4% Glycemic control for   <7.0% adults with diabetes   07/18/2018 06:22 AM 4.6 (L) 4.8 - 5.6 % Final    Comment:    (NOTE) Pre diabetes:          5.7%-6.4% Diabetes:              >6.4% Glycemic control for   <7.0% adults with diabetes     CBG: Recent Labs  Lab 02/06/20 0714 02/06/20 1150 02/06/20 1602 02/06/20 2013 02/07/20 0421  GLUCAP 102* 137* 128* 106* 105*    Review of Systems:   Unable to obtain due to critical illness.   Past Medical History  He,  has a past medical history of Anxiety, Depression, and Hypertension.   Surgical History   No past  surgical history on file.   Social History   reports that he has been smoking cigarettes. He has been smoking about 1.00 pack per day. He has never used smokeless tobacco. He reports current alcohol use. He reports current drug use. Drug: Heroin.   Family History   His family history includes Hypertension in his father and mother.   Allergies No Known Allergies   Home Medications  Prior to Admission medications   Medication Sig Start Date End Date Taking? Authorizing Provider  acamprosate (CAMPRAL) 333 MG tablet Take 2 tablets (666 mg total) by mouth 3 (three) times daily. 08/11/19   Connye Burkitt, NP  amLODipine (NORVASC) 5 MG tablet Take 1 tablet (5 mg total) by mouth daily. 08/12/19   Connye Burkitt, NP  DULoxetine (CYMBALTA) 30 MG capsule Take 30 mg by mouth daily.    [provider]  DULoxetine (CYMBALTA) 60 MG capsule Take 1 capsule (60 mg total) by mouth daily. Patient not taking: Reported on 02/06/2020 08/12/19   Connye Burkitt, NP  gabapentin (NEURONTIN) 400 MG capsule Take 1 capsule (400 mg total) by mouth 3 (three) times daily. Patient taking differently: Take 400 mg by mouth 4 (four) times daily.  08/11/19   Connye Burkitt, NP  lisinopril (ZESTRIL) 20 MG tablet Take 1 tablet (20 mg total) by mouth daily. 08/12/19   Connye Burkitt, NP  Multiple Vitamin (MULTIVITAMIN WITH MINERALS) TABS tablet Take 1 tablet by mouth daily. 08/12/19   Connye Burkitt, NP  naproxen (NAPROSYN)  500 MG tablet Take 500 mg by mouth 2 (two) times daily with a meal.    [provider]  nicotine (NICODERM CQ - DOSED IN MG/24 HOURS) 21 mg/24hr patch Place 1 patch (21 mg total) onto the skin daily. 08/12/19   Aldean Baker, NP  OLANZapine (ZYPREXA) 10 MG tablet Take 1 tablet (10 mg total) by mouth at bedtime. Patient not taking: Reported on 02/06/2020 08/11/19   Aldean Baker, NP  OLANZapine (ZYPREXA) 15 MG tablet Take 15 mg by mouth at bedtime.    [provider]  Oxcarbazepine  (TRILEPTAL) 300 MG tablet Take 1 tablet (300 mg total) by mouth 2 (two) times daily. 08/11/19   Aldean Baker, NP  traZODone (DESYREL) 100 MG tablet Take 100 mg by mouth at bedtime.    [provider]         Levora Dredge, MD  [facility] PGY3  Pager: [phone]

## 2020-02-07 NOTE — Progress Notes (Signed)
Initial Nutrition Assessment  DOCUMENTATION CODES:   Not applicable  INTERVENTION:   Initiate Vital 1.5 @ 20 ml/hr via OG tube  As able recommend: Increase to goal rate of 60 ml/hr (1440 ml/day) Add 30 ml prostat BID  Provides: 2360 kcal, 127 grams protein, and 1094 ml free water.   300 ml free water every 6 hours Total free water: 2294 ml   NUTRITION DIAGNOSIS:   Inadequate oral intake related to inability to eat as evidenced by NPO status.  GOAL:   Patient will meet greater than or equal to 90% of their needs  MONITOR:   TF tolerance, Vent status  REASON FOR ASSESSMENT:   Consult, Ventilator Enteral/tube feeding initiation and management  ASSESSMENT:   Pt with PMH of ETOH abuse (no recent use), substance abuse, and PTSD who was admitted 4/13 with AMS and dx with ARDS of unknown etiology and AKI.   Per chart review pt is heavily sedated and when weaned pt becomes extremely agitated.  No recent ETOH use and UDS negative on admission  Spoke with significant other at bedside who reports pt recently worked a 2 Engineer, agricultural job and then went to lunch. Shortly after he began having diarrhea and assumed he had food poisoning.  Noted concern for ARDS from aspiration.   4/15 intubated  Patient is currently intubated on ventilator support MV: 11.1 L/min Temp (24hrs), Avg:100.2 F (37.9 C), Min:99.2 F (37.3 C), Max:101 F (38.3 C)  Medications reviewed and include: colace, folic acid, SSI, MVI, miralax, thiamine 30 mmol KPhos x 1 Precedex  Labs reviewed: Na 147 (H), PO4: 1.9 (L) OG tube in place, per xray positioned at pylorus or duodenal bulb  NUTRITION - FOCUSED PHYSICAL EXAM:    Most Recent Value  Orbital Region  No depletion  Upper Arm Region  No depletion  Thoracic and Lumbar Region  No depletion  Buccal Region  No depletion  Temple Region  No depletion  Clavicle Bone Region  No depletion  Clavicle and Acromion Bone Region  No depletion   Scapular Bone Region  No depletion  Dorsal Hand  No depletion  Patellar Region  No depletion  Anterior Thigh Region  No depletion  Posterior Calf Region  No depletion  Edema (RD Assessment)  None  Hair  Reviewed  Eyes  Unable to assess  Mouth  Unable to assess  Skin  Reviewed  Nails  Reviewed       Diet Order:   Diet Order            Diet NPO time specified Except for: Sips with Meds  Diet effective now              EDUCATION NEEDS:   No education needs have been identified at this time  Skin:  Skin Assessment: Reviewed RN Assessment  Last BM:  4/14  Height:   Ht Readings from Last 1 Encounters:  02/05/20 6' (1.829 m)    Weight:   Wt Readings from Last 1 Encounters:  02/05/20 103.6 kg    Ideal Body Weight:  80.9 kg  BMI:  Body mass index is 30.98 kg/m.  Estimated Nutritional Needs:   Kcal:  2383  Protein:  125-140 grams  Fluid:  > 2 L/day  Cammy Copa., RD, LDN, CNSC See AMiON for contact information

## 2020-02-08 ENCOUNTER — Inpatient Hospital Stay (HOSPITAL_COMMUNITY): Payer: Self-pay

## 2020-02-08 DIAGNOSIS — N179 Acute kidney failure, unspecified: Secondary | ICD-10-CM

## 2020-02-08 LAB — GLUCOSE, CAPILLARY
Glucose-Capillary: 101 mg/dL — ABNORMAL HIGH (ref 70–99)
Glucose-Capillary: 111 mg/dL — ABNORMAL HIGH (ref 70–99)
Glucose-Capillary: 116 mg/dL — ABNORMAL HIGH (ref 70–99)
Glucose-Capillary: 125 mg/dL — ABNORMAL HIGH (ref 70–99)
Glucose-Capillary: 132 mg/dL — ABNORMAL HIGH (ref 70–99)
Glucose-Capillary: 132 mg/dL — ABNORMAL HIGH (ref 70–99)

## 2020-02-08 LAB — BASIC METABOLIC PANEL
Anion gap: 8 (ref 5–15)
BUN: 20 mg/dL (ref 6–20)
CO2: 28 mmol/L (ref 22–32)
Calcium: 8.1 mg/dL — ABNORMAL LOW (ref 8.9–10.3)
Chloride: 109 mmol/L (ref 98–111)
Creatinine, Ser: 1 mg/dL (ref 0.61–1.24)
GFR calc Af Amer: >60 mL/min (ref >60)
GFR calc non Af Amer: >60 mL/min (ref >60)
Glucose, Bld: 114 mg/dL — ABNORMAL HIGH (ref 70–99)
Potassium: 3.9 mmol/L (ref 3.5–5.1)
Sodium: 145 mmol/L (ref 135–145)

## 2020-02-08 LAB — CBC
HCT: 33.3 % — ABNORMAL LOW (ref 39.0–52.0)
Hemoglobin: 10.8 g/dL — ABNORMAL LOW (ref 13.0–17.0)
MCH: 31.3 pg (ref 26.0–34.0)
MCHC: 32.4 g/dL (ref 30.0–36.0)
MCV: 96.5 fL (ref 80.0–100.0)
Platelets: 226 10*3/uL (ref 150–400)
RBC: 3.45 MIL/uL — ABNORMAL LOW (ref 4.22–5.81)
RDW: 15.6 % — ABNORMAL HIGH (ref 11.5–15.5)
WBC: 13.8 10*3/uL — ABNORMAL HIGH (ref 4.0–10.5)
nRBC: 0 % (ref 0.0–0.2)

## 2020-02-08 LAB — MAGNESIUM: Magnesium: 2 mg/dL (ref 1.7–2.4)

## 2020-02-08 LAB — TSH: TSH: 0.589 u[IU]/mL (ref 0.350–4.500)

## 2020-02-08 LAB — PROCALCITONIN: Procalcitonin: 2.01 ng/mL

## 2020-02-08 LAB — PHOSPHORUS: Phosphorus: 4.6 mg/dL (ref 2.5–4.6)

## 2020-02-08 MED ORDER — FUROSEMIDE 10 MG/ML IJ SOLN
40.0000 mg | Freq: Once | INTRAMUSCULAR | Status: AC
  Administered 2020-02-08: 15:00:00 40 mg via INTRAVENOUS
  Filled 2020-02-08: qty 4

## 2020-02-08 MED ORDER — MIDAZOLAM HCL 2 MG/2ML IJ SOLN
1.0000 mg | INTRAMUSCULAR | Status: DC | PRN
  Administered 2020-02-08 – 2020-02-10 (×12): 2 mg via INTRAVENOUS
  Filled 2020-02-08 (×12): qty 2

## 2020-02-08 MED ORDER — FUROSEMIDE 10 MG/ML IJ SOLN
40.0000 mg | Freq: Once | INTRAMUSCULAR | Status: AC
  Administered 2020-02-08: 09:00:00 40 mg via INTRAVENOUS
  Filled 2020-02-08: qty 4

## 2020-02-08 NOTE — Progress Notes (Signed)
Subjective: No significant changes  Exam: Vitals:   02/08/20 1126 02/08/20 1154  BP:    Pulse:    Resp:    Temp:  99.1 F (37.3 C)  SpO2: 100%    Gen: In bed, intubated Resp: Ventilated Abd: soft, nt  Neck is supple  Neuro: MS:  his eyes open, but he does not fixate or track, does not follow commands.  Eyes are relatively midline. KT:GYBW coinjugate  With arousal. pupils are equal round and reactive, corneals are intact Motor: Moves all extremities spontaneously Sensory: Response to noxious stimulation x4  Pertinent Labs: Albumin 2.0 TSH  Ammonia 9 UDS negative CK on admission 1445 Gabapentin level, 1.5(4 - 16)  CSF RBC 1 CSF WBC 1 CSF protein 47 CSF glucose 66  MRI and spot EEG without explanation.  Impression: 54 year old male presenting with altered mental status, renal failure.  Currently, I do not have a clear explanation for his encephalopathy.  He is on high-dose thiamine, given low albumin I do think nutritional status is a consideration.  Though he had been reported as sober, he had a relapse of least as recent as October, and I think that alcohol withdrawal/delirium tremens is still a significant consideration.  Recommendations: 1) continue supportive care 2) will follow.   Ritta Slot, MD [facility] [phone]  If 7pm- 7am, please page neurology on call as listed in AMION.

## 2020-02-08 NOTE — Progress Notes (Signed)
NAME:  Donald Carroll, MRN:  [id number], DOB:  1966-03-16, LOS: 4 ADMISSION DATE:  02/04/2020, CONSULTATION DATE:  02/06/2020 REFERRING MD:  Dr. Myna Hidalgo, CHIEF COMPLAINT:  Hypoxia, AMS  Brief History   5 yoM presenting with 1 day hx of AMS found to be hypotensive and hypoxic with AKI.  Suspected sepsis, started on empiric abx coverage, CXR initially with mild bibasilar changes.  Since ongoing encephalopathy, still requiring NRB with increasing respiratory distress.  CXR now with worsening bilateral infiltrates, PCCM consulted.   History of present illness   HPI obtained from medical chart review as patient is encephalopathic.    54 year old male with prior history of tobacco, ETOH/ substance abuse (heroin), HTN, BPD, PTSD, SI in 10/21 who presented 4/13 with one day history of altered mental status and twitching.  Unclear last ETOH/ substance abuse.  Found hypotensive and hypoxic requiring NRB.    Workup notable for WBC 24, Na 126, K 5.4, BUN 91, sCR 6.35, elevated LFTs, PCT 28.77,  CXR with mild bibasilar infiltrates, SARS 2 neg, BNB 252, CK 2952,  ETOH neg, salicylates, UDS neg.  CT head normal.  Had urinary retention requiring foley insertion.  Nephrology consulted and given more fluids with improving AKI.  Ongoing altered mental status.  On CIWA needing ativan q 2-3 hours.  Worsening PaO2, remains on NRB.  Repeat CXR overnight with progressive diffuse bilateral airspace disease thought to represent worsening pneumonia +/- multifocal pneumonia.  Remains afebrile, blood pressure stable, improving WBC.  Was given 60mg  lasix with good UOP however still requiring NRB and increased work of breathing, therefore PCCM consulted for evaluation.   Past Medical History  Tobacco abuse, ETOH/ substance abuse (heroin), HTN, BPD, PTSD, SI in 10/21  Significant Hospital Events   4/13 Admit  4/15 Intubated   Consults:  Nephrology  Procedures:  4/13: Foley  4/15: ETT  Significant Diagnostic Tests:   4/13 [facility] >> neg  4/13 [place] abd complete >>  1. Hepatic steatosis. 2. Otherwise unremarkable exam.  EEG 4/15>> Abnormality -Triphasic waves, generalized -Continuous slow, generalized  IMPRESSION: This study is suggestive of moderate diffuse encephalopathy, nonspecific to etiology but could be secondary to toxic-metabolic causes.  No seizures or epileptiform discharges were seen throughout the recording.  MR Brain: 4/6 IMPRESSION: - Unremarkable non-contrast MRI appearance of the brain. No evidence of acute intracranial abnormality. - Paranasal sinus mucosal thickening with bilateral maxillary and sphenoid sinus air-fluid levels. Correlate for acute sinusitis. - Bilateral mastoid effusions.  Micro Data:  4/13 SARS 2/ Flu A/B >> Negative 4/13 BCx 2 >> NGTD 4/13 UC >> NGTD 4/14 MRSA PCR >> neg 4/15: Respiratory cultures >> Rare GPCs and Yeast  Antimicrobials:  4/13 vancomycin>> 4/13 4/13 ceftriaxone >> 4/14 azithromycin >>  Interim history/subjective:  Febrile o/n, imprving this AM. Sedated overnight with versed, fentanyl, and precedex. Reaching for tube when weaned.  Objective   Blood pressure 119/78, pulse 69, temperature 98.4 F (36.9 C), temperature source Oral, resp. rate 18, height 6' (1.829 m), weight 94.9 kg, SpO2 100 %.    Vent Mode: PRVC FiO2 (%):  [40 %-50 %] 40 % Set Rate:  [18 bmp] 18 bmp Vt Set:  [620 mL] 620 mL PEEP:  [10 cmH20] 10 cmH20 Plateau Pressure:  [24 cmH20-29 cmH20] 28 cmH20   Intake/Output Summary (Last 24 hours) at 02/08/2020 0833 Last data filed at 02/08/2020 0800 Gross per 24 hour  Intake 3856.09 ml  Output 1245 ml  Net 2611.09 ml  Filed Weights   02/05/20 0058 02/08/20 0500  Weight: 103.6 kg 94.9 kg   Examination: General: Well nourished male, sedated HENT: Normocephalic, atraumatic, moist mucus membranes Pulm: Good air movement with course breath sounds CV: RRR, no murmurs, no rubs  Abdomen: Active bowel sounds, soft,  non-distended, no tenderness to palpation  Extremities: Pulses palpable in all extremities, no LE edema  Skin: Warm and dry  Neuro: Sedated  Resolved Hospital Problem list   AKI  Assessment & Plan:   ARDS 2/2 Bilateral PNA believed to be CAP vs Aspiration  - Oxygenation improving - Remains on MV with minimal vent settings (40%, PEEP 10)  - Urine strep and Leginella negative - Continue ceftriaxone and azithromycin (total of 7d given recent fever spikes) - Sedation, Fentanyl, Precedex, PRN Versed. RASS goal 0 to -1 - Volume Up: Trial of Lasix 40 IV, will do second dose pending response  Acute Encephalopathy, metabolic vs toxic - Neurology following - Spot EEG negative  - CT Head and MR Brain w/o Acute Abnormality - LP Cell Ct normal, No growth, HSV pending - Continue thiamine - D/C continuous versded  Hypernatremia - Improved with Free H2O - 1 More day of free water  Best practice:  Diet: Trickle tube feeds Pain/Anxiety/Delirium protocol (if indicated): precedex if needed VAP protocol (if indicated): Per protocol DVT prophylaxis: heparin SQ GI prophylaxis: Protonix Glucose control: SSI every 4 hours with tube feeds Mobility: BR Code Status: Full  Family Communication: Will call family Disposition: tx to ICU   Labs   CBC: Recent Labs  Lab 02/04/20 1813 02/04/20 1813 02/05/20 0544 02/06/20 0327 02/06/20 1212 02/07/20 0446 02/08/20 0436  WBC 24.4*  --  17.1* 16.6*  --  13.0* 13.8*  NEUTROABS 22.0*  --   --   --   --   --   --   HGB 14.2   < > 12.7* 13.2 12.2* 11.2* 10.8*  HCT 42.1   < > 37.7* 38.9* 36.0* 33.7* 33.3*  MCV 95.5  --  95.0 93.5  --  95.7 96.5  PLT 254  --  229 242  --  233 226   < > = values in this interval not displayed.    Basic Metabolic Panel: Recent Labs  Lab 02/04/20 1813 02/05/20 0003 02/05/20 0544 02/05/20 0544 02/05/20 1146 02/06/20 0327 02/06/20 1212 02/07/20 0446 02/08/20 0436  NA   < >  --  132*   < > 134* 142 142 147* 145   K   < >  --  4.9   < > 4.5 4.3 4.2 3.7 3.9  CL   < >  --  98  --  99 102  --  104 109  CO2   < >  --  18*  --  21* 26  --  31 28  GLUCOSE   < >  --  89  --  91 102*  --  101* 114*  BUN   < >  --  84*  --  70* 34*  --  25* 20  CREATININE   < >  --  4.19*  --  2.78* 1.21  --  1.07 1.00  CALCIUM   < >  --  7.6*  --  8.0* 8.8*  --  8.7* 8.1*  MG  --  2.7*  --   --   --  2.2  --  2.0 2.0  PHOS  --  6.7*  --   --   --  2.2*  --  1.9* 4.6   < > = values in this interval not displayed.   GFR: Estimated Creatinine Clearance: 104.5 mL/min (by C-G formula based on SCr of 1 mg/dL). Recent Labs  Lab 02/04/20 1813 02/04/20 1813 02/04/20 2015 02/05/20 0003 02/05/20 0544 02/06/20 0327 02/07/20 0446 02/08/20 0436  PROCALCITON  --   --   --  28.77  --   --  3.18 2.01  WBC 24.4*   < >  --   --  17.1* 16.6* 13.0* 13.8*  LATICACIDVEN 1.1  --  0.8  --   --   --   --   --    < > = values in this interval not displayed.    Liver Function Tests: Recent Labs  Lab 02/04/20 1813 02/05/20 0544 02/06/20 0327 02/07/20 0446  AST 103* 75* 62*  --   ALT 63* 54* 54*  --   ALKPHOS 110 94 118  --   BILITOT 1.1 1.1 1.4*  --   PROT 7.8 6.0* 6.7  --   ALBUMIN 3.5 2.4* 2.5* 2.0*   Recent Labs  Lab 02/04/20 1813  LIPASE 14   Recent Labs  Lab 02/04/20 1813  AMMONIA 9    ABG    Component Value Date/Time   PHART 7.438 02/06/2020 1212   PCO2ART 56.8 (H) 02/06/2020 1212   PO2ART 431.0 (H) 02/06/2020 1212   HCO3 38.2 (H) 02/06/2020 1212   TCO2 40 (H) 02/06/2020 1212   ACIDBASEDEF 7.9 (H) 02/05/2020 0622   O2SAT 100.0 02/06/2020 1212     Coagulation Profile: Recent Labs  Lab 02/04/20 1813  INR 1.2    Cardiac Enzymes: Recent Labs  Lab 02/05/20 0544  CKTOTAL 1,445*    HbA1C: Hgb A1c MFr Bld  Date/Time Value Ref Range Status  02/07/2020 04:46 AM 5.6 4.8 - 5.6 % Final    Comment:    (NOTE) Pre diabetes:          5.7%-6.4% Diabetes:              >6.4% Glycemic control for   <7.0%  adults with diabetes   08/04/2019 06:05 PM 5.1 4.8 - 5.6 % Final    Comment:    (NOTE) Pre diabetes:          5.7%-6.4% Diabetes:              >6.4% Glycemic control for   <7.0% adults with diabetes     CBG: Recent Labs  Lab 02/07/20 1606 02/07/20 2012 02/08/20 0011 02/08/20 0411 02/08/20 0755  GLUCAP 106* 116* 132* 116* 111*    Review of Systems:   Unable to obtain due to critical illness.   Past Medical History  He,  has a past medical history of Anxiety, Depression, and Hypertension.   Surgical History   No past surgical history on file.   Social History   reports that he has been smoking cigarettes. He has been smoking about 1.00 pack per day. He has never used smokeless tobacco. He reports current alcohol use. He reports current drug use. Drug: Heroin.   Family History   His family history includes Hypertension in his father and mother.   Allergies No Known Allergies   Home Medications  Prior to Admission medications   Medication Sig Start Date End Date Taking? Authorizing Provider  acamprosate (CAMPRAL) 333 MG tablet Take 2 tablets (666 mg total) by mouth 3 (three) times daily. 08/11/19   Aldean Baker, NP  amLODipine (  NORVASC) 5 MG tablet Take 1 tablet (5 mg total) by mouth daily. 08/12/19   Aldean Baker, NP  DULoxetine (CYMBALTA) 30 MG capsule Take 30 mg by mouth daily.    [provider]  DULoxetine (CYMBALTA) 60 MG capsule Take 1 capsule (60 mg total) by mouth daily. Patient not taking: Reported on 02/06/2020 08/12/19   Aldean Baker, NP  gabapentin (NEURONTIN) 400 MG capsule Take 1 capsule (400 mg total) by mouth 3 (three) times daily. Patient taking differently: Take 400 mg by mouth 4 (four) times daily.  08/11/19   Aldean Baker, NP  lisinopril (ZESTRIL) 20 MG tablet Take 1 tablet (20 mg total) by mouth daily. 08/12/19   Aldean Baker, NP  Multiple Vitamin (MULTIVITAMIN WITH MINERALS) TABS tablet Take 1 tablet by mouth daily. 08/12/19    Aldean Baker, NP  naproxen (NAPROSYN) 500 MG tablet Take 500 mg by mouth 2 (two) times daily with a meal.    [provider]  nicotine (NICODERM CQ - DOSED IN MG/24 HOURS) 21 mg/24hr patch Place 1 patch (21 mg total) onto the skin daily. 08/12/19   Aldean Baker, NP  OLANZapine (ZYPREXA) 10 MG tablet Take 1 tablet (10 mg total) by mouth at bedtime. Patient not taking: Reported on 02/06/2020 08/11/19   Aldean Baker, NP  OLANZapine (ZYPREXA) 15 MG tablet Take 15 mg by mouth at bedtime.    [provider]  Oxcarbazepine (TRILEPTAL) 300 MG tablet Take 1 tablet (300 mg total) by mouth 2 (two) times daily. 08/11/19   Aldean Baker, NP  traZODone (DESYREL) 100 MG tablet Take 100 mg by mouth at bedtime.    [provider]         Ginette Otto, DO IM PGY-3 Pager: [phone]

## 2020-02-09 ENCOUNTER — Inpatient Hospital Stay (HOSPITAL_COMMUNITY): Payer: Self-pay

## 2020-02-09 DIAGNOSIS — G9340 Encephalopathy, unspecified: Secondary | ICD-10-CM

## 2020-02-09 LAB — BASIC METABOLIC PANEL
Anion gap: 7 (ref 5–15)
Anion gap: 10 (ref 5–15)
BUN: 23 mg/dL — ABNORMAL HIGH (ref 6–20)
BUN: 26 mg/dL — ABNORMAL HIGH (ref 6–20)
CO2: 25 mmol/L (ref 22–32)
CO2: 27 mmol/L (ref 22–32)
Calcium: 7.1 mg/dL — ABNORMAL LOW (ref 8.9–10.3)
Calcium: 8.1 mg/dL — ABNORMAL LOW (ref 8.9–10.3)
Chloride: 115 mmol/L — ABNORMAL HIGH (ref 98–111)
Chloride: 111 mmol/L (ref 98–111)
Creatinine, Ser: 0.83 mg/dL (ref 0.61–1.24)
Creatinine, Ser: 1.03 mg/dL (ref 0.61–1.24)
GFR calc Af Amer: >60 mL/min (ref >60)
GFR calc Af Amer: >60 mL/min (ref >60)
GFR calc non Af Amer: >60 mL/min (ref >60)
GFR calc non Af Amer: >60 mL/min (ref >60)
Glucose, Bld: 123 mg/dL — ABNORMAL HIGH (ref 70–99)
Glucose, Bld: 118 mg/dL — ABNORMAL HIGH (ref 70–99)
Potassium: 3.1 mmol/L — ABNORMAL LOW (ref 3.5–5.1)
Potassium: 4.3 mmol/L (ref 3.5–5.1)
Sodium: 147 mmol/L — ABNORMAL HIGH (ref 135–145)
Sodium: 148 mmol/L — ABNORMAL HIGH (ref 135–145)

## 2020-02-09 LAB — CBC
HCT: 32.5 % — ABNORMAL LOW (ref 39.0–52.0)
Hemoglobin: 10.6 g/dL — ABNORMAL LOW (ref 13.0–17.0)
MCH: 31.8 pg (ref 26.0–34.0)
MCHC: 32.6 g/dL (ref 30.0–36.0)
MCV: 97.6 fL (ref 80.0–100.0)
Platelets: 205 10*3/uL (ref 150–400)
RBC: 3.33 MIL/uL — ABNORMAL LOW (ref 4.22–5.81)
RDW: 15.4 % (ref 11.5–15.5)
WBC: 16.6 10*3/uL — ABNORMAL HIGH (ref 4.0–10.5)
nRBC: 0 % (ref 0.0–0.2)

## 2020-02-09 LAB — HEPATIC FUNCTION PANEL
ALT: 37 U/L (ref 0–44)
AST: 31 U/L (ref 15–41)
Albumin: 1.7 g/dL — ABNORMAL LOW (ref 3.5–5.0)
Alkaline Phosphatase: 75 U/L (ref 38–126)
Bilirubin, Direct: 0.3 mg/dL — ABNORMAL HIGH (ref 0.0–0.2)
Indirect Bilirubin: 0.7 mg/dL (ref 0.3–0.9)
Total Bilirubin: 1 mg/dL (ref 0.3–1.2)
Total Protein: 4.8 g/dL — ABNORMAL LOW (ref 6.5–8.1)

## 2020-02-09 LAB — GLUCOSE, CAPILLARY
Glucose-Capillary: 102 mg/dL — ABNORMAL HIGH (ref 70–99)
Glucose-Capillary: 115 mg/dL — ABNORMAL HIGH (ref 70–99)
Glucose-Capillary: 121 mg/dL — ABNORMAL HIGH (ref 70–99)
Glucose-Capillary: 123 mg/dL — ABNORMAL HIGH (ref 70–99)
Glucose-Capillary: 127 mg/dL — ABNORMAL HIGH (ref 70–99)
Glucose-Capillary: 131 mg/dL — ABNORMAL HIGH (ref 70–99)

## 2020-02-09 LAB — MPO/PR-3 (ANCA) ANTIBODIES
ANCA Proteinase 3: <3.5 U/mL (ref 0.0–3.5)
Myeloperoxidase Abs: <9 U/mL (ref 0.0–9.0)

## 2020-02-09 LAB — CK: Total CK: 148 U/L (ref 49–397)

## 2020-02-09 LAB — HSV DNA BY PCR (REFERENCE LAB)
HSV 1 DNA: NEGATIVE
HSV 2 DNA: NEGATIVE

## 2020-02-09 LAB — MAGNESIUM: Magnesium: 1.6 mg/dL — ABNORMAL LOW (ref 1.7–2.4)

## 2020-02-09 LAB — VITAMIN B12: Vitamin B-12: 2091 pg/mL — ABNORMAL HIGH (ref 180–914)

## 2020-02-09 MED ORDER — POTASSIUM CHLORIDE 20 MEQ/15ML (10%) PO SOLN
40.0000 meq | Freq: Once | ORAL | Status: AC
  Administered 2020-02-09: 12:00:00 40 meq
  Filled 2020-02-09: qty 30

## 2020-02-09 MED ORDER — FUROSEMIDE 10 MG/ML IJ SOLN
40.0000 mg | Freq: Once | INTRAMUSCULAR | Status: AC
  Administered 2020-02-09: 40 mg via INTRAVENOUS
  Filled 2020-02-09: qty 4

## 2020-02-09 MED ORDER — OXYCODONE-ACETAMINOPHEN 5-325 MG PO TABS: 1.0000 | ORAL_TABLET | Freq: Four times a day (QID) | ORAL | Status: DC

## 2020-02-09 MED ORDER — FUROSEMIDE 10 MG/ML IJ SOLN
40.0000 mg | Freq: Once | INTRAMUSCULAR | Status: AC
  Administered 2020-02-09: 09:00:00 40 mg via INTRAVENOUS
  Filled 2020-02-09: qty 4

## 2020-02-09 MED ORDER — ACETAMINOPHEN 325 MG PO TABS
650.0000 mg | ORAL_TABLET | Freq: Four times a day (QID) | ORAL | Status: DC | PRN
  Administered 2020-02-09: 650 mg via ORAL
  Filled 2020-02-09 (×2): qty 2

## 2020-02-09 MED ORDER — QUETIAPINE FUMARATE 50 MG PO TABS
100.0000 mg | ORAL_TABLET | Freq: Every day | ORAL | Status: DC
  Administered 2020-02-09 – 2020-02-10 (×2): 100 mg
  Filled 2020-02-09 (×2): qty 2

## 2020-02-09 MED ORDER — MAGNESIUM SULFATE 2 GM/50ML IV SOLN
2.0000 g | Freq: Once | INTRAVENOUS | Status: AC
  Administered 2020-02-09: 2 g via INTRAVENOUS
  Filled 2020-02-09: qty 50

## 2020-02-09 MED ORDER — QUETIAPINE FUMARATE 50 MG PO TABS: 100.0000 mg | ORAL_TABLET | Freq: Every day | ORAL | Status: DC

## 2020-02-09 MED ORDER — FREE WATER
300.0000 mL | Status: DC
  Administered 2020-02-09 – 2020-02-11 (×9): 300 mL

## 2020-02-09 MED ORDER — CLONIDINE HCL 0.3 MG PO TABS: 0.3000 mg | ORAL_TABLET | Freq: Four times a day (QID) | ORAL | Status: DC

## 2020-02-09 MED ORDER — POTASSIUM CHLORIDE 20 MEQ PO PACK
40.0000 meq | PACK | Freq: Two times a day (BID) | ORAL | Status: DC
  Administered 2020-02-09: 09:00:00 40 meq via ORAL
  Filled 2020-02-09: qty 2

## 2020-02-09 MED ORDER — CLONIDINE HCL 0.3 MG PO TABS
0.3000 mg | ORAL_TABLET | Freq: Four times a day (QID) | ORAL | Status: DC
  Administered 2020-02-09 – 2020-02-11 (×7): 0.3 mg
  Filled 2020-02-09 (×7): qty 1

## 2020-02-09 NOTE — Progress Notes (Signed)
LTM EEG hooked up and running - no initial skin breakdown - push button tested   

## 2020-02-09 NOTE — Progress Notes (Signed)
NAME:  Donald Carroll, MRN:  [id number], DOB:  [birth year], LOS: 5 ADMISSION DATE:  02/04/2020, CONSULTATION DATE:  02/06/2020 REFERRING MD:  Dr. Antionette Char, CHIEF COMPLAINT:  Hypoxia, AMS  Brief History   5 yoM presenting with 1 day hx of AMS found to be hypotensive and hypoxic with AKI.  Suspected sepsis, started on empiric abx coverage, CXR initially with mild bibasilar changes.  Since ongoing encephalopathy, still requiring NRB with increasing respiratory distress.  CXR now with worsening bilateral infiltrates, PCCM consulted.   History of present illness   HPI obtained from medical chart review as patient is encephalopathic.    54 year old male with prior history of tobacco, ETOH/ substance abuse (heroin), HTN, BPD, PTSD, SI in 10/21 who presented 4/13 with one day history of altered mental status and twitching.  Unclear last ETOH/ substance abuse.  Found hypotensive and hypoxic requiring NRB.    Workup notable for WBC 24, Na 126, K 5.4, BUN 91, sCR 6.35, elevated LFTs, PCT 28.77,  CXR with mild bibasilar infiltrates, SARS 2 neg, BNB 252, CK 1445,  ETOH neg, salicylates, UDS neg.  CT head normal.  Had urinary retention requiring foley insertion.  Nephrology consulted and given more fluids with improving AKI.  Ongoing altered mental status.  On CIWA needing ativan q 2-3 hours.  Worsening PaO2, remains on NRB.  Repeat CXR overnight with progressive diffuse bilateral airspace disease thought to represent worsening pneumonia +/- multifocal pneumonia.  Remains afebrile, blood pressure stable, improving WBC.  Was given 60mg  lasix with good UOP however still requiring NRB and increased work of breathing, therefore PCCM consulted for evaluation.   Past Medical History  Tobacco abuse, ETOH/ substance abuse (heroin), HTN, BPD, PTSD, SI in 10/21  Significant Hospital Events   4/13 Admit  4/15 Intubated   Consults:  Nephrology  Procedures:  4/13: Foley  4/15: ETT  Significant Diagnostic Tests:    4/13 [facility] >> neg  4/13 5/13 abd complete >>  1. Hepatic steatosis. 2. Otherwise unremarkable exam.  EEG 4/15>> Abnormality -Triphasic waves, generalized -Continuous slow, generalized  IMPRESSION: This study is suggestive of moderate diffuse encephalopathy, nonspecific to etiology but could be secondary to toxic-metabolic causes.  No seizures or epileptiform discharges were seen throughout the recording.  MR Brain: 4/6 IMPRESSION: - Unremarkable non-contrast MRI appearance of the brain. No evidence of acute intracranial abnormality. - Paranasal sinus mucosal thickening with bilateral maxillary and sphenoid sinus air-fluid levels. Correlate for acute sinusitis. - Bilateral mastoid effusions.  Micro Data:  4/13 SARS 2/ Flu A/B >> Negative 4/13 BCx 2 >> NGTD 4/13 UC >> NGTD 4/14 MRSA PCR >> neg 4/15: Respiratory cultures >> Rare GPCs and Yeast  Antimicrobials:  4/13 vancomycin>> 4/13 4/13 ceftriaxone >> 4/14 azithromycin >>  Interim history/subjective:  Did spike another fever overnight ~101. Sedated this AM on maximal fentanyl and Precedex due to agitation.   Objective   Blood pressure 108/90, pulse 82, temperature 99.9 F (37.7 C), temperature source Oral, resp. rate (!) 29, height 6' (1.829 m), weight 95 kg, SpO2 99 %.    Vent Mode: PRVC FiO2 (%):  [40 %-60 %] 40 % Set Rate:  [18 bmp] 18 bmp Vt Set:  [620 mL] 620 mL PEEP:  [8 cmH20-10 cmH20] 8 cmH20 Plateau Pressure:  [22 cmH20-28 cmH20] 26 cmH20   Intake/Output Summary (Last 24 hours) at 02/09/2020 0742 Last data filed at 02/09/2020 0600 Gross per 24 hour  Intake 2263 ml  Output 2830 ml  Net -  567 ml   Filed Weights   02/05/20 0058 02/08/20 0500 02/09/20 0500  Weight: 103.6 kg 94.9 kg 95 kg   Examination: General: Well nourished male, sedated HENT: Normocephalic, atraumatic, moist mucus membranes Pulm: Good air movement with course breath sounds CV: RRR, systolic mumur, no rubs  Abdomen: Active bowel sounds,  soft, non-distended, no tenderness to palpation  Extremities: Pulses palpable, no LE edema  Skin: Warm and dry  Neuro: [facility] Problem list   AKI  Assessment & Plan:   ARDS 2/2 Bilateral PNA believed to be CAP vs Aspiration  - Oxygenation improving - Remains on MV with minimal vent settings (40%, PEEP 8)  - Urine strep and Leginella negative - Continue ceftriaxone and azithromycin (total of 7d given recent fever spikes) - Sedation, Fentanyl, Precedex, PRN Versed. RASS goal 0 to -1 - Net -500cc yesterday, CVP 12 today, will continue diuresis  Acute Encephalopathy, metabolic vs toxic: Remains agitated when sedation is weaned - Neurology following - Spot EEG negative  - Work up negative for toxic etiology - CT Head and MR Brain w/o Acute Abnormality - LP Cell Ct normal, No growth, HSV pending - Continue thiamine  Hypernatremia:  - Again elevated to 147, will continue free water  Depression, PTSD, ?Bipolar - Continue Home trileptal  Best practice:  Diet: TFs Pain/Anxiety/Delirium protocol (if indicated): precedex if needed VAP protocol (if indicated): Per protocol DVT prophylaxis: heparin SQ GI prophylaxis: Protonix Glucose control: SSI every 4 hours with tube feeds Mobility: BR Code Status: Full  Family Communication: Will call family, unable to reach on 4/17 Disposition: ICU   Labs   CBC: Recent Labs  Lab 02/04/20 1813 02/04/20 1813 02/05/20 0544 02/05/20 0544 02/06/20 0327 02/06/20 1212 02/07/20 0446 02/08/20 0436 02/09/20 0501  WBC 24.4*   < > 17.1*  --  16.6*  --  13.0* 13.8* 16.6*  NEUTROABS 22.0*  --   --   --   --   --   --   --   --   HGB 14.2   < > 12.7*   < > 13.2 12.2* 11.2* 10.8* 10.6*  HCT 42.1   < > 37.7*   < > 38.9* 36.0* 33.7* 33.3* 32.5*  MCV 95.5   < > 95.0  --  93.5  --  95.7 96.5 97.6  PLT 254   < > 229  --  242  --  233 226 205   < > = values in this interval not displayed.    Basic Metabolic Panel: Recent Labs    Lab 02/05/20 0003 02/05/20 0544 02/05/20 1146 02/05/20 1146 02/06/20 0327 02/06/20 1212 02/07/20 0446 02/08/20 0436 02/09/20 0501  NA  --    < > 134*   < > 142 142 147* 145 147*  K  --    < > 4.5   < > 4.3 4.2 3.7 3.9 3.1*  CL  --    < > 99  --  102  --  104 109 115*  CO2  --    < > 21*  --  26  --  31 28 25   GLUCOSE  --    < > 91  --  102*  --  101* 114* 123*  BUN  --    < > 70*  --  34*  --  25* 20 23*  CREATININE  --    < > 2.78*  --  1.21  --  1.07 1.00 0.83  CALCIUM  --    < > 8.0*  --  8.8*  --  8.7* 8.1* 7.1*  MG 2.7*  --   --   --  2.2  --  2.0 2.0 1.6*  PHOS 6.7*  --   --   --  2.2*  --  1.9* 4.6  --    < > = values in this interval not displayed.   GFR: Estimated Creatinine Clearance: 126 mL/min (by C-G formula based on SCr of 0.83 mg/dL). Recent Labs  Lab 02/04/20 1813 02/04/20 2015 02/05/20 0003 02/05/20 0544 02/06/20 0327 02/07/20 0446 02/08/20 0436 02/09/20 0501  PROCALCITON  --   --  28.77  --   --  3.18 2.01  --   WBC 24.4*  --   --    < > 16.6* 13.0* 13.8* 16.6*  LATICACIDVEN 1.1 0.8  --   --   --   --   --   --    < > = values in this interval not displayed.    Liver Function Tests: Recent Labs  Lab 02/04/20 1813 02/05/20 0544 02/06/20 0327 02/07/20 0446 02/09/20 0501  AST 103* 75* 62*  --  31  ALT 63* 54* 54*  --  37  ALKPHOS 110 94 118  --  75  BILITOT 1.1 1.1 1.4*  --  1.0  PROT 7.8 6.0* 6.7  --  4.8*  ALBUMIN 3.5 2.4* 2.5* 2.0* 1.7*   Recent Labs  Lab 02/04/20 1813  LIPASE 14   Recent Labs  Lab 02/04/20 1813  AMMONIA 9    ABG    Component Value Date/Time   PHART 7.438 02/06/2020 1212   PCO2ART 56.8 (H) 02/06/2020 1212   PO2ART 431.0 (H) 02/06/2020 1212   HCO3 38.2 (H) 02/06/2020 1212   TCO2 40 (H) 02/06/2020 1212   ACIDBASEDEF 7.9 (H) 02/05/2020 0622   O2SAT 100.0 02/06/2020 1212     Coagulation Profile: Recent Labs  Lab 02/04/20 1813  INR 1.2    Cardiac Enzymes: Recent Labs  Lab 02/05/20 0544  CKTOTAL  1,445*    HbA1C: Hgb A1c MFr Bld  Date/Time Value Ref Range Status  02/07/2020 04:46 AM 5.6 4.8 - 5.6 % Final    Comment:    (NOTE) Pre diabetes:          5.7%-6.4% Diabetes:              >6.4% Glycemic control for   <7.0% adults with diabetes   08/04/2019 06:05 PM 5.1 4.8 - 5.6 % Final    Comment:    (NOTE) Pre diabetes:          5.7%-6.4% Diabetes:              >6.4% Glycemic control for   <7.0% adults with diabetes     CBG: Recent Labs  Lab 02/08/20 1151 02/08/20 1547 02/08/20 2022 02/09/20 0035 02/09/20 0438  GLUCAP 125* 101* 132* 123* 121*    Review of Systems:   Unable to obtain due to critical illness.   Past Medical History  He,  has a past medical history of Anxiety, Depression, and Hypertension.   Surgical History   No past surgical history on file.   Social History   reports that he has been smoking cigarettes. He has been smoking about 1.00 pack per day. He has never used smokeless tobacco. He reports current alcohol use. He reports current drug use. Drug: Heroin.   Family History   His family  history includes Hypertension in his father and mother.   Allergies No Known Allergies   Home Medications  Prior to Admission medications   Medication Sig Start Date End Date Taking? Authorizing Provider  acamprosate (CAMPRAL) 333 MG tablet Take 2 tablets (666 mg total) by mouth 3 (three) times daily. 08/11/19   Aldean Baker, NP  amLODipine (NORVASC) 5 MG tablet Take 1 tablet (5 mg total) by mouth daily. 08/12/19   Aldean Baker, NP  DULoxetine (CYMBALTA) 30 MG capsule Take 30 mg by mouth daily.    [provider]  DULoxetine (CYMBALTA) 60 MG capsule Take 1 capsule (60 mg total) by mouth daily. Patient not taking: Reported on 02/06/2020 08/12/19   Aldean Baker, NP  gabapentin (NEURONTIN) 400 MG capsule Take 1 capsule (400 mg total) by mouth 3 (three) times daily. Patient taking differently: Take 400 mg by mouth 4 (four) times daily.   08/11/19   Aldean Baker, NP  lisinopril (ZESTRIL) 20 MG tablet Take 1 tablet (20 mg total) by mouth daily. 08/12/19   Aldean Baker, NP  Multiple Vitamin (MULTIVITAMIN WITH MINERALS) TABS tablet Take 1 tablet by mouth daily. 08/12/19   Aldean Baker, NP  naproxen (NAPROSYN) 500 MG tablet Take 500 mg by mouth 2 (two) times daily with a meal.    [provider]  nicotine (NICODERM CQ - DOSED IN MG/24 HOURS) 21 mg/24hr patch Place 1 patch (21 mg total) onto the skin daily. 08/12/19   Aldean Baker, NP  OLANZapine (ZYPREXA) 10 MG tablet Take 1 tablet (10 mg total) by mouth at bedtime. Patient not taking: Reported on 02/06/2020 08/11/19   Aldean Baker, NP  OLANZapine (ZYPREXA) 15 MG tablet Take 15 mg by mouth at bedtime.    [provider]  Oxcarbazepine (TRILEPTAL) 300 MG tablet Take 1 tablet (300 mg total) by mouth 2 (two) times daily. 08/11/19   Aldean Baker, NP  traZODone (DESYREL) 100 MG tablet Take 100 mg by mouth at bedtime.    [provider]         Ginette Otto, DO IM PGY-3 Pager: [phone]

## 2020-02-09 NOTE — Progress Notes (Signed)
Subjective: No significant changes  Exam: Vitals:   02/09/20 0753 02/09/20 0800  BP:  110/79  Pulse:  68  Resp:  18  Temp: 99 F (37.2 C)   SpO2:  99%   Gen: In bed, intubated Resp: Ventilated Abd: soft, nt  Neuro: MS:  his eyes open, but he does not fixate or track, does not follow commands.  Eyes are relatively midline. CN: VOR intact, pupils are equal round and reactive, corneals are intact, blinks to threat bilaterally.  Motor: Moves all extremities spontaneously with good strength.  Sensory: Response to noxious stimulation x4  Pertinent Labs: Albumin 2.0 TSH 0.589 Ammonia 9 UDS negative CK on admission 1445 Gabapentin level, 1.5(4 - 16)  CSF RBC 1 CSF WBC 1 CSF protein 47 CSF glucose 66  MRI and spot EEG without explanation.  Impression: 54 year old male presenting with altered mental status, renal failure. Initially, a multifactorial encephalopathy seemed likley, but as time goes on and he remains encephalopathic despite improving labs, this remains unclear.  Currently, I do not have a clear explanation for his encephalopathy.  He is on high-dose thiamine, given low albumin I do think nutritional status is a consideration.   He had mild increased reflexes, but serotonin syndrome seems unlikely with this duration. +He had mildly elevated CK, but has not had increased tone to suggest NMS.   Though he had been reported as sober, he had a relapse of least as recent as October, and I think that alcohol withdrawal/delirium tremens is still a significant consideration but his vital signs have not been convincing.  Will obtain overnight EEG to rule out intermittent seizures.   Recommendations: 1) overnight EEG  2) will follow.   Ritta Slot, MD [facility] [phone]  If 7pm- 7am, please page neurology on call as listed in [facility]ION.

## 2020-02-10 ENCOUNTER — Inpatient Hospital Stay (HOSPITAL_COMMUNITY): Payer: Self-pay

## 2020-02-10 LAB — CSF CULTURE W GRAM STAIN
Culture: NO GROWTH
Gram Stain: NONE SEEN

## 2020-02-10 LAB — CBC WITH DIFFERENTIAL/PLATELET
Abs Immature Granulocytes: 0.91 10*3/uL — ABNORMAL HIGH (ref 0.00–0.07)
Basophils Absolute: 0.1 10*3/uL (ref 0.0–0.1)
Basophils Relative: 0 %
Eosinophils Absolute: 0.5 10*3/uL (ref 0.0–0.5)
Eosinophils Relative: 3 %
HCT: 32 % — ABNORMAL LOW (ref 39.0–52.0)
Hemoglobin: 10 g/dL — ABNORMAL LOW (ref 13.0–17.0)
Immature Granulocytes: 5 %
Lymphocytes Relative: 11 %
Lymphs Abs: 1.9 10*3/uL (ref 0.7–4.0)
MCH: 32.2 pg (ref 26.0–34.0)
MCHC: 31.3 g/dL (ref 30.0–36.0)
MCV: 102.9 fL — ABNORMAL HIGH (ref 80.0–100.0)
Monocytes Absolute: 1 10*3/uL (ref 0.1–1.0)
Monocytes Relative: 6 %
Neutro Abs: 12.7 10*3/uL — ABNORMAL HIGH (ref 1.7–7.7)
Neutrophils Relative %: 75 %
Platelets: 178 10*3/uL (ref 150–400)
RBC: 3.11 MIL/uL — ABNORMAL LOW (ref 4.22–5.81)
RDW: 15.3 % (ref 11.5–15.5)
WBC: 17 10*3/uL — ABNORMAL HIGH (ref 4.0–10.5)
nRBC: 0 % (ref 0.0–0.2)

## 2020-02-10 LAB — BASIC METABOLIC PANEL
Anion gap: 12 (ref 5–15)
BUN: 28 mg/dL — ABNORMAL HIGH (ref 6–20)
CO2: 25 mmol/L (ref 22–32)
Calcium: 8.2 mg/dL — ABNORMAL LOW (ref 8.9–10.3)
Chloride: 107 mmol/L (ref 98–111)
Creatinine, Ser: 1.07 mg/dL (ref 0.61–1.24)
GFR calc Af Amer: >60 mL/min (ref >60)
GFR calc non Af Amer: >60 mL/min (ref >60)
Glucose, Bld: 130 mg/dL — ABNORMAL HIGH (ref 70–99)
Potassium: 3.9 mmol/L (ref 3.5–5.1)
Sodium: 144 mmol/L (ref 135–145)

## 2020-02-10 LAB — CBC
HCT: 31.8 % — ABNORMAL LOW (ref 39.0–52.0)
Hemoglobin: 10 g/dL — ABNORMAL LOW (ref 13.0–17.0)
MCH: 31.6 pg (ref 26.0–34.0)
MCHC: 31.4 g/dL (ref 30.0–36.0)
MCV: 100.6 fL — ABNORMAL HIGH (ref 80.0–100.0)
Platelets: 191 10*3/uL (ref 150–400)
RBC: 3.16 MIL/uL — ABNORMAL LOW (ref 4.22–5.81)
RDW: 15.1 % (ref 11.5–15.5)
WBC: 17.1 10*3/uL — ABNORMAL HIGH (ref 4.0–10.5)
nRBC: 0 % (ref 0.0–0.2)

## 2020-02-10 LAB — CULTURE, BLOOD (ROUTINE X 2)
Culture: NO GROWTH
Culture: NO GROWTH
Special Requests: ADEQUATE
Special Requests: ADEQUATE

## 2020-02-10 LAB — GLUCOSE, CAPILLARY
Glucose-Capillary: 105 mg/dL — ABNORMAL HIGH (ref 70–99)
Glucose-Capillary: 111 mg/dL — ABNORMAL HIGH (ref 70–99)
Glucose-Capillary: 113 mg/dL — ABNORMAL HIGH (ref 70–99)
Glucose-Capillary: 113 mg/dL — ABNORMAL HIGH (ref 70–99)
Glucose-Capillary: 115 mg/dL — ABNORMAL HIGH (ref 70–99)
Glucose-Capillary: 122 mg/dL — ABNORMAL HIGH (ref 70–99)
Glucose-Capillary: 129 mg/dL — ABNORMAL HIGH (ref 70–99)

## 2020-02-10 LAB — PHOSPHORUS: Phosphorus: 4.5 mg/dL (ref 2.5–4.6)

## 2020-02-10 LAB — 10-HYDROXYCARBAZEPINE: Triliptal/MTB(Oxcarbazepin): <1 ug/mL — ABNORMAL LOW (ref 10–35)

## 2020-02-10 LAB — MAGNESIUM: Magnesium: 2.3 mg/dL (ref 1.7–2.4)

## 2020-02-10 MED ORDER — ACETAMINOPHEN 325 MG PO TABS: 650.0000 mg | ORAL_TABLET | Freq: Four times a day (QID) | ORAL | Status: DC | PRN

## 2020-02-10 MED ORDER — LORAZEPAM 2 MG/ML IJ SOLN
1.0000 mg | Freq: Once | INTRAMUSCULAR | Status: AC
  Administered 2020-02-10: 13:00:00 1 mg via INTRAVENOUS

## 2020-02-10 MED ORDER — FUROSEMIDE 10 MG/ML IJ SOLN
40.0000 mg | Freq: Once | INTRAMUSCULAR | Status: AC
  Administered 2020-02-10: 40 mg via INTRAVENOUS
  Filled 2020-02-10: qty 4

## 2020-02-10 MED ORDER — ACETAMINOPHEN 650 MG RE SUPP
650.0000 mg | Freq: Four times a day (QID) | RECTAL | Status: DC | PRN
  Administered 2020-02-10: 650 mg via RECTAL
  Filled 2020-02-10: qty 1

## 2020-02-10 MED ORDER — LORAZEPAM 2 MG/ML IJ SOLN
1.0000 mg | INTRAMUSCULAR | Status: AC
  Administered 2020-02-10: 13:00:00 1 mg via INTRAVENOUS

## 2020-02-10 MED ORDER — CHLORHEXIDINE GLUCONATE 0.12 % MT SOLN
15.0000 mL | Freq: Two times a day (BID) | OROMUCOSAL | Status: DC
  Administered 2020-02-10 – 2020-02-13 (×6): 15 mL via OROMUCOSAL
  Filled 2020-02-10 (×4): qty 15

## 2020-02-10 MED ORDER — HALOPERIDOL LACTATE 5 MG/ML IJ SOLN
5.0000 mg | Freq: Four times a day (QID) | INTRAMUSCULAR | Status: DC | PRN
  Filled 2020-02-10: qty 1

## 2020-02-10 MED ORDER — HALOPERIDOL LACTATE 5 MG/ML IJ SOLN
5.0000 mg | Freq: Once | INTRAMUSCULAR | Status: AC
  Administered 2020-02-10: 11:00:00 5 mg via INTRAVENOUS

## 2020-02-10 MED ORDER — ORAL CARE MOUTH RINSE
15.0000 mL | Freq: Two times a day (BID) | OROMUCOSAL | Status: DC
  Administered 2020-02-11 – 2020-02-12 (×4): 15 mL via OROMUCOSAL

## 2020-02-10 MED ORDER — LORAZEPAM 2 MG/ML IJ SOLN
INTRAMUSCULAR | Status: AC
  Filled 2020-02-10: qty 1

## 2020-02-10 MED ORDER — FUROSEMIDE 10 MG/ML IJ SOLN
40.0000 mg | Freq: Once | INTRAMUSCULAR | Status: AC
  Administered 2020-02-10: 16:00:00 40 mg via INTRAVENOUS
  Filled 2020-02-10: qty 4

## 2020-02-10 NOTE — Procedures (Addendum)
Patient Name: Donald Carroll  MRN: [id number]  Epilepsy Attending: Charlsie Quest  Referring Physician/Provider: Dr. Onalee Hua Duration: 02/09/2020 1524 to 02/10/2020 1139  Patient history: 54 year old male with altered mental status.  EEG evaluate for seizures.  Level of alertness: lethargic  AEDs during EEG study:  Oxcarbazepine, Versed  Technical aspects: This EEG study was done with scalp electrodes positioned according to the 10-20 International system of electrode placement. Electrical activity was acquired at a sampling rate of 500Hz  and reviewed with a high frequency filter of 70Hz  and a low frequency filter of 1Hz . EEG data were recorded continuously and digitally stored.   Description: During awake state, no clear posterior dominant rhythm was seen. EEG showed continuous generalized 3 to 5 Hz theta-delta slowing. Triphasic waves, generalized were also noted at times. Hyperventilation and photic stimulation were not performed.  Abnormality - Triphasic waves, generalized - Continuous slow, generalized  IMPRESSION: This study is suggestive of moderate diffuse encephalopathy, nonspecific to etiology but could be secondary to toxic-metabolic causes. No seizures or epileptiform discharges were seen throughout the recording.  [name] 

## 2020-02-10 NOTE — Progress Notes (Deleted)
NAME:  Donald Carroll, MRN:  [id number], DOB:  02-Dec-1965, LOS: 6 ADMISSION DATE:  02/04/2020, CONSULTATION DATE:  02/06/2020 REFERRING MD:  Dr. Antionette Char, CHIEF COMPLAINT:  Hypoxia, AMS  Brief History   54 yoM presenting with 1 day hx of AMS found to be hypotensive and hypoxic with AKI.  Suspected sepsis, started on empiric abx coverage, CXR initially with mild bibasilar changes.  Since ongoing encephalopathy, still requiring NRB with increasing respiratory distress.  CXR now with worsening bilateral infiltrates, PCCM consulted.   History of present illness   HPI obtained from medical chart review as patient is encephalopathic.    54 year old male with prior history of tobacco, ETOH/ substance abuse (heroin), HTN, BPD, PTSD, SI in 10/21 who presented 4/13 with one day history of altered mental status and twitching.  Unclear last ETOH/ substance abuse.  Found hypotensive and hypoxic requiring NRB.    Workup notable for WBC 24, Na 126, K 5.4, BUN 91, sCR 6.35, elevated LFTs, PCT 28.77,  CXR with mild bibasilar infiltrates, SARS 2 neg, BNB 252, CK 1445,  ETOH neg, salicylates, UDS neg.  CT head normal.  Had urinary retention requiring foley insertion.  Nephrology consulted and given more fluids with improving AKI.  Ongoing altered mental status.  On CIWA needing ativan q 2-3 hours.  Worsening PaO2, remains on NRB.  Repeat CXR overnight with progressive diffuse bilateral airspace disease thought to represent worsening pneumonia +/- multifocal pneumonia.  Remains afebrile, blood pressure stable, improving WBC.  Was given 60mg  lasix with good UOP however still requiring NRB and increased work of breathing, therefore PCCM consulted for evaluation.   Past Medical History  Tobacco abuse, ETOH/ substance abuse (heroin), HTN, BPD, PTSD, SI in 10/21  Significant Hospital Events   4/13 Admit  4/15 Intubated   Consults:  Nephrology  Procedures:  4/13: Foley  4/15: ETT  Significant Diagnostic Tests:    4/13 [facility] >> neg  4/13 5/13 abd complete >>  1. Hepatic steatosis. 2. Otherwise unremarkable exam.  EEG 4/15>> Abnormality -Triphasic waves, generalized -Continuous slow, generalized  IMPRESSION: This study is suggestive of moderate diffuse encephalopathy, nonspecific to etiology but could be secondary to toxic-metabolic causes.  No seizures or epileptiform discharges were seen throughout the recording.  MR Brain: 4/6 IMPRESSION: - Unremarkable non-contrast MRI appearance of the brain. No evidence of acute intracranial abnormality. - Paranasal sinus mucosal thickening with bilateral maxillary and sphenoid sinus air-fluid levels. Correlate for acute sinusitis. - Bilateral mastoid effusions.  Micro Data:  4/13 SARS 2/ Flu A/B >> Negative 4/13 BCx 2 >> NGTD 4/13 UC >> NGTD 4/14 MRSA PCR >> neg 4/15: Respiratory cultures >> Rare GPCs and Yeast  Antimicrobials:  4/13 vancomycin>> 4/13 4/13 ceftriaxone >> 4/14 azithromycin >>  Interim history/subjective:  Continues to fever overnight. Sedated on Vent this AM.   Objective   Blood pressure 101/73, pulse 70, temperature (!) 101.1 F (38.4 C), temperature source Oral, resp. rate 18, height 6' (1.829 m), weight 96.4 kg, SpO2 99 %.    Vent Mode: PRVC FiO2 (%):  [40 %] 40 % Set Rate:  [18 bmp] 18 bmp Vt Set:  [620 mL] 620 mL PEEP:  [5 cmH20-8 cmH20] 5 cmH20 Plateau Pressure:  [16 cmH20-28 cmH20] 23 cmH20   Intake/Output Summary (Last 24 hours) at 02/10/2020 0811 Last data filed at 02/10/2020 02/12/2020 Gross per 24 hour  Intake 3820.76 ml  Output 3980 ml  Net -159.24 ml   Filed Weights   02/08/20  0500 02/09/20 0500 02/10/20 0421  Weight: 94.9 kg 95 kg 96.4 kg   Examination: General: Intubated and sedated HENT: Normocephalic, atraumatic, moist mucus membranes Pulm: Good air movement with mechanical CV: RRR, systolic mumur, no rubs  Abdomen: Active bowel sounds, soft  Extremities: Pulses palpable, no LE edema  Skin: Warm and  dry  Neuro: Sedated  Resolved Hospital Problem list   AKI  Assessment & Plan:   ARDS 2/2 Bilateral PNA believed to be CAP vs Aspiration  - Fevering o/n, WBC now trending up 14 >> 17 - Resp Cultures growing few yeast (GPCs on Gram Stain) - Remains on MV with minimal vent settings (40%, PEEP 5)  - Urine strep and Leginella negative - Continue ceftriaxone and azithromycin (total of 7d) - Sedation, Fentanyl, Precedex, PRN Versed. RASS goal 0 to -1 - Net even yesterday, Plan to continue diuresis  Acute Encephalopathy, metabolic vs toxic: Remains agitated when sedation is weaned - Neurology following - Work up negative for toxic etiology - CT Head and MR Brain w/o Acute Abnormality - LP Cell Ct normal, No growth, HSV negative - Spot EEG negative, Continuous EEG ongoing - Continue thiamine  Hypernatremia:  - Na 146, will continue free water  Depression, PTSD, ?Bipolar - Continue Home trileptal  Best practice:  Diet: TFs Pain/Anxiety/Delirium protocol (if indicated): Precedex, Fentanyl VAP protocol (if indicated): Per protocol DVT prophylaxis: heparin SQ GI prophylaxis: Protonix Glucose control: SSI every 4 hours with tube feeds Mobility: BR Code Status: Full  Family Communication: Family Updated 4/18 by phone and bedside (Son and significant other) Disposition: ICU   Labs   CBC: Recent Labs  Lab 02/04/20 1813 02/05/20 0544 02/06/20 0327 02/06/20 0327 02/06/20 1212 02/07/20 0446 02/08/20 0436 02/09/20 0501 02/10/20 0420  WBC 24.4*   < > 16.6*  --   --  13.0* 13.8* 16.6* 17.1*  NEUTROABS 22.0*  --   --   --   --   --   --   --   --   HGB 14.2   < > 13.2   < > 12.2* 11.2* 10.8* 10.6* 10.0*  HCT 42.1   < > 38.9*   < > 36.0* 33.7* 33.3* 32.5* 31.8*  MCV 95.5   < > 93.5  --   --  95.7 96.5 97.6 100.6*  PLT 254   < > 242  --   --  233 226 205 191   < > = values in this interval not displayed.    Basic Metabolic Panel: Recent Labs  Lab 02/05/20 0003 02/05/20 0544  02/06/20 0327 02/06/20 1212 02/07/20 0446 02/08/20 0436 02/09/20 0501 02/09/20 1526 02/10/20 0420  NA  --    < > 142   < > 147* 145 147* 148* 144  K  --    < > 4.3   < > 3.7 3.9 3.1* 4.3 3.9  CL  --    < > 102  --  104 109 115* 111 107  CO2  --    < > 26  --  31 28 25 27 25   GLUCOSE  --    < > 102*  --  101* 114* 123* 118* 130*  BUN  --    < > 34*  --  25* 20 23* 26* 28*  CREATININE  --    < > 1.21  --  1.07 1.00 0.83 1.03 1.07  CALCIUM  --    < > 8.8*  --  8.7* 8.1* 7.1*  8.1* 8.2*  MG 2.7*  --  2.2  --  2.0 2.0 1.6*  --  2.3  PHOS 6.7*  --  2.2*  --  1.9* 4.6  --   --  4.5   < > = values in this interval not displayed.   GFR: Estimated Creatinine Clearance: 98.3 mL/min (by C-G formula based on SCr of 1.07 mg/dL). Recent Labs  Lab 02/04/20 1813 02/04/20 2015 02/05/20 0003 02/05/20 0544 02/07/20 0446 02/08/20 0436 02/09/20 0501 02/10/20 0420  PROCALCITON  --   --  28.77  --  3.18 2.01  --   --   WBC 24.4*  --   --    < > 13.0* 13.8* 16.6* 17.1*  LATICACIDVEN 1.1 0.8  --   --   --   --   --   --    < > = values in this interval not displayed.    Liver Function Tests: Recent Labs  Lab 02/04/20 1813 02/05/20 0544 02/06/20 0327 02/07/20 0446 02/09/20 0501  AST 103* 75* 62*  --  31  ALT 63* 54* 54*  --  37  ALKPHOS 110 94 118  --  75  BILITOT 1.1 1.1 1.4*  --  1.0  PROT 7.8 6.0* 6.7  --  4.8*  ALBUMIN 3.5 2.4* 2.5* 2.0* 1.7*   Recent Labs  Lab 02/04/20 1813  LIPASE 14   Recent Labs  Lab 02/04/20 1813  AMMONIA 9    ABG    Component Value Date/Time   PHART 7.438 02/06/2020 1212   PCO2ART 56.8 (H) 02/06/2020 1212   PO2ART 431.0 (H) 02/06/2020 1212   HCO3 38.2 (H) 02/06/2020 1212   TCO2 40 (H) 02/06/2020 1212   ACIDBASEDEF 7.9 (H) 02/05/2020 0622   O2SAT 100.0 02/06/2020 1212     Coagulation Profile: Recent Labs  Lab 02/04/20 1813  INR 1.2    Cardiac Enzymes: Recent Labs  Lab 02/05/20 0544 02/09/20 0935  CKTOTAL 1,445* 148     HbA1C: Hgb A1c MFr Bld  Date/Time Value Ref Range Status  02/07/2020 04:46 AM 5.6 4.8 - 5.6 % Final    Comment:    (NOTE) Pre diabetes:          5.7%-6.4% Diabetes:              >6.4% Glycemic control for   <7.0% adults with diabetes   08/04/2019 06:05 PM 5.1 4.8 - 5.6 % Final    Comment:    (NOTE) Pre diabetes:          5.7%-6.4% Diabetes:              >6.4% Glycemic control for   <7.0% adults with diabetes     CBG: Recent Labs  Lab 02/09/20 1158 02/09/20 1531 02/09/20 2027 02/10/20 0023 02/10/20 0445  GLUCAP 127* 102* 131* 122* 129*    Review of Systems:   Unable to obtain due to critical illness.   Past Medical History  He,  has a past medical history of Anxiety, Depression, and Hypertension.   Surgical History   No past surgical history on file.   Social History   reports that he has been smoking cigarettes. He has been smoking about 1.00 pack per day. He has never used smokeless tobacco. He reports current alcohol use. He reports current drug use. Drug: Heroin.   Family History   His family history includes Hypertension in his father and mother.   Allergies No Known Allergies   Home Medications  Prior to Admission medications   Medication Sig Start Date End Date Taking? Authorizing Provider  acamprosate (CAMPRAL) 333 MG tablet Take 2 tablets (666 mg total) by mouth 3 (three) times daily. 08/11/19   Aldean Baker, NP  amLODipine (NORVASC) 5 MG tablet Take 1 tablet (5 mg total) by mouth daily. 08/12/19   Aldean Baker, NP  DULoxetine (CYMBALTA) 30 MG capsule Take 30 mg by mouth daily.    [provider]  DULoxetine (CYMBALTA) 60 MG capsule Take 1 capsule (60 mg total) by mouth daily. Patient not taking: Reported on 02/06/2020 08/12/19   Aldean Baker, NP  gabapentin (NEURONTIN) 400 MG capsule Take 1 capsule (400 mg total) by mouth 3 (three) times daily. Patient taking differently: Take 400 mg by mouth 4 (four) times daily.  08/11/19    Aldean Baker, NP  lisinopril (ZESTRIL) 20 MG tablet Take 1 tablet (20 mg total) by mouth daily. 08/12/19   Aldean Baker, NP  Multiple Vitamin (MULTIVITAMIN WITH MINERALS) TABS tablet Take 1 tablet by mouth daily. 08/12/19   Aldean Baker, NP  naproxen (NAPROSYN) 500 MG tablet Take 500 mg by mouth 2 (two) times daily with a meal.    [provider]  nicotine (NICODERM CQ - DOSED IN MG/24 HOURS) 21 mg/24hr patch Place 1 patch (21 mg total) onto the skin daily. 08/12/19   Aldean Baker, NP  OLANZapine (ZYPREXA) 10 MG tablet Take 1 tablet (10 mg total) by mouth at bedtime. Patient not taking: Reported on 02/06/2020 08/11/19   Aldean Baker, NP  OLANZapine (ZYPREXA) 15 MG tablet Take 15 mg by mouth at bedtime.    [provider]  Oxcarbazepine (TRILEPTAL) 300 MG tablet Take 1 tablet (300 mg total) by mouth 2 (two) times daily. 08/11/19   Aldean Baker, NP  traZODone (DESYREL) 100 MG tablet Take 100 mg by mouth at bedtime.    [provider]       Ginette Otto, DO IM PGY-3 Pager: [phone]

## 2020-02-10 NOTE — Progress Notes (Signed)
LTM discontinued; no skin breakdown was seen. 

## 2020-02-10 NOTE — Progress Notes (Addendum)
MD ordered 1 mg Ativan for agitation during Cortrak placement. MD then ordered for the other 1 mg of the vial to be given and placed a second order for 1 mg Ativan for the patient. This RN administered the ordered 2 mg total of Ativan from the 1 vial removed from the Pyxis.

## 2020-02-10 NOTE — Procedures (Signed)
Extubation Procedure Note  Patient Details:   Name: Donald Carroll DOB: 07/29/1966 MRN: [id number]   Airway Documentation:    Vent end date: 02/10/20 Vent end time: 0940   Evaluation  O2 sats: stable throughout Complications: No apparent complications Patient did tolerate procedure well. Bilateral Breath Sounds: Diminished   Yes   Positive for air leak around deflated cuff. No stridor noted.    Diannia Hogenson 02/10/2020, 9:47 AM

## 2020-02-10 NOTE — Progress Notes (Signed)
Neurology Progress Note   S:// Seen and examined.  No acute changes.  O:// Current vital signs: BP 101/73   Pulse 70   Temp (!) 101.1 F (38.4 C) (Oral)   Resp 18   Ht 6' (1.829 m)   Wt 96.4 kg   SpO2 99%   BMI 28.82 kg/m  Vital signs in last 24 hours: Temp:  [98.5 F (36.9 C)-101.1 F (38.4 C)] 101.1 F (38.4 C) (04/19 0447) Pulse Rate:  [66-88] 70 (04/19 0600) Resp:  [18] 18 (04/19 0600) BP: (90-121)/(63-82) 101/73 (04/19 0600) SpO2:  [98 %-100 %] 99 % (04/19 0600) FiO2 (%):  [40 %] 40 % (04/19 0728) Weight:  [96.4 kg] 96.4 kg (04/19 0421) General: Sedated on Precedex and fentanyl drip. HEENT: Normocephalic atraumatic Lungs: Vented Cardiovascular: Regular rate rhythm Neurological exam Sedated Opens eyes to noxious stimulation Withdraws spontaneously all 4 extremities vigorously Blinks to threat very purposefully Has a mildly disconjugate gaze but no gaze deviation or preference. Does not follow any commands. Completely nonverbal Breathing over the ventilator  Medications  Current Facility-Administered Medications:  .  0.9 %  sodium chloride infusion, , Intravenous, PRN, Rigoberto Noel, MD, Last Rate: 10 mL/hr at 02/10/20 0600, Rate Verify at 02/10/20 0600 .  acetaminophen (TYLENOL) tablet 650 mg, 650 mg, Oral, Q6H PRN, Margaretha Seeds, MD, 650 mg at 02/09/20 1804 .  azithromycin (ZITHROMAX) 500 mg in sodium chloride 0.9 % 250 mL IVPB, 500 mg, Intravenous, Q24H, Neva Seat, MD, Stopped at 02/09/20 1913 .  cefTRIAXone (ROCEPHIN) 2 g in sodium chloride 0.9 % 100 mL IVPB, 2 g, Intravenous, Q24H, Neva Seat, MD, Stopped at 02/09/20 1839 .  chlorhexidine gluconate (MEDLINE KIT) (PERIDEX) 0.12 % solution 15 mL, 15 mL, Mouth Rinse, BID, Rigoberto Noel, MD, 15 mL at 02/10/20 0741 .  Chlorhexidine Gluconate Cloth 2 % PADS 6 each, 6 each, Topical, Daily, Collene Gobble, MD, 6 each at 02/10/20 0300 .  cloNIDine (CATAPRES) tablet 0.3 mg, 0.3 mg, Per Tube,  Q6H, Margaretha Seeds, MD, 0.3 mg at 02/10/20 0416 .  dexmedetomidine (PRECEDEX) 400 MCG/100ML (4 mcg/mL) infusion, 0.4-1.2 mcg/kg/hr, Intravenous, Titrated, Stretch, Marily Lente, MD, Last Rate: 31.1 mL/hr at 02/10/20 0600, 1.2 mcg/kg/hr at 02/10/20 0600 .  docusate (COLACE) 50 MG/5ML liquid 100 mg, 100 mg, Per Tube, BID, Rigoberto Noel, MD, 100 mg at 02/09/20 2204 .  feeding supplement (VITAL 1.5 CAL) liquid 1,000 mL, 1,000 mL, Per Tube, Q24H, Rigoberto Noel, MD, Last Rate: 20 mL/hr at 02/09/20 1435, 1,000 mL at 02/09/20 1435 .  fentaNYL (SUBLIMAZE) injection 50 mcg, 50 mcg, Intravenous, Q15 min PRN, Kara Mead V, MD .  fentaNYL (SUBLIMAZE) injection 50-200 mcg, 50-200 mcg, Intravenous, Q30 min PRN, Rigoberto Noel, MD, 100 mcg at 02/10/20 0637 .  fentaNYL 2523mg in NS 2520m(1080mml) infusion-PREMIX, 0-400 mcg/hr, Intravenous, Continuous, MelNeva SeatD, Last Rate: 40 mL/hr at 02/10/20 0600, 400 mcg/hr at 02/10/20 0600 .  folic acid injection 1 mg, 1 mg, Intravenous, Daily, GheCruzita Ledererostin M, MD, 1 mg at 02/09/20 0921 .  free water 300 mL, 300 mL, Per Tube, Q4H, EllMargaretha SeedsD, 300 mL at 02/10/20 0741 .  furosemide (LASIX) injection 40 mg, 40 mg, Intravenous, Once, MelNeva SeatD .  heparin injection 5,000 Units, 5,000 Units, Subcutaneous, Q8H, GarEtta QuillO, 5,000 Units at 02/10/20 [phone] insulin aspart (novoLOG) injection 0-9 Units, 0-9 Units, Subcutaneous, Q4H, Helberg, Justin, MD, 1 Units at 02/10/20 0500 .  MEDLINE mouth rinse, 15 mL, Mouth Rinse, 10 times per day, Rigoberto Noel, MD, 15 mL at 02/10/20 0636 .  midazolam (VERSED) injection 1-2 mg, 1-2 mg, Intravenous, Q2H PRN, Margaretha Seeds, MD, 2 mg at 02/10/20 0738 .  multivitamin liquid 15 mL, 15 mL, Per Tube, Daily, Rigoberto Noel, MD, 15 mL at 02/09/20 0908 .  OXcarbazepine (TRILEPTAL) 300 MG/5ML suspension 300 mg, 300 mg, Per Tube, BID, Rigoberto Noel, MD, 300 mg at 02/09/20 2204 .  pantoprazole sodium  (PROTONIX) 40 mg/20 mL oral suspension 40 mg, 40 mg, Per Tube, Daily, Rigoberto Noel, MD, 40 mg at 02/09/20 0908 .  polyethylene glycol (MIRALAX / GLYCOLAX) packet 17 g, 17 g, Per Tube, Daily, Rigoberto Noel, MD, 17 g at 02/09/20 0908 .  QUEtiapine (SEROQUEL) tablet 100 mg, 100 mg, Per Tube, QHS, Margaretha Seeds, MD, 100 mg at 02/09/20 2204 .  sodium chloride flush (NS) 0.9 % injection 10-40 mL, 10-40 mL, Intracatheter, Q12H, Rigoberto Noel, MD, 10 mL at 02/09/20 0925 .  sodium chloride flush (NS) 0.9 % injection 10-40 mL, 10-40 mL, Intracatheter, PRN, Rigoberto Noel, MD .  thiamine 537m in normal saline (568m IVPB, 500 mg, Intravenous, Daily, Stopped at 02/09/20 0941 **FOLLOWED BY** thiamine (B-1) 250 mg in sodium chloride 0.9 % 50 mL IVPB, 250 mg, Intravenous, Daily, HeIna HomesMD Labs CBC    Component Value Date/Time   WBC 17.1 (H) 02/10/2020 0420   RBC 3.16 (L) 02/10/2020 0420   HGB 10.0 (L) 02/10/2020 0420   HCT 31.8 (L) 02/10/2020 0420   PLT 191 02/10/2020 0420   MCV 100.6 (H) 02/10/2020 0420   MCH 31.6 02/10/2020 0420   MCHC 31.4 02/10/2020 0420   RDW 15.1 02/10/2020 0420   LYMPHSABS 0.7 02/04/2020 1813   MONOABS 1.2 (H) 02/04/2020 1813   EOSABS 0.0 02/04/2020 1813   BASOSABS 0.0 02/04/2020 1813    CMP     Component Value Date/Time   NA 144 02/10/2020 0420   K 3.9 02/10/2020 0420   CL 107 02/10/2020 0420   CO2 25 02/10/2020 0420   GLUCOSE 130 (H) 02/10/2020 0420   BUN 28 (H) 02/10/2020 0420   CREATININE 1.07 02/10/2020 0420   CALCIUM 8.2 (L) 02/10/2020 0420   PROT 4.8 (L) 02/09/2020 0501   ALBUMIN 1.7 (L) 02/09/2020 0501   AST 31 02/09/2020 0501   ALT 37 02/09/2020 0501   ALKPHOS 75 02/09/2020 0501   BILITOT 1.0 02/09/2020 0501   GFRNONAA >60 02/10/2020 0420   GFRAA >60 02/10/2020 0420    glycosylated hemoglobin  Lipid Panel     Component Value Date/Time   CHOL 263 (H) 08/07/2019 0647   TRIG 786 (H) 02/06/2020 1208   HDL 37 (L) 08/07/2019 0647    CHOLHDL 7.1 08/07/2019 0647   VLDL 48 (H) 08/07/2019 0647   LDLCALC 178 (H) 08/07/2019 0647   CSF -RBC 1, WBC 1, protein 47, glucose 66.  CK on admission 1445.  Creatinine on admission was 6.35.  AST 103, ALT 63, sodium 126, BUN 91.  Imaging I have reviewed images in epic and the results pertinent to this consultation are: Reviewed MRI-no acute changes.  Spot EEG-no seizures LTM EEG-pending  Assessment: [age]ear old presenting with altered mental status, acute renal failure.  Initially thought to be multifactorial encephalopathy but remains encephalopathic despite improving labs. Has a remote history of alcohol abuse but has been sober for an extended period of time. He is on high-dose  thiamine Less likely NMS although he did have CK elevation and some hyperreflexia. Most likely has ongoing encephalopathy from all the metabolic derangements, which I suspect would improve with time and supportive care. Intermittent seizure activity needs to be evaluated-pending LTM results  Impression: Toxic metabolic encephalopathy  Recommendations: Await LTM EEG report Supportive care per primary team as you are I would recommend attempting to wean and extubate, lower sedation, and then examine-would expect him to improve. Discussed with the care team including resident MD in charge of patient's care in the ICU.  -- Donald Portland, MD [facility] Pager: [phone] If 7pm to 7am, please call on call as listed on AMION.

## 2020-02-10 NOTE — Progress Notes (Addendum)
NAME:  Donald Carroll, MRN:  [id number], DOB:  September 07, 1966, LOS: 6 ADMISSION DATE:  02/04/2020, CONSULTATION DATE:  02/06/2020 REFERRING MD:  Dr. Myna Hidalgo, CHIEF COMPLAINT:  Hypoxia, AMS  Brief History   22 yoM presenting with 1 day hx of AMS found to be hypotensive and hypoxic with AKI. Suspected sepsis, started on empiric abx coverage, CXR initially with mild bibasilar changes. Since ongoing encephalopathy, still requiring NRB with increasing respiratory distress. CXR now with worsening bilateral infiltrates, PCCM consulted.   History of present illness   HPI obtained from medical chart review as patient is encephalopathic.   54 year old male with prior history of tobacco,ETOH/ substance abuse(heroin), HTN, BPD, PTSD, SI in 10/21who presented 4/13 with one day history of altered mental status and twitching. Unclear last ETOH/ substance abuse. Found hypotensive and hypoxic requiring NRB.   Workup notable for WBC 24, Na 126, K 5.4, BUN 91, sCR 6.35, elevated LFTs, PCT 28.77, CXR with mild bibasilar infiltrates, SARS 2 neg, BNB 252, CK 9528, ETOH neg, salicylates, UDS neg. CT head normal. Had urinary retention requiring foley insertion. Nephrology consulted and given more fluids with improving AKI. Ongoing altered mental status. On CIWA needing ativan q 2-3 hours. Worsening PaO2, remains on NRB. Repeat CXR overnight with progressive diffuse bilateral airspace disease thought to represent worsening pneumonia +/- multifocal pneumonia. Remains afebrile, blood pressure stable, improving WBC.Was given 60mg  lasix with good UOP however still requiring NRB and increased work of breathing, therefore PCCM consulted for evaluation.  Past Medical History  Tobacco abuse ETOH/ substance abuse(heroin) HTN  BPD  PTSD SI in 10/21  Significant Hospital Events   4/13 Admit 4/15 Intubated  4/15 CVC placed 4/19 NG tube placed 4/19 Extubated 4/20 Pass swallow eval/NG tube  removed Consults:  Nephrology   Procedures:  4/13: Foley>>4/20 4/15: ETT>>4/20  Significant Diagnostic Tests:  4/13 CT head >> neg  4/13 [place] abd complete >> 1. Hepatic steatosis. 2. Otherwise unremarkable exam.  EEG 4/15>> Abnormality -Triphasic waves, generalized -Continuous slow, generalized  IMPRESSION: This study issuggestive of moderate diffuse encephalopathy, nonspecific to etiology but could be secondary to toxic-metabolic causes. No seizures or epileptiform discharges were seen throughout the recording.  MR Brain: 4/16 IMPRESSION: - Unremarkable non-contrast MRI appearance of the brain. No evidence of acute intracranial abnormality. - Paranasal sinus mucosal thickening with bilateral maxillary and sphenoid sinus air-fluid levels. Correlate for acute sinusitis. - Bilateral mastoid effusions.  4/19 EEG: This study issuggestive of moderate diffuse encephalopathy, nonspecific to etiology but could be secondary to toxic-metabolic causes.No seizures or epileptiform discharges were seen throughout the recording.  Micro Data:  4/13 SARS 2/ Flu A/B >>Negative 4/13 BCx 2 >> NGTD 4/13 UC >>NGTD 4/14 MRSA PCR >> neg 4/15: Respiratory cultures >> Rare GPCs and Yeast  Antimicrobials:  4/13 vancomycin>> 4/13 4/13 ceftriaxone >>4/20 4/14 azithromycin >>4/20  Interim history/subjective:  Patient is alert and oriented and in no apparent distress. He denies any new complaints at this time.   Objective   Blood pressure 106/75, pulse 85, temperature (!) 97.4 F (36.3 C), temperature source Axillary, resp. rate (!) 30, height 6' (1.829 m), weight 96.4 kg, SpO2 100 %.    Vent Mode: Stand-by FiO2 (%):  [40 %] 40 % Set Rate:  [18 bmp] 18 bmp Vt Set:  [620 mL] 620 mL PEEP:  [5 cmH20] 5 cmH20 Plateau Pressure:  [16 cmH20-23 cmH20] 23 cmH20   Intake/Output Summary (Last 24 hours) at 02/10/2020 2026 Last data filed at 02/10/2020  1946 Gross per 24 hour  Intake 2718.38 ml   Output 6165 ml  Net -3446.62 ml   Filed Weights   02/08/20 0500 02/09/20 0500 02/10/20 0421  Weight: 94.9 kg 95 kg 96.4 kg    Examination: Physical Exam  Constitutional: He is oriented to person, place, and time. No distress.  HENT:  Head: Normocephalic and atraumatic.  Eyes: EOM are normal.  Cardiovascular: Normal rate and intact distal pulses.  Pulmonary/Chest: Effort normal. No respiratory distress.  Abdominal: Soft. He exhibits no distension.  Musculoskeletal:        General: No edema. Normal range of motion.     Cervical back: Normal range of motion.  Neurological: He is alert and oriented to person, place, and time.  Skin: Skin is warm and dry. He is not diaphoretic.    Resolved Hospital Problem list   AKI  Assessment & Plan:   Bilateral PNA believed to be CAP vs Aspiration  - Fevering o/n, WBC now trending up 14 >> 17>>16.4 - Resp Cultures growing few yeast (GPCs on Gram Stain) - Extubated and maintaining SpO2 of 97% on 3L. - Urine strep and Leginella negative - Continue ceftriaxone and azithromycin day 7/7. - Off of sedation and requiring minimal pain medication. - Transfer out of the ICU today. - PT/OT, Speech evaluation, Senna for BM  Acute Encephalopathy, metabolic vs toxic: Remains agitated when sedation is weaned - Patient's encephalopathy has resolved.  - Continue thiamine  Hypernatremia:  - Na 146, will continue free water  Depression, PTSD, ?Bipolar - Continue Home trileptal  Best practice:  Diet:pend speech eval Pain/Anxiety/Delirium protocol (if indicated):Precedex, Fentanyl weaned VAP protocol (if indicated):Per protocol DVT prophylaxis:heparin SQ GI prophylaxis:Protonix Glucose control:SSI every 4 hours with tube feeds Mobility:BR>>PT/OT Code Status:Full Family Communication:Family Updated 4/18 by phone and bedside (Son and significant other) Disposition:transfer to progressive  Labs   CBC: Recent Labs  Lab  02/04/20 1813 02/05/20 0544 02/06/20 0327 02/06/20 0327 02/06/20 1212 02/07/20 0446 02/08/20 0436 02/09/20 0501 02/10/20 0420  WBC 24.4*   < > 16.6*  --   --  13.0* 13.8* 16.6* 17.0*  17.1*  NEUTROABS 22.0*  --   --   --   --   --   --   --  12.7*  HGB 14.2   < > 13.2   < > 12.2* 11.2* 10.8* 10.6* 10.0*  10.0*  HCT 42.1   < > 38.9*   < > 36.0* 33.7* 33.3* 32.5* 32.0*  31.8*  MCV 95.5   < > 93.5  --   --  95.7 96.5 97.6 102.9*  100.6*  PLT 254   < > 242  --   --  233 226 205 178  191   < > = values in this interval not displayed.    Basic Metabolic Panel: Recent Labs  Lab 02/05/20 0003 02/05/20 0544 02/06/20 0327 02/06/20 1212 02/07/20 0446 02/08/20 0436 02/09/20 0501 02/09/20 1526 02/10/20 0420  NA  --    < > 142   < > 147* 145 147* 148* 144  K  --    < > 4.3   < > 3.7 3.9 3.1* 4.3 3.9  CL  --    < > 102  --  104 109 115* 111 107  CO2  --    < > 26  --  31 28 25 27 25   GLUCOSE  --    < > 102*  --  101* 114* 123* 118* 130*  BUN  --    < > 34*  --  25* 20 23* 26* 28*  CREATININE  --    < > 1.21  --  1.07 1.00 0.83 1.03 1.07  CALCIUM  --    < > 8.8*  --  8.7* 8.1* 7.1* 8.1* 8.2*  MG 2.7*  --  2.2  --  2.0 2.0 1.6*  --  2.3  PHOS 6.7*  --  2.2*  --  1.9* 4.6  --   --  4.5   < > = values in this interval not displayed.   GFR: Estimated Creatinine Clearance: 98.3 mL/min (by C-G formula based on SCr of 1.07 mg/dL). Recent Labs  Lab 02/04/20 1813 02/04/20 2015 02/05/20 0003 02/05/20 0544 02/07/20 0446 02/08/20 0436 02/09/20 0501 02/10/20 0420  PROCALCITON  --   --  28.77  --  3.18 2.01  --   --   WBC 24.4*  --   --    < > 13.0* 13.8* 16.6* 17.0*  17.1*  LATICACIDVEN 1.1 0.8  --   --   --   --   --   --    < > = values in this interval not displayed.    Liver Function Tests: Recent Labs  Lab 02/04/20 1813 02/05/20 0544 02/06/20 0327 02/07/20 0446 02/09/20 0501  AST 103* 75* 62*  --  31  ALT 63* 54* 54*  --  37  ALKPHOS 110 94 118  --  75  BILITOT  1.1 1.1 1.4*  --  1.0  PROT 7.8 6.0* 6.7  --  4.8*  ALBUMIN 3.5 2.4* 2.5* 2.0* 1.7*   Recent Labs  Lab 02/04/20 1813  LIPASE 14   Recent Labs  Lab 02/04/20 1813  AMMONIA 9    ABG    Component Value Date/Time   PHART 7.438 02/06/2020 1212   PCO2ART 56.8 (H) 02/06/2020 1212   PO2ART 431.0 (H) 02/06/2020 1212   HCO3 38.2 (H) 02/06/2020 1212   TCO2 40 (H) 02/06/2020 1212   ACIDBASEDEF 7.9 (H) 02/05/2020 0622   O2SAT 100.0 02/06/2020 1212     Coagulation Profile: Recent Labs  Lab 02/04/20 1813  INR 1.2    Cardiac Enzymes: Recent Labs  Lab 02/05/20 0544 02/09/20 0935  CKTOTAL 1,445* 148    HbA1C: Hgb A1c MFr Bld  Date/Time Value Ref Range Status  02/07/2020 04:46 AM 5.6 4.8 - 5.6 % Final    Comment:    (NOTE) Pre diabetes:          5.7%-6.4% Diabetes:              >6.4% Glycemic control for   <7.0% adults with diabetes   08/04/2019 06:05 PM 5.1 4.8 - 5.6 % Final    Comment:    (NOTE) Pre diabetes:          5.7%-6.4% Diabetes:              >6.4% Glycemic control for   <7.0% adults with diabetes     CBG: Recent Labs  Lab 02/10/20 0445 02/10/20 0842 02/10/20 1252 02/10/20 1508 02/10/20 1948  GLUCAP 129* 105* 115* 113* 113*    Review of Systems:   Patient denied any ROS today.  Past Medical History  He,  has a past medical history of Anxiety, Depression, and Hypertension.   Surgical History   No past surgical history on file.   Social History   reports that he has been smoking cigarettes. He has been  smoking about 1.00 pack per day. He has never used smokeless tobacco. He reports current alcohol use. He reports current drug use. Drug: Heroin.   Family History   His family history includes Hypertension in his father and mother.   Allergies No Known Allergies   Home Medications  Prior to Admission medications   Medication Sig Start Date End Date Taking? Authorizing Provider  acamprosate (CAMPRAL) 333 MG tablet Take 2 tablets (666 mg  total) by mouth 3 (three) times daily. 08/11/19   Aldean Baker, NP  amLODipine (NORVASC) 5 MG tablet Take 1 tablet (5 mg total) by mouth daily. 08/12/19   Aldean Baker, NP  DULoxetine (CYMBALTA) 30 MG capsule Take 30 mg by mouth daily.    [provider]  DULoxetine (CYMBALTA) 60 MG capsule Take 1 capsule (60 mg total) by mouth daily. Patient not taking: Reported on 02/06/2020 08/12/19   Aldean Baker, NP  gabapentin (NEURONTIN) 400 MG capsule Take 1 capsule (400 mg total) by mouth 3 (three) times daily. Patient taking differently: Take 400 mg by mouth 4 (four) times daily.  08/11/19   Aldean Baker, NP  lisinopril (ZESTRIL) 20 MG tablet Take 1 tablet (20 mg total) by mouth daily. 08/12/19   Aldean Baker, NP  Multiple Vitamin (MULTIVITAMIN WITH MINERALS) TABS tablet Take 1 tablet by mouth daily. 08/12/19   Aldean Baker, NP  naproxen (NAPROSYN) 500 MG tablet Take 500 mg by mouth 2 (two) times daily with a meal.    [provider]  nicotine (NICODERM CQ - DOSED IN MG/24 HOURS) 21 mg/24hr patch Place 1 patch (21 mg total) onto the skin daily. 08/12/19   Aldean Baker, NP  OLANZapine (ZYPREXA) 10 MG tablet Take 1 tablet (10 mg total) by mouth at bedtime. Patient not taking: Reported on 02/06/2020 08/11/19   Aldean Baker, NP  OLANZapine (ZYPREXA) 15 MG tablet Take 15 mg by mouth at bedtime.    [provider]  Oxcarbazepine (TRILEPTAL) 300 MG tablet Take 1 tablet (300 mg total) by mouth 2 (two) times daily. 08/11/19   Aldean Baker, NP  traZODone (DESYREL) 100 MG tablet Take 100 mg by mouth at bedtime.    [provider]     Critical care time:      Dellia Cloud, D.O. [facility] Internal Medicine, PGY-1 Pager: [phone], Phone: [phone] Date 02/11/2020 Time 11:29 AM

## 2020-02-10 NOTE — Procedures (Signed)
Cortrak  Person Inserting Tube:  Osa Craver, RD Tube Type:  Cortrak - 43 inches Tube Location:  Left nare Initial Placement:  Stomach Secured by: Bridle Technique Used to Measure Tube Placement:  Documented cm marking at nare/ corner of mouth Cortrak Secured At:  62 cm    Cortrak Tube Team Note:  Consult received to place a Cortrak feeding tube.   No x-ray is required. RN may begin using tube.   If the tube becomes dislodged please keep the tube and contact the Cortrak team at [email or web address] ([email or web address]) for replacement.  If after hours and replacement cannot be delayed, place a NG tube and confirm placement with an abdominal x-ray.   Romelle Starcher MS, RDN, LDN, CNSC RD Pager Number and Weekend/On-Call After Hours Pager Located in [place]

## 2020-02-10 NOTE — Progress Notes (Signed)
NAME:  HASHIM EICHHORST, MRN:  [id number], DOB:  11-14-1965, LOS: 6 ADMISSION DATE:  02/04/2020, CONSULTATION DATE:  02/06/2020 REFERRING MD:  Dr. Myna Hidalgo, CHIEF COMPLAINT:  Hypoxia, AMS  Brief History   54 yoM presenting with 1 day hx of AMS found to be hypotensive and hypoxic with AKI.  Suspected sepsis, started on empiric abx coverage, CXR initially with mild bibasilar changes.  Since ongoing encephalopathy, still requiring NRB with increasing respiratory distress.  CXR now with worsening bilateral infiltrates, PCCM consulted.   History of present illness   HPI obtained from medical chart review as patient is encephalopathic.    54 year old male with prior history of tobacco, ETOH/ substance abuse (heroin), HTN, BPD, PTSD, SI in 10/21 who presented 4/13 with one day history of altered mental status and twitching.  Unclear last ETOH/ substance abuse.  Found hypotensive and hypoxic requiring NRB.    Workup notable for WBC 24, Na 126, K 5.4, BUN 91, sCR 6.35, elevated LFTs, PCT 28.77,  CXR with mild bibasilar infiltrates, SARS 2 neg, BNB 252, CK 3299,  ETOH neg, salicylates, UDS neg.  CT head normal.  Had urinary retention requiring foley insertion.  Nephrology consulted and given more fluids with improving AKI.  Ongoing altered mental status.  On CIWA needing ativan q 2-3 hours.  Worsening PaO2, remains on NRB.  Repeat CXR overnight with progressive diffuse bilateral airspace disease thought to represent worsening pneumonia +/- multifocal pneumonia.  Remains afebrile, blood pressure stable, improving WBC.  Was given 60mg  lasix with good UOP however still requiring NRB and increased work of breathing, therefore PCCM consulted for evaluation.   Past Medical History  Tobacco abuse, ETOH/ substance abuse (heroin), HTN, BPD, PTSD, SI in 10/21  Significant Hospital Events   4/13 Admit  4/15 Intubated   Consults:  Nephrology  Procedures:  4/13: Foley  4/15: ETT  Significant Diagnostic Tests:    4/13 [facility] >> neg  4/13 [place] abd complete >>  1. Hepatic steatosis. 2. Otherwise unremarkable exam.  EEG 4/15>> Abnormality -Triphasic waves, generalized -Continuous slow, generalized  IMPRESSION: This study is suggestive of moderate diffuse encephalopathy, nonspecific to etiology but could be secondary to toxic-metabolic causes.  No seizures or epileptiform discharges were seen throughout the recording.  MR Brain: 4/6 IMPRESSION: - Unremarkable non-contrast MRI appearance of the brain. No evidence of acute intracranial abnormality. - Paranasal sinus mucosal thickening with bilateral maxillary and sphenoid sinus air-fluid levels. Correlate for acute sinusitis. - Bilateral mastoid effusions.  Micro Data:  4/13 SARS 2/ Flu A/B >> Negative 4/13 BCx 2 >> NGTD 4/13 UC >> NGTD 4/14 MRSA PCR >> neg 4/15: Respiratory cultures >> Rare GPCs and Yeast  Antimicrobials:  4/13 vancomycin>> 4/13 4/13 ceftriaxone >> 4/14 azithromycin >>  Interim history/subjective:  Continues to fever overnight. Sedated on Vent this AM.   Objective   Blood pressure 101/73, pulse 70, temperature (!) 101.1 F (38.4 C), temperature source Oral, resp. rate 18, height 6' (1.829 m), weight 96.4 kg, SpO2 99 %.    Vent Mode: PRVC FiO2 (%):  [40 %] 40 % Set Rate:  [18 bmp] 18 bmp Vt Set:  [620 mL] 620 mL PEEP:  [5 cmH20-8 cmH20] 5 cmH20 Plateau Pressure:  [16 cmH20-28 cmH20] 23 cmH20   Intake/Output Summary (Last 24 hours) at 02/10/2020 0823 Last data filed at 02/10/2020 2426 Gross per 24 hour  Intake 3820.76 ml  Output 3980 ml  Net -159.24 ml   Filed Weights   02/08/20  0500 02/09/20 0500 02/10/20 0421  Weight: 94.9 kg 95 kg 96.4 kg   Examination: General: Intubated and sedated HENT: Normocephalic, atraumatic, moist mucus membranes Pulm: Good air movement with mechanical CV: RRR, systolic mumur, no rubs  Abdomen: Active bowel sounds, soft  Extremities: Pulses palpable, no LE edema  Skin: Warm and  dry  Neuro: Sedated  Resolved Hospital Problem list   AKI  Assessment & Plan:   ARDS 2/2 Bilateral PNA believed to be CAP vs Aspiration  - Fevering o/n, WBC now trending up 14 >> 17 - Resp Cultures growing few yeast (GPCs on Gram Stain) - Remains on MV with minimal vent settings (40%, PEEP 5)  - Urine strep and Leginella negative - Continue ceftriaxone and azithromycin (total of 7d) - Sedation, Fentanyl, Precedex, PRN Versed. RASS goal 0 to -1 - Net even yesterday, Plan to continue diuresis  Acute Encephalopathy, metabolic vs toxic: Remains agitated when sedation is weaned - Neurology following - Work up negative for toxic etiology - CT Head and MR Brain w/o Acute Abnormality - LP Cell Ct normal, No growth, HSV negative - Spot EEG negative, Continuous EEG ongoing - PAD: Precedex, Fentanyl. Clonidine Taper, Seroquel qhs - Continue thiamine  Hypernatremia:  - Na 146, will continue free water  Depression, PTSD, ?Bipolar - Continue Home trileptal  Best practice:  Diet: TFs Pain/Anxiety/Delirium protocol (if indicated): Precedex, Fentanyl VAP protocol (if indicated): Per protocol DVT prophylaxis: heparin SQ GI prophylaxis: Protonix Glucose control: SSI every 4 hours with tube feeds Mobility: BR Code Status: Full  Family Communication: Family Updated 4/18 by phone and bedside (Son and significant other) Disposition: ICU   Labs   CBC: Recent Labs  Lab 02/04/20 1813 02/05/20 0544 02/06/20 0327 02/06/20 0327 02/06/20 1212 02/07/20 0446 02/08/20 0436 02/09/20 0501 02/10/20 0420  WBC 24.4*   < > 16.6*  --   --  13.0* 13.8* 16.6* 17.1*  NEUTROABS 22.0*  --   --   --   --   --   --   --   --   HGB 14.2   < > 13.2   < > 12.2* 11.2* 10.8* 10.6* 10.0*  HCT 42.1   < > 38.9*   < > 36.0* 33.7* 33.3* 32.5* 31.8*  MCV 95.5   < > 93.5  --   --  95.7 96.5 97.6 100.6*  PLT 254   < > 242  --   --  233 226 205 191   < > = values in this interval not displayed.    Basic  Metabolic Panel: Recent Labs  Lab 02/05/20 0003 02/05/20 0544 02/06/20 0327 02/06/20 1212 02/07/20 0446 02/08/20 0436 02/09/20 0501 02/09/20 1526 02/10/20 0420  NA  --    < > 142   < > 147* 145 147* 148* 144  K  --    < > 4.3   < > 3.7 3.9 3.1* 4.3 3.9  CL  --    < > 102  --  104 109 115* 111 107  CO2  --    < > 26  --  31 28 25 27 25   GLUCOSE  --    < > 102*  --  101* 114* 123* 118* 130*  BUN  --    < > 34*  --  25* 20 23* 26* 28*  CREATININE  --    < > 1.21  --  1.07 1.00 0.83 1.03 1.07  CALCIUM  --    < >  8.8*  --  8.7* 8.1* 7.1* 8.1* 8.2*  MG 2.7*  --  2.2  --  2.0 2.0 1.6*  --  2.3  PHOS 6.7*  --  2.2*  --  1.9* 4.6  --   --  4.5   < > = values in this interval not displayed.   GFR: Estimated Creatinine Clearance: 98.3 mL/min (by C-G formula based on SCr of 1.07 mg/dL). Recent Labs  Lab 02/04/20 1813 02/04/20 2015 02/05/20 0003 02/05/20 0544 02/07/20 0446 02/08/20 0436 02/09/20 0501 02/10/20 0420  PROCALCITON  --   --  28.77  --  3.18 2.01  --   --   WBC 24.4*  --   --    < > 13.0* 13.8* 16.6* 17.1*  LATICACIDVEN 1.1 0.8  --   --   --   --   --   --    < > = values in this interval not displayed.    Liver Function Tests: Recent Labs  Lab 02/04/20 1813 02/05/20 0544 02/06/20 0327 02/07/20 0446 02/09/20 0501  AST 103* 75* 62*  --  31  ALT 63* 54* 54*  --  37  ALKPHOS 110 94 118  --  75  BILITOT 1.1 1.1 1.4*  --  1.0  PROT 7.8 6.0* 6.7  --  4.8*  ALBUMIN 3.5 2.4* 2.5* 2.0* 1.7*   Recent Labs  Lab 02/04/20 1813  LIPASE 14   Recent Labs  Lab 02/04/20 1813  AMMONIA 9    ABG    Component Value Date/Time   PHART 7.438 02/06/2020 1212   PCO2ART 56.8 (H) 02/06/2020 1212   PO2ART 431.0 (H) 02/06/2020 1212   HCO3 38.2 (H) 02/06/2020 1212   TCO2 40 (H) 02/06/2020 1212   ACIDBASEDEF 7.9 (H) 02/05/2020 0622   O2SAT 100.0 02/06/2020 1212     Coagulation Profile: Recent Labs  Lab 02/04/20 1813  INR 1.2    Cardiac Enzymes: Recent Labs  Lab  02/05/20 0544 02/09/20 0935  CKTOTAL 1,445* 148    HbA1C: Hgb A1c MFr Bld  Date/Time Value Ref Range Status  02/07/2020 04:46 AM 5.6 4.8 - 5.6 % Final    Comment:    (NOTE) Pre diabetes:          5.7%-6.4% Diabetes:              >6.4% Glycemic control for   <7.0% adults with diabetes   08/04/2019 06:05 PM 5.1 4.8 - 5.6 % Final    Comment:    (NOTE) Pre diabetes:          5.7%-6.4% Diabetes:              >6.4% Glycemic control for   <7.0% adults with diabetes     CBG: Recent Labs  Lab 02/09/20 1158 02/09/20 1531 02/09/20 2027 02/10/20 0023 02/10/20 0445  GLUCAP 127* 102* 131* 122* 129*    Review of Systems:   Unable to obtain due to critical illness.   Past Medical History  He,  has a past medical history of Anxiety, Depression, and Hypertension.   Surgical History   No past surgical history on file.   Social History   reports that he has been smoking cigarettes. He has been smoking about 1.00 pack per day. He has never used smokeless tobacco. He reports current alcohol use. He reports current drug use. Drug: Heroin.   Family History   His family history includes Hypertension in his father and mother.   Allergies  No Known Allergies   Home Medications  Prior to Admission medications   Medication Sig Start Date End Date Taking? Authorizing Provider  acamprosate (CAMPRAL) 333 MG tablet Take 2 tablets (666 mg total) by mouth 3 (three) times daily. 08/11/19   Aldean Baker, NP  amLODipine (NORVASC) 5 MG tablet Take 1 tablet (5 mg total) by mouth daily. 08/12/19   Aldean Baker, NP  DULoxetine (CYMBALTA) 30 MG capsule Take 30 mg by mouth daily.    [provider]  DULoxetine (CYMBALTA) 60 MG capsule Take 1 capsule (60 mg total) by mouth daily. Patient not taking: Reported on 02/06/2020 08/12/19   Aldean Baker, NP  gabapentin (NEURONTIN) 400 MG capsule Take 1 capsule (400 mg total) by mouth 3 (three) times daily. Patient taking differently: Take  400 mg by mouth 4 (four) times daily.  08/11/19   Aldean Baker, NP  lisinopril (ZESTRIL) 20 MG tablet Take 1 tablet (20 mg total) by mouth daily. 08/12/19   Aldean Baker, NP  Multiple Vitamin (MULTIVITAMIN WITH MINERALS) TABS tablet Take 1 tablet by mouth daily. 08/12/19   Aldean Baker, NP  naproxen (NAPROSYN) 500 MG tablet Take 500 mg by mouth 2 (two) times daily with a meal.    [provider]  nicotine (NICODERM CQ - DOSED IN MG/24 HOURS) 21 mg/24hr patch Place 1 patch (21 mg total) onto the skin daily. 08/12/19   Aldean Baker, NP  OLANZapine (ZYPREXA) 10 MG tablet Take 1 tablet (10 mg total) by mouth at bedtime. Patient not taking: Reported on 02/06/2020 08/11/19   Aldean Baker, NP  OLANZapine (ZYPREXA) 15 MG tablet Take 15 mg by mouth at bedtime.    [provider]  Oxcarbazepine (TRILEPTAL) 300 MG tablet Take 1 tablet (300 mg total) by mouth 2 (two) times daily. 08/11/19   Aldean Baker, NP  traZODone (DESYREL) 100 MG tablet Take 100 mg by mouth at bedtime.    [provider]       Ginette Otto, DO IM PGY-3 Pager: [phone]

## 2020-02-11 DIAGNOSIS — G92 Toxic encephalopathy: Secondary | ICD-10-CM

## 2020-02-11 LAB — CULTURE, RESPIRATORY W GRAM STAIN

## 2020-02-11 LAB — GLUCOSE, CAPILLARY
Glucose-Capillary: 106 mg/dL — ABNORMAL HIGH (ref 70–99)
Glucose-Capillary: 104 mg/dL — ABNORMAL HIGH (ref 70–99)
Glucose-Capillary: 102 mg/dL — ABNORMAL HIGH (ref 70–99)
Glucose-Capillary: 120 mg/dL — ABNORMAL HIGH (ref 70–99)
Glucose-Capillary: 124 mg/dL — ABNORMAL HIGH (ref 70–99)
Glucose-Capillary: 85 mg/dL (ref 70–99)
Glucose-Capillary: 104 mg/dL — ABNORMAL HIGH (ref 70–99)

## 2020-02-11 LAB — CBC
HCT: 33.4 % — ABNORMAL LOW (ref 39.0–52.0)
Hemoglobin: 10.8 g/dL — ABNORMAL LOW (ref 13.0–17.0)
MCH: 32.3 pg (ref 26.0–34.0)
MCHC: 32.3 g/dL (ref 30.0–36.0)
MCV: 100 fL (ref 80.0–100.0)
Platelets: 195 10*3/uL (ref 150–400)
RBC: 3.34 MIL/uL — ABNORMAL LOW (ref 4.22–5.81)
RDW: 14.3 % (ref 11.5–15.5)
WBC: 16.4 10*3/uL — ABNORMAL HIGH (ref 4.0–10.5)
nRBC: 0 % (ref 0.0–0.2)

## 2020-02-11 LAB — BASIC METABOLIC PANEL
Anion gap: 13 (ref 5–15)
BUN: 24 mg/dL — ABNORMAL HIGH (ref 6–20)
CO2: 26 mmol/L (ref 22–32)
Calcium: 8.4 mg/dL — ABNORMAL LOW (ref 8.9–10.3)
Chloride: 107 mmol/L (ref 98–111)
Creatinine, Ser: 0.94 mg/dL (ref 0.61–1.24)
GFR calc Af Amer: >60 mL/min (ref >60)
GFR calc non Af Amer: >60 mL/min (ref >60)
Glucose, Bld: 111 mg/dL — ABNORMAL HIGH (ref 70–99)
Potassium: 3.3 mmol/L — ABNORMAL LOW (ref 3.5–5.1)
Sodium: 146 mmol/L — ABNORMAL HIGH (ref 135–145)

## 2020-02-11 LAB — MAGNESIUM: Magnesium: 2.2 mg/dL (ref 1.7–2.4)

## 2020-02-11 LAB — ETHYLENE GLYCOL: Ethylene Glycol Lvl: <5 mg/dL (ref <5)

## 2020-02-11 MED ORDER — PANTOPRAZOLE SODIUM 40 MG PO PACK
40.0000 mg | PACK | Freq: Every day | ORAL | Status: DC
  Administered 2020-02-11 – 2020-02-13 (×3): 40 mg via ORAL
  Filled 2020-02-11 (×3): qty 20

## 2020-02-11 MED ORDER — CLONIDINE HCL 0.2 MG PO TABS: 0.1000 mg | ORAL_TABLET | Freq: Four times a day (QID) | ORAL | Status: DC

## 2020-02-11 MED ORDER — FOLIC ACID 1 MG PO TABS
1.0000 mg | ORAL_TABLET | Freq: Every day | ORAL | Status: DC
  Administered 2020-02-11 – 2020-02-13 (×3): 1 mg via ORAL
  Filled 2020-02-11 (×3): qty 1

## 2020-02-11 MED ORDER — RESOURCE THICKENUP CLEAR PO POWD
ORAL | Status: DC | PRN
  Filled 2020-02-11 (×2): qty 125

## 2020-02-11 MED ORDER — THIAMINE HCL 100 MG PO TABS
100.0000 mg | ORAL_TABLET | Freq: Every day | ORAL | Status: DC
  Administered 2020-02-11 – 2020-02-13 (×3): 100 mg via ORAL
  Filled 2020-02-11 (×4): qty 1

## 2020-02-11 MED ORDER — POTASSIUM CHLORIDE 20 MEQ/15ML (10%) PO SOLN
20.0000 meq | ORAL | Status: AC
  Administered 2020-02-11: 10:00:00 20 meq via ORAL
  Filled 2020-02-11: qty 15

## 2020-02-11 MED ORDER — CLONIDINE HCL 0.2 MG PO TABS: 0.1000 mg | ORAL_TABLET | Freq: Two times a day (BID) | ORAL | Status: DC

## 2020-02-11 MED ORDER — CLONIDINE HCL 0.2 MG PO TABS
0.2000 mg | ORAL_TABLET | Freq: Four times a day (QID) | ORAL | Status: AC
  Administered 2020-02-11 – 2020-02-12 (×3): 0.2 mg via ORAL
  Filled 2020-02-11 (×3): qty 1

## 2020-02-11 MED ORDER — POLYETHYLENE GLYCOL 3350 17 G PO PACK
17.0000 g | PACK | Freq: Every day | ORAL | Status: DC
  Administered 2020-02-11 – 2020-02-13 (×3): 17 g via ORAL
  Filled 2020-02-11 (×2): qty 1

## 2020-02-11 MED ORDER — CLONIDINE HCL 0.2 MG PO TABS: 0.1000 mg | ORAL_TABLET | ORAL | Status: DC

## 2020-02-11 MED ORDER — CLONIDINE HCL 0.1 MG PO TABS
0.1000 mg | ORAL_TABLET | Freq: Four times a day (QID) | ORAL | Status: AC
  Administered 2020-02-12 – 2020-02-13 (×4): 0.1 mg via ORAL
  Filled 2020-02-11 (×4): qty 1

## 2020-02-11 MED ORDER — CLONIDINE HCL 0.1 MG PO TABS
0.1000 mg | ORAL_TABLET | Freq: Two times a day (BID) | ORAL | Status: DC
  Administered 2020-02-13: 0.1 mg via ORAL
  Filled 2020-02-11: qty 1

## 2020-02-11 MED ORDER — SENNA 8.6 MG PO TABS: 1.0000 | ORAL_TABLET | Freq: Every day | ORAL | Status: DC | PRN

## 2020-02-11 MED ORDER — QUETIAPINE FUMARATE 100 MG PO TABS
100.0000 mg | ORAL_TABLET | Freq: Every day | ORAL | Status: DC
  Administered 2020-02-11 – 2020-02-12 (×2): 100 mg via ORAL
  Filled 2020-02-11 (×2): qty 1

## 2020-02-11 MED ORDER — CLONIDINE HCL 0.1 MG PO TABS: 0.1000 mg | ORAL_TABLET | ORAL | Status: DC

## 2020-02-11 MED ORDER — DOCUSATE SODIUM 50 MG/5ML PO LIQD
100.0000 mg | Freq: Two times a day (BID) | ORAL | Status: DC
  Administered 2020-02-11 – 2020-02-12 (×4): 100 mg via ORAL
  Filled 2020-02-11 (×4): qty 10

## 2020-02-11 MED ORDER — FREE WATER: 300.0000 mL | Status: DC

## 2020-02-11 MED ORDER — OXCARBAZEPINE 300 MG/5ML PO SUSP
300.0000 mg | Freq: Two times a day (BID) | ORAL | Status: DC
  Administered 2020-02-11 – 2020-02-13 (×5): 300 mg via ORAL
  Filled 2020-02-11 (×6): qty 5

## 2020-02-11 MED ORDER — CLONIDINE HCL 0.2 MG PO TABS
0.2000 mg | ORAL_TABLET | Freq: Four times a day (QID) | ORAL | Status: DC
  Administered 2020-02-11: 0.2 mg via ORAL

## 2020-02-11 MED ORDER — ADULT MULTIVITAMIN W/MINERALS CH
1.0000 | ORAL_TABLET | Freq: Every day | ORAL | Status: DC
  Administered 2020-02-11 – 2020-02-13 (×3): 1 via ORAL
  Filled 2020-02-11 (×3): qty 1

## 2020-02-11 MED ORDER — POTASSIUM CHLORIDE 20 MEQ/15ML (10%) PO SOLN
20.0000 meq | ORAL | Status: DC
  Administered 2020-02-11: 07:00:00 20 meq
  Filled 2020-02-11: qty 15

## 2020-02-11 MED ORDER — CLONIDINE HCL 0.2 MG PO TABS
0.2000 mg | ORAL_TABLET | Freq: Four times a day (QID) | ORAL | Status: DC
  Filled 2020-02-11: qty 1

## 2020-02-11 NOTE — Progress Notes (Signed)
Pt transferred to 3E18; VSS at time of transfer. Pt denies complaints at this time. Report given to [facility], Charity fundraiser. RN at bedside to receive patient.

## 2020-02-11 NOTE — Progress Notes (Signed)
I spoke to [facility] about this patient. They have agreed to take over care of this patient when he is transferred to progressive unit.    Dellia Cloud, D.O. [facility], PGY-1 Pager: [phone], Phone: [phone] Date 02/11/2020 Time 1:43 PM

## 2020-02-11 NOTE — Progress Notes (Signed)
Neurology Progress Note   S:// Patient seen and examined No acute events overnight-was extubated and is comfortably sitting in bed   O:// Current vital signs: BP 103/79 (BP Location: Right Arm)   Pulse 84   Temp 97.7 F (36.5 C) (Oral)   Resp (!) 25   Ht 6' (1.829 m)   Wt 90.9 kg   SpO2 97%   BMI 27.18 kg/m  Vital signs in last 24 hours: Temp:  [97.4 F (36.3 C)-98.6 F (37 C)] 97.7 F (36.5 C) (04/20 0745) Pulse Rate:  [77-114] 84 (04/20 0800) Resp:  [22-37] 25 (04/20 0800) BP: (100-142)/(61-89) 103/79 (04/20 0800) SpO2:  [84 %-100 %] 97 % (04/20 0800) FiO2 (%):  [40 %] 40 % (04/19 1500) Weight:  [90.9 kg] 90.9 kg (04/20 0408) Awake alert in no distress Normocephalic atraumatic Lungs clear Cardiovascular: Regular rate rhythm Abdomen soft nondistended nontender Neurological exam Awake alert oriented x2. Told me he is at [facility] but on redirection was able to tell me that he was at [facility] in [facility]. Speech is mildly dysarthric No evidence of aphasia Poor attention concentration 5/5 in both upper and lower extremities Intact sensation No dysmetria. No asterixis   Medications  Current Facility-Administered Medications:  .  0.9 %  sodium chloride infusion, , Intravenous, PRN, Rigoberto Noel, MD, Last Rate: 10 mL/hr at 02/11/20 0700, Rate Verify at 02/11/20 0700 .  acetaminophen (TYLENOL) suppository 650 mg, 650 mg, Rectal, Q6H PRN, 650 mg at 02/10/20 1039 **OR** acetaminophen (TYLENOL) tablet 650 mg, 650 mg, Oral, Q6H PRN, Margaretha Seeds, MD .  azithromycin (ZITHROMAX) 500 mg in sodium chloride 0.9 % 250 mL IVPB, 500 mg, Intravenous, Q24H, Neva Seat, MD, Stopped at 02/10/20 1827 .  cefTRIAXone (ROCEPHIN) 2 g in sodium chloride 0.9 % 100 mL IVPB, 2 g, Intravenous, Q24H, Neva Seat, MD, Stopped at 02/10/20 1754 .  chlorhexidine (PERIDEX) 0.12 % solution 15 mL, 15 mL, Mouth Rinse, BID, Agarwala, Ravi, MD, 15 mL  at 02/11/20 1000 .  Chlorhexidine Gluconate Cloth 2 % PADS 6 each, 6 each, Topical, Daily, Collene Gobble, MD, 6 each at 02/10/20 2117 .  cloNIDine (CATAPRES) tablet 0.2 mg, 0.2 mg, Oral, Q6H, 0.2 mg at 02/11/20 1000 **FOLLOWED BY** [START ON 02/12/2020] cloNIDine (CATAPRES) tablet 0.1 mg, 0.1 mg, Oral, Q6H **FOLLOWED BY** [START ON 02/13/2020] cloNIDine (CATAPRES) tablet 0.1 mg, 0.1 mg, Oral, Q12H **FOLLOWED BY** [START ON 02/14/2020] cloNIDine (CATAPRES) tablet 0.1 mg, 0.1 mg, Oral, Q24H, Agarwala, Ravi, MD .  docusate (COLACE) 50 MG/5ML liquid 100 mg, 100 mg, Oral, BID, Agarwala, Ravi, MD, 100 mg at 02/11/20 1000 .  feeding supplement (VITAL 1.5 CAL) liquid 1,000 mL, 1,000 mL, Per Tube, Q24H, Rigoberto Noel, MD, Last Rate: 20 mL/hr at 02/10/20 1518, 1,000 mL at 02/10/20 1518 .  fentaNYL (SUBLIMAZE) injection 50 mcg, 50 mcg, Intravenous, Q15 min PRN, Rigoberto Noel, MD .  folic acid (FOLVITE) tablet 1 mg, 1 mg, Oral, Daily, Agarwala, Ravi, MD, 1 mg at 02/11/20 1000 .  free water 300 mL, 300 mL, Oral, Q4H, Agarwala, Ravi, MD .  haloperidol lactate (HALDOL) injection 5 mg, 5 mg, Intravenous, Q6H PRN, Neva Seat, MD .  heparin injection 5,000 Units, 5,000 Units, Subcutaneous, Q8H, Etta Quill, DO, 5,000 Units at 02/11/20 [phone]  insulin aspart (novoLOG) injection 0-9 Units, 0-9 Units, Subcutaneous, Q4H, Helberg, Justin, MD, 1 Units at 02/10/20 0500 .  MEDLINE mouth rinse, 15 mL, Mouth Rinse, q12n4p,  Lynnell Catalan, MD .  multivitamin with minerals tablet 1 tablet, 1 tablet, Oral, Daily, Agarwala, Ravi, MD, 1 tablet at 02/11/20 1001 .  OXcarbazepine (TRILEPTAL) 300 MG/5ML suspension 300 mg, 300 mg, Oral, BID, Agarwala, Ravi, MD, 300 mg at 02/11/20 1006 .  pantoprazole sodium (PROTONIX) 40 mg/20 mL oral suspension 40 mg, 40 mg, Oral, Daily, Agarwala, Ravi, MD, 40 mg at 02/11/20 1009 .  polyethylene glycol (MIRALAX / GLYCOLAX) packet 17 g, 17 g, Oral, Daily, Agarwala, Ravi, MD, 17 g at 02/11/20  1001 .  QUEtiapine (SEROQUEL) tablet 100 mg, 100 mg, Oral, QHS, Agarwala, Ravi, MD .  Resource ThickenUp Clear, , Oral, PRN, Lynnell Catalan, MD .  senna (SENOKOT) tablet 8.6 mg, 1 tablet, Oral, Daily PRN, Agarwala, Ravi, MD .  sodium chloride flush (NS) 0.9 % injection 10-40 mL, 10-40 mL, Intracatheter, Q12H, Oretha Milch, MD, 10 mL at 02/11/20 1001 .  sodium chloride flush (NS) 0.9 % injection 10-40 mL, 10-40 mL, Intracatheter, PRN, Oretha Milch, MD .  thiamine tablet 100 mg, 100 mg, Oral, Daily, Agarwala, Ravi, MD, 100 mg at 02/11/20 1005 Labs CBC    Component Value Date/Time   WBC 16.4 (H) 02/11/2020 0406   RBC 3.34 (L) 02/11/2020 0406   HGB 10.8 (L) 02/11/2020 0406   HCT 33.4 (L) 02/11/2020 0406   PLT 195 02/11/2020 0406   MCV 100.0 02/11/2020 0406   MCH 32.3 02/11/2020 0406   MCHC 32.3 02/11/2020 0406   RDW 14.3 02/11/2020 0406   LYMPHSABS 1.9 02/10/2020 0420   MONOABS 1.0 02/10/2020 0420   EOSABS 0.5 02/10/2020 0420   BASOSABS 0.1 02/10/2020 0420    CMP     Component Value Date/Time   NA 146 (H) 02/11/2020 0406   K 3.3 (L) 02/11/2020 0406   CL 107 02/11/2020 0406   CO2 26 02/11/2020 0406   GLUCOSE 111 (H) 02/11/2020 0406   BUN 24 (H) 02/11/2020 0406   CREATININE 0.94 02/11/2020 0406   CALCIUM 8.4 (L) 02/11/2020 0406   PROT 4.8 (L) 02/09/2020 0501   ALBUMIN 1.7 (L) 02/09/2020 0501   AST 31 02/09/2020 0501   ALT 37 02/09/2020 0501   ALKPHOS 75 02/09/2020 0501   BILITOT 1.0 02/09/2020 0501   GFRNONAA >60 02/11/2020 0406   GFRAA >60 02/11/2020 0406    glycosylated hemoglobin  Lipid Panel     Component Value Date/Time   CHOL 263 (H) 08/07/2019 0647   TRIG 786 (H) 02/06/2020 1208   HDL 37 (L) 08/07/2019 0647   CHOLHDL 7.1 08/07/2019 0647   VLDL 48 (H) 08/07/2019 0647   LDLCALC 178 (H) 08/07/2019 0647     Imaging I have reviewed images in epic and the results pertinent to this consultation are: MRI-no acute changes LTM EEG-continue triphasics and  continuous slowing.  Assessment: 54 year old presenting altered mental status due to acute renal failure. Renal function improved but continued to be encephalopathic. Had sedation on board while intubated. Yesterday was extubated and sedation withdrawn. Since then has been awake alert and cooperative with the exam following all commands. I suspect he had prolonged encephalopathy from the acute renal failure and effect of the sedatives that were used to support his intubation.   Impression: Multifactorial toxic metabolic encephalopathy  Recommendations: Supportive treatment per primary team as you are No further neurological intervention at this time Please call with questions as needed Discussed on the unit with the rounding team and the attending Dr. Denese Killings  -- Milon Dikes, MD [facility]  Pager: [phone] If 7pm to 7am, please call on call as listed on AMION.

## 2020-02-11 NOTE — Progress Notes (Signed)
Physical Therapy Evaluation Patient Details Name: Donald Carroll MRN: [id number] DOB: 01-26-1966 Today's Date: 02/11/2020   History of Present Illness  54 year old male with history of ETOH/substance abuse, HTN, PTSD, suicidal ideation in 10/20, came in with altered mental status and increased twitching. Per chart was found to be significantly hypotensive and with acute kidney injury. MRI unremarkable non-contrast MRI appearance of the brain. No evidence of acute intracranial abnormality. Intubated 4/14-4/19.   Clinical Impression  Pt admitted with/for AMS and complications stated above.  Pt presently needing min to mod assist for mobility up OOB/gait and he is not back at baseline mentation.  Pt currently limited functionally due to the problems listed below.  (see problems list.)  Pt will benefit from PT to maximize function and safety to be able to get home safely with available assist.     Follow Up Recommendations Home health PT;Supervision/Assistance - 24 hour    Equipment Recommendations  Other (comment)(TBA)    Recommendations for Other Services       Precautions / Restrictions Precautions Precautions: Fall      Mobility  Bed Mobility Overal bed mobility: Needs Assistance Bed Mobility: Supine to Sit     Supine to sit: Min guard     General bed mobility comments: extra time to clear covers and scoot to the edge  Transfers Overall transfer level: Needs assistance Equipment used: None Transfers: Sit to/from Stand Sit to Stand: Min assist         General transfer comment: cues for hand placement to stabilze.  Stability assist.  Ambulation/Gait Ambulation/Gait assistance: Mod assist;Min assist(mod HHA, min with RW) Gait Distance (Feet): 16 Feet Assistive device: Rolling walker (2 wheeled);1 person hand held assist Gait Pattern/deviations: Step-to pattern;Step-through pattern;Decreased stride length Gait velocity: slow   General Gait Details: moderate  instability with weakness, listing to the Left and guarded gait  Stairs            Wheelchair Mobility    Modified Rankin (Stroke Patients Only)       Balance Overall balance assessment: Needs assistance Sitting-balance support: Single extremity supported;No upper extremity supported Sitting balance-Leahy Scale: Fair     Standing balance support: Single extremity supported;Bilateral upper extremity supported Standing balance-Leahy Scale: Poor                               Pertinent Vitals/Pain Pain Assessment: No/denies pain Pain Intervention(s): Monitored during session    Home Living Family/patient expects to be discharged to:: Private residence Living Arrangements: Spouse/significant other Available Help at Discharge: Family;Available 24 hours/day Type of Home: House Home Access: Stairs to enter Entrance Stairs-Rails: Doctor, general practice of Steps: several Home Layout: One level Home Equipment: (TBA)      Prior Function Level of Independence: Independent               Hand Dominance        Extremity/Trunk Assessment   Upper Extremity Assessment Upper Extremity Assessment: Defer to OT evaluation    Lower Extremity Assessment Lower Extremity Assessment: Generalized weakness       Communication   Communication: No difficulties  Cognition Arousal/Alertness: Awake/alert Behavior During Therapy: Restless;WFL for tasks assessed/performed Overall Cognitive Status: Impaired/Different from baseline Area of Impairment: Orientation;Attention;Memory;Awareness;Problem solving;Safety/judgement                 Orientation Level: Situation;Time;Place Current Attention Level: Focused Memory: Decreased recall of precautions   Safety/Judgement: Decreased awareness  of safety;Decreased awareness of deficits Awareness: Intellectual Problem Solving: Slow processing;Requires verbal cues        General Comments General  comments (skin integrity, edema, etc.): vss    Exercises     Assessment/Plan    PT Assessment Patient needs continued PT services  PT Problem List Decreased strength;Decreased activity tolerance;Decreased balance;Decreased mobility;Decreased safety awareness       PT Treatment Interventions Gait training;Functional mobility training;Therapeutic activities;Balance training;Patient/family education;Stair training;DME instruction    PT Goals (Current goals can be found in the Care Plan section)  Acute Rehab PT Goals PT Goal Formulation: Patient unable to participate in goal setting Time For Goal Achievement: 02/25/20 Potential to Achieve Goals: Good    Frequency Min 3X/week   Barriers to discharge        Co-evaluation               AM-PAC PT "6 Clicks" Mobility  Outcome Measure Help needed turning from your back to your side while in a flat bed without using bedrails?: A Little Help needed moving from lying on your back to sitting on the side of a flat bed without using bedrails?: A Little Help needed moving to and from a bed to a chair (including a wheelchair)?: A Little Help needed standing up from a chair using your arms (e.g., wheelchair or bedside chair)?: A Little Help needed to walk in hospital room?: A Lot Help needed climbing 3-5 steps with a railing? : A Lot 6 Click Score: 16    End of Session   Activity Tolerance: Patient tolerated treatment well;Patient limited by fatigue Patient left: in chair;with call bell/phone within reach;with chair alarm set Nurse Communication: Mobility status PT Visit Diagnosis: Unsteadiness on feet (R26.81);Difficulty in walking, not elsewhere classified (R26.2)    Time: 6045-4098 PT Time Calculation (min) (ACUTE ONLY): 19 min   Charges:   PT Evaluation $PT Eval Moderate Complexity: 1 Mod          02/11/2020  Ginger Carne., PT Acute Rehabilitation Services [phone]  (pager) [phone]  (office)  Tessie Fass  Santonio Speakman 02/11/2020, 2:16 PM

## 2020-02-11 NOTE — Evaluation (Signed)
Clinical/Bedside Swallow Evaluation Patient Details  Name: Donald Carroll MRN: [id number] Date of Birth: Apr 13, 1966  Today's Date: 02/11/2020 Time: SLP Start Time (ACUTE ONLY): 4098 SLP Stop Time (ACUTE ONLY): 0916 SLP Time Calculation (min) (ACUTE ONLY): 23 min  Past Medical History:  Past Medical History:  Diagnosis Date  . Anxiety   . Depression   . Hypertension    Past Surgical History: No past surgical history on file. HPI:  54 year old male with history of ETOH/substance abuse, HTN, PTSD, suicidal ideation in 10/20, came in with altered mental status and increased twitching. Per chart was found to be significantly hypotensive and with acute kidney injury. MRI unremarkable non-contrast MRI appearance of the brain. No evidence of acute intracranial abnormality. Intubated 4/14-4/19.    Assessment / Plan / Recommendation Clinical Impression  Pt exhibits an acute reversible dysphagia secondary to 6 day intubation in setting of present altered mental status that should improve with increased time off sedation. Thin water resulted in immediate cough with larger bolus and throat clear following 1/2 teaspoon size trials. Puree and nectar thick liquid subjectively provided improved control and likely protection consistently over trials. Pt was alert, mildly confused and needs cues for attention to task and self feeding assist. Recommend pt initiate puree (dentures not present and uses when eating), nectar thick liquids, crush meds and full supervision. ST will follow.     SLP Visit Diagnosis: Dysphagia, unspecified (R13.10)    Aspiration Risk  Mild aspiration risk;Moderate aspiration risk    Diet Recommendation Dysphagia 1 (Puree);Nectar-thick liquid   Liquid Administration via: Cup;No straw Medication Administration: Crushed with puree Supervision: Patient able to self feed;Full supervision/cueing for compensatory strategies;Staff to assist with self feeding Compensations: Minimize  environmental distractions;Slow rate;Small sips/bites;Lingual sweep for clearance of pocketing Postural Changes: Seated upright at 90 degrees    Other  Recommendations Oral Care Recommendations: Oral care BID Other Recommendations: Order thickener from pharmacy   Follow up Recommendations Other (comment)(TBD)      Frequency and Duration min 2x/week  2 weeks       Prognosis Prognosis for Safe Diet Advancement: Good Barriers to Reach Goals: Other (Comment)(polysubstance abuse)      Swallow Study   General HPI: 54 year old male with history of ETOH/substance abuse, HTN, PTSD, suicidal ideation in 10/20, came in with altered mental status and increased twitching. Per chart was found to be significantly hypotensive and with acute kidney injury. MRI unremarkable non-contrast MRI appearance of the brain. No evidence of acute intracranial abnormality. Intubated 4/14-4/19.  Type of Study: Bedside Swallow Evaluation Previous Swallow Assessment: (none) Diet Prior to this Study: NPO;NG Tube Temperature Spikes Noted: No Respiratory Status: Nasal cannula History of Recent Intubation: Yes Length of Intubations (days): 6 days Date extubated: 02/10/20 Behavior/Cognition: Alert;Cooperative;Pleasant mood;Confused;Requires cueing Oral Cavity Assessment: Within Functional Limits Oral Care Completed by SLP: Yes Oral Cavity - Dentition: Edentulous;Dentures, not available Vision: Functional for self-feeding Self-Feeding Abilities: Needs assist Patient Positioning: Upright in bed Baseline Vocal Quality: Low vocal intensity Volitional Cough: Strong Volitional Swallow: Able to elicit    Oral/Motor/Sensory Function Overall Oral Motor/Sensory Function: Within functional limits   Ice Chips Ice chips: Impaired Presentation: Spoon Oral Phase Impairments: Other (comment)(rapid transit, decr manipulation)   Thin Liquid Thin Liquid: Impaired Presentation: Spoon Pharyngeal  Phase Impairments: Cough -  Immediate    Nectar Thick Nectar Thick Liquid: Impaired Oral Phase Impairments: Reduced lingual movement/coordination Oral phase functional implications: (labial residue) Pharyngeal Phase Impairments: Suspected delayed Swallow   Honey Thick Honey Thick  Liquid: Not tested   Puree Puree: Impaired Presentation: Spoon Oral Phase Functional Implications: Other (comment)(labial residue)   Solid     Solid: Not tested      Houston Siren 02/11/2020,9:43 AM  Orbie Pyo Colvin Caroli.Ed Risk analyst [phone] Office [phone]

## 2020-02-11 NOTE — Progress Notes (Signed)
[facility] ADULT ICU REPLACEMENT PROTOCOL FOR AM LAB REPLACEMENT ONLY  The patient does apply for the [facility] Adult ICU Electrolyte Replacment Protocol based on the criteria listed below:   1. Is GFR >/= 40 ml/min? Yes.    Patient's GFR today is >60 2. Is urine output >/= 0.5 ml/kg/hr for the last 6 hours? Yes.   Patient's UOP is 0.68 ml/kg/hr 3. Is BUN < 60 mg/dL? Yes.    Patient's BUN today is 24 4. Abnormal electrolyte(s): K+3.3 5. Ordered repletion with: protocol 6. If a panic level lab has been reported, has the CCM MD in charge been notified? Yes.  .   Physician:  Dr. Althia Forts, Lilia Argue 02/11/2020 6:01 AM

## 2020-02-12 LAB — CBC
HCT: 35.3 % — ABNORMAL LOW (ref 39.0–52.0)
Hemoglobin: 11.1 g/dL — ABNORMAL LOW (ref 13.0–17.0)
MCH: 31.4 pg (ref 26.0–34.0)
MCHC: 31.4 g/dL (ref 30.0–36.0)
MCV: 99.7 fL (ref 80.0–100.0)
Platelets: 223 10*3/uL (ref 150–400)
RBC: 3.54 MIL/uL — ABNORMAL LOW (ref 4.22–5.81)
RDW: 13.9 % (ref 11.5–15.5)
WBC: 13.9 10*3/uL — ABNORMAL HIGH (ref 4.0–10.5)
nRBC: 0 % (ref 0.0–0.2)

## 2020-02-12 LAB — ANAEROBIC CULTURE

## 2020-02-12 LAB — GLUCOSE, CAPILLARY
Glucose-Capillary: 115 mg/dL — ABNORMAL HIGH (ref 70–99)
Glucose-Capillary: 117 mg/dL — ABNORMAL HIGH (ref 70–99)
Glucose-Capillary: 89 mg/dL (ref 70–99)
Glucose-Capillary: 95 mg/dL (ref 70–99)
Glucose-Capillary: 95 mg/dL (ref 70–99)
Glucose-Capillary: 99 mg/dL (ref 70–99)

## 2020-02-12 LAB — BASIC METABOLIC PANEL
Anion gap: 10 (ref 5–15)
BUN: 24 mg/dL — ABNORMAL HIGH (ref 6–20)
CO2: 24 mmol/L (ref 22–32)
Calcium: 8.7 mg/dL — ABNORMAL LOW (ref 8.9–10.3)
Chloride: 109 mmol/L (ref 98–111)
Creatinine, Ser: 0.98 mg/dL (ref 0.61–1.24)
GFR calc Af Amer: >60 mL/min (ref >60)
GFR calc non Af Amer: >60 mL/min (ref >60)
Glucose, Bld: 111 mg/dL — ABNORMAL HIGH (ref 70–99)
Potassium: 4 mmol/L (ref 3.5–5.1)
Sodium: 143 mmol/L (ref 135–145)

## 2020-02-12 MED ORDER — ENSURE ENLIVE PO LIQD
237.0000 mL | Freq: Three times a day (TID) | ORAL | Status: DC
  Administered 2020-02-12 – 2020-02-13 (×3): 237 mL via ORAL

## 2020-02-12 NOTE — Progress Notes (Addendum)
PROGRESS NOTE    Donald SaxWarren W Defrank  ZOX:[id number] DOB: January 02, 1966 DOA: 02/04/2020 PCP: Patient, No Pcp Per     Brief Narrative:  Donald Carroll is a 54 year old male with past medical history significant for alcohol, substance abuse (heroin), hypertension, PTSD, tobacco abuse, suicidal ideation who presented to the hospital on 4/13 with altered mental status, twitching.  He was found to be hypotensive and hypoxic requiring none rebreather.  Due to elevated creatinine of 6.35, nephrology was consulted.  Due to increased work of breathing as well as concern for progressive bilateral airspace disease, PCCM was consulted.  Patient was intubated on scene, subsequently extubated 4/19.  [facility] on 4/21.  New events last 24 hours / Subjective: Has no acute complaints this morning, states that his breathing is stable.  Continues to be very weak.  Remains on 3 L nasal cannula O2.  He remains alert and oriented x3.  Assessment & Plan:   Principal Problem:   Severe sepsis with acute organ dysfunction [facility](HCC) Active Problems:   Alcohol dependence with alcohol-induced mood disorder (HCC)   Acute respiratory failure with hypoxia (HCC)   Acute kidney failure (HCC)   Sepsis associated hypotension (HCC)   Acute metabolic encephalopathy   Acute hypoxemic respiratory failure, ARDS -Extubated 4/19 -Currently remains on 3 L nasal cannula O2, continue to wean to room air as able  Severe sepsis secondary to bilateral pneumonia, CAP versus aspiration -Completed antibiotic treatment -WBC improving, afebrile   AKI -Appreciate nephrology, renal function improved after catheter placement and fluid challenge  Acute metabolic/toxic encephalopathy -Appreciate neurology, encephalopathy thought to be secondary to acute renal failure, effects of sedatives -Resolved, alert and oriented x3 today  History of substance, alcohol, tobacco abuse -Now off Precedex, continue  catapres taper  -Folic acid, thiamine  -Cessation counseling  Depression, PTSD -Continue Trileptal   DVT prophylaxis: Subcutaneous heparin Code Status: Full code Family Communication: No family at bedside Disposition Plan:   Status is: Inpatient  Remains inpatient appropriate because:Inpatient level of care appropriate due to severity of illness   Dispo: The patient is from: Home              Anticipated d/c is to: Home              Anticipated d/c date is: 1 day              Patient currently is not medically stable to d/c.  Remains on O2, oral intake not adequate yet, continue PT OT   Consultants:   Nephrology  PCCM  Neurology   Antimicrobials:  Anti-infectives (From admission, onward)   Start     Dose/Rate Route Frequency Ordered Stop   02/05/20 1800  cefTRIAXone (ROCEPHIN) 2 g in sodium chloride 0.9 % 100 mL IVPB     2 g 200 mL/hr over 30 Minutes Intravenous Every 24 hours 02/04/20 2151 02/11/20 1902   02/05/20 1800  azithromycin (ZITHROMAX) 500 mg in sodium chloride 0.9 % 250 mL IVPB     500 mg 250 mL/hr over 60 Minutes Intravenous Every 24 hours 02/04/20 2151 02/11/20 2001   02/04/20 2200  vancomycin (VANCOREADY) IVPB 1750 mg/350 mL     1,750 mg 175 mL/hr over 120 Minutes Intravenous STAT 02/04/20 2147 02/05/20 0200   02/04/20 2159  vancomycin variable dose per unstable renal function (pharmacist dosing)  Status:  Discontinued      Does not apply See admin instructions 02/04/20 2200 02/06/20 0636  02/04/20 2015  cefTRIAXone (ROCEPHIN) 1 g in sodium chloride 0.9 % 100 mL IVPB     1 g 200 mL/hr over 30 Minutes Intravenous  Once 02/04/20 2012 02/04/20 2143   02/04/20 2015  azithromycin (ZITHROMAX) 500 mg in sodium chloride 0.9 % 250 mL IVPB     500 mg 250 mL/hr over 60 Minutes Intravenous  Once 02/04/20 2012 02/04/20 2156        Objective: Vitals:   02/12/20 0428 02/12/20 0700 02/12/20 0800 02/12/20 1115  BP:  122/77 110/77   Pulse: 84 79 (!) 107    Resp: (!) 22 (!) 24 18   Temp:    98.2 F (36.8 C)  TempSrc:      SpO2: 98% 93% 98%   Weight:      Height:        Intake/Output Summary (Last 24 hours) at 02/12/2020 1313 Last data filed at 02/12/2020 0900 Gross per 24 hour  Intake 853.32 ml  Output 300 ml  Net 553.32 ml   Filed Weights   02/10/20 0421 02/11/20 0408 02/12/20 0424  Weight: 96.4 kg 90.9 kg 91.1 kg    Examination:  General exam: Appears calm and comfortable  Respiratory system: Clear to auscultation. Respiratory effort normal. No respiratory distress. No conversational dyspnea. On 3L [place] O2  Cardiovascular system: S1 & S2 heard, RRR. No murmurs. No pedal edema. Gastrointestinal system: Abdomen is nondistended, soft and nontender. Normal bowel sounds heard. Central nervous system: Alert and oriented. No focal neurological deficits. Speech clear.  Extremities: Symmetric in appearance  Skin: No rashes, lesions or ulcers on exposed skin  Psychiatry: Judgement and insight appear normal. Mood & affect appropriate.   Data Reviewed: I have personally reviewed following labs and imaging studies  CBC: Recent Labs  Lab 02/08/20 0436 02/09/20 0501 02/10/20 0420 02/11/20 0406 02/12/20 0806  WBC 13.8* 16.6* 17.0*  17.1* 16.4* 13.9*  NEUTROABS  --   --  12.7*  --   --   HGB 10.8* 10.6* 10.0*  10.0* 10.8* 11.1*  HCT 33.3* 32.5* 32.0*  31.8* 33.4* 35.3*  MCV 96.5 97.6 102.9*  100.6* 100.0 99.7  PLT 226 205 178  191 195 223   Basic Metabolic Panel: Recent Labs  Lab 02/06/20 0327 02/06/20 1212 02/07/20 0446 02/07/20 0446 02/08/20 0436 02/08/20 0436 02/09/20 0501 02/09/20 1526 02/10/20 0420 02/11/20 0406 02/12/20 0806  NA 142   < > 147*   < > 145   < > 147* 148* 144 146* 143  K 4.3   < > 3.7   < > 3.9   < > 3.1* 4.3 3.9 3.3* 4.0  CL 102  --  104   < > 109   < > 115* 111 107 107 109  CO2 26  --  31   < > 28   < > 25 27 25 26 24   GLUCOSE 102*  --  101*   < > 114*   < > 123* 118* 130* 111* 111*  BUN 34*   --  25*   < > 20   < > 23* 26* 28* 24* 24*  CREATININE 1.21  --  1.07   < > 1.00   < > 0.83 1.03 1.07 0.94 0.98  CALCIUM 8.8*  --  8.7*   < > 8.1*   < > 7.1* 8.1* 8.2* 8.4* 8.7*  MG 2.2  --  2.0  --  2.0  --  1.6*  --  2.3 2.2  --  PHOS 2.2*  --  1.9*  --  4.6  --   --   --  4.5  --   --    < > = values in this interval not displayed.   GFR: Estimated Creatinine Clearance: 97.9 mL/min (by C-G formula based on SCr of 0.98 mg/dL). Liver Function Tests: Recent Labs  Lab 02/06/20 0327 02/07/20 0446 02/09/20 0501  AST 62*  --  31  ALT 54*  --  37  ALKPHOS 118  --  75  BILITOT 1.4*  --  1.0  PROT 6.7  --  4.8*  ALBUMIN 2.5* 2.0* 1.7*   No results for input(s): LIPASE, AMYLASE in the last 168 hours. No results for input(s): AMMONIA in the last 168 hours. Coagulation Profile: No results for input(s): INR, PROTIME in the last 168 hours. Cardiac Enzymes: Recent Labs  Lab 02/09/20 0935  CKTOTAL 148   BNP (last 3 results) No results for input(s): PROBNP in the last 8760 hours. HbA1C: No results for input(s): HGBA1C in the last 72 hours. CBG: Recent Labs  Lab 02/11/20 2324 02/12/20 0042 02/12/20 0417 02/12/20 0753 02/12/20 1108  GLUCAP 104* 95 95 99 117*   Lipid Profile: No results for input(s): CHOL, HDL, LDLCALC, TRIG, CHOLHDL, LDLDIRECT in the last 72 hours. Thyroid Function Tests: No results for input(s): TSH, T4TOTAL, FREET4, T3FREE, THYROIDAB in the last 72 hours. Anemia Panel: No results for input(s): VITAMINB12, FOLATE, FERRITIN, TIBC, IRON, RETICCTPCT in the last 72 hours. Sepsis Labs: Recent Labs  Lab 02/07/20 0446 02/08/20 0436  PROCALCITON 3.18 2.01    Recent Results (from the past 240 hour(s))  Urine culture     Status: None   Collection Time: 02/04/20  5:59 PM   Specimen: Urine, Random  Result Value Ref Range Status   Specimen Description   Final    URINE, RANDOM Performed at [facility] [address] [place], [place]  [address]    Special Requests   Final    NONE Performed at [facility] [place] [address] [place], [place] [address]    Culture   Final    NO GROWTH Performed at [facility], [place] [address], [place], [place] [address]    Report Status 02/06/2020 FINAL  Final  Respiratory Panel by RT PCR (Flu A&B, Covid) - Nasopharyngeal Swab     Status: None   Collection Time: 02/04/20  6:13 PM   Specimen: Nasopharyngeal Swab  Result Value Ref Range Status   SARS Coronavirus 2 by RT PCR NEGATIVE NEGATIVE Final    Comment: (NOTE) SARS-CoV-2 target nucleic acids are NOT DETECTED. The SARS-CoV-2 RNA is generally detectable in upper respiratoy specimens during the acute phase of infection. The lowest concentration of SARS-CoV-2 viral copies this assay can detect is 131 copies/mL. A negative result does not preclude SARS-Cov-2 infection and should not be used as the sole basis for treatment or other patient management decisions. A negative result may occur with  improper specimen collection/handling, submission of specimen other than nasopharyngeal swab, presence of viral mutation(s) within the areas targeted by this assay, and inadequate number of viral copies (<131 copies/mL). A negative result must be combined with clinical observations, patient history, and epidemiological information. The expected result is Negative. Fact Sheet for Patients:  PinkCheek.be Fact Sheet for Healthcare Providers:  GravelBags.it This test is not yet ap proved or cleared by the [place] FDA and  has been authorized for detection and/or diagnosis of SARS-CoV-2  by FDA under an Emergency Use Authorization (EUA). This EUA will remain  in effect (meaning this test can be used) for the duration of the COVID-19 declaration under Section 564(b)(1) of the Act, 21 U.S.C. section 360bbb-3(b)(1), unless the authorization is terminated or revoked  sooner.    Influenza A by PCR NEGATIVE NEGATIVE Final   Influenza B by PCR NEGATIVE NEGATIVE Final    Comment: (NOTE) The Xpert Xpress SARS-CoV-2/FLU/RSV assay is intended as an aid in  the diagnosis of influenza from Nasopharyngeal swab specimens and  should not be used as a sole basis for treatment. Nasal washings and  aspirates are unacceptable for Xpert Xpress SARS-CoV-2/FLU/RSV  testing. Fact Sheet for Patients: https://www.moore.com/ Fact Sheet for Healthcare Providers: https://www.young.biz/ This test is not yet approved or cleared by the [place] FDA and  has been authorized for detection and/or diagnosis of SARS-CoV-2 by  FDA under an Emergency Use Authorization (EUA). This EUA will remain  in effect (meaning this test can be used) for the duration of the  Covid-19 declaration under Section 564(b)(1) of the Act, 21  U.S.C. section 360bbb-3(b)(1), unless the authorization is  terminated or revoked. Performed at [facility], [address] [place], [place] [address]   Blood culture (routine x 2)     Status: None   Collection Time: 02/04/20  8:15 PM   Specimen: BLOOD  Result Value Ref Range Status   Specimen Description   Final    BLOOD LEFT ANTECUBITAL Performed at [facility], [address], [place], [place] [address]    Special Requests   Final    BOTTLES DRAWN AEROBIC AND ANAEROBIC Blood Culture adequate volume Performed at [facility], [address], [place], [place] [address]    Culture   Final    NO GROWTH 5 DAYS Performed at [facility], [address], [place], [place] [address]    Report Status 02/10/2020 FINAL  Final  Blood culture (routine x 2)     Status: None   Collection Time: 02/04/20  8:15 PM   Specimen: BLOOD  Result Value Ref Range Status   Specimen Description   Final    BLOOD RIGHT ANTECUBITAL Performed at [facility], [address] [place], [place] [address]    Special Requests   Final    BOTTLES DRAWN AEROBIC AND ANAEROBIC Blood Culture adequate volume Performed at [facility], [address] [place], [place] [address]    Culture   Final    NO GROWTH 5 DAYS Performed at [facility], [address] [place], [place] [address]    Report Status 02/10/2020 FINAL  Final  MRSA PCR Screening     Status: None   Collection Time: 02/05/20 10:54 AM   Specimen: Nasal Mucosa; Nasopharyngeal  Result Value Ref Range Status   MRSA by PCR NEGATIVE NEGATIVE Final    Comment:        The GeneXpert MRSA Assay (FDA approved for NASAL specimens only), is one component of a comprehensive MRSA colonization surveillance program. It is not intended to diagnose MRSA infection nor to guide or monitor treatment for MRSA infections. Performed at [facility], [address], [place], [place] [address]   Culture, respiratory (non-expectorated)     Status: None   Collection Time: 02/06/20  3:14 PM   Specimen: Tracheal Aspirate; Respiratory  Result Value Ref Range Status   Specimen Description TRACHEAL ASPIRATE  Final   Special Requests NONE  Final   Gram Stain   Final    FEW WBC PRESENT, PREDOMINANTLY PMN RARE GRAM POSITIVE COCCI RARE YEAST Performed at [facility], [address] [place], [place] [address]    Culture FEW CANDIDA TROPICALIS  Final   Report Status 02/11/2020 FINAL  Final  CSF culture     Status: None   Collection Time: 02/07/20  4:30 PM   Specimen: CSF; Cerebrospinal Fluid  Result Value Ref Range Status   Specimen Description CSF  Final   Special Requests NONE  Final   Gram Stain NO WBC SEEN NO ORGANISMS SEEN   Final   Culture   Final    NO GROWTH 3 DAYS Performed at [facility], [address] [place], [place] [address]    Report Status 02/10/2020 FINAL  Final  Anaerobic culture     Status: None   Collection Time: 02/07/20   4:30 PM   Specimen: CSF; Cerebrospinal Fluid  Result Value Ref Range Status   Specimen Description CSF  Final   Special Requests NONE  Final   Culture   Final    NO ANAEROBES ISOLATED Performed at [facility], [address] [place], [place] [address]    Report Status 02/12/2020 FINAL  Final      Radiology Studies: No results found.    Scheduled Meds: . chlorhexidine  15 mL Mouth Rinse BID  . Chlorhexidine Gluconate Cloth  6 each Topical Daily  . cloNIDine  0.1 mg Oral Q6H   Followed by  . [START ON 02/13/2020] cloNIDine  0.1 mg Oral Q12H   Followed by  . [START ON 02/14/2020] cloNIDine  0.1 mg Oral Q24H  . docusate  100 mg Oral BID  . folic acid  1 mg Oral Daily  . heparin  5,000 Units Subcutaneous Q8H  . insulin aspart  0-9 Units Subcutaneous Q4H  . mouth rinse  15 mL Mouth Rinse q12n4p  . multivitamin with minerals  1 tablet Oral Daily  . OXcarbazepine  300 mg Oral BID  . pantoprazole sodium  40 mg Oral Daily  . polyethylene glycol  17 g Oral Daily  . QUEtiapine  100 mg Oral QHS  . sodium chloride flush  10-40 mL Intracatheter Q12H  . thiamine  100 mg Oral Daily   Continuous Infusions: . sodium chloride Stopped (02/11/20 2119)     LOS: 8 days      Time spent: 40 minutes   Noralee Stain, DO [facility] 02/12/2020, 1:13 PM   Available via Epic secure chat 7am-7pm After these hours, please refer to coverage provider listed on [email or web address]

## 2020-02-12 NOTE — Progress Notes (Signed)
   02/12/20 2200  Vitals  BP 107/75  MAP (mmHg) 85  Pulse Rate 95  ECG Heart Rate 98  Resp 20  Oxygen Therapy  SpO2 (!) 88 %  O2 Device Room Air  MEWS Score  MEWS Temp 0  MEWS Systolic 0  MEWS Pulse 0  MEWS RR 0  MEWS LOC 0  MEWS Score 0  MEWS Score Color Green  Patient sats drop when asleep. Will place back on 2L.

## 2020-02-12 NOTE — Progress Notes (Signed)
  Speech Language Pathology Treatment: Dysphagia  Patient Details Name: Donald Carroll MRN: [id number] DOB: 1966/04/13 Today's Date: 02/12/2020 Time: 4765-4650 SLP Time Calculation (min) (ACUTE ONLY): 15 min  Assessment / Plan / Recommendation Clinical Impression  Pt was seen for dysphagia treatment to assess improvement in swallow function. Pt's significant other, Marcelino Duster, reported that the pt does not like to food and has not been eating for this reason as well as the fact that he is "so full of pee". He tolerated individual sips of thin liquids via cup and straw without overt s/sx of aspiration. However, without consistent cues and removal of cup/straw, pt demonstrated up to five consecutive swallows and exhibited coughing thereafter, suggesting aspiration. Mastication time was prolonged with regular texture solids but pt's significant other indicated that the pt eats slowly even though he drinks very quickly. A dysphagia 3 diet is recommended at this time with nectar thick liquids and SLP will continue to follow for treatment.    HPI HPI: 54 year old male with history of ETOH/substance abuse, HTN, PTSD, suicidal ideation in 10/20, came in with altered mental status and increased twitching. Per chart was found to be significantly hypotensive and with acute kidney injury. MRI unremarkable non-contrast MRI appearance of the brain. No evidence of acute intracranial abnormality. Intubated 4/14-4/19.       SLP Plan          Recommendations  Diet recommendations: Dysphagia 3 (mechanical soft);Nectar-thick liquid Liquids provided via: Cup;No straw Medication Administration: Crushed with puree Supervision: Patient able to self feed Compensations: Minimize environmental distractions;Slow rate;Small sips/bites;Follow solids with liquid Postural Changes and/or Swallow Maneuvers: Seated upright 90 degrees                Oral Care Recommendations: Oral care BID Follow up Recommendations:  Other (comment)(TBD) SLP Visit Diagnosis: Dysphagia, unspecified (R13.10)       Kiasia Chou I. Vear Clock, MS, CCC-SLP Acute Rehabilitation Services Office number [phone] Pager [phone]                 Scheryl Marten 02/12/2020, 1:50 PM

## 2020-02-12 NOTE — Progress Notes (Signed)
   02/12/20 2103  Vitals  Pulse Rate 87  ECG Heart Rate 88  Resp (!) 22  Oxygen Therapy  SpO2 93 %  O2 Device Room Air  MEWS Score  MEWS Temp 0  MEWS Systolic 0  MEWS Pulse 0  MEWS RR 1  MEWS LOC 0  MEWS Score 1  MEWS Score Color Green  Patient weaned to room air. Tolerating well. Will continue to monitor.

## 2020-02-12 NOTE — Progress Notes (Signed)
Nutrition Follow-up  DOCUMENTATION CODES:   Not applicable  INTERVENTION:   -Ensure Enlive po TID, each supplement provides 350 kcal and 20 grams of  -MVI with minerals daily -Magic cup TID with meals, each supplement provides 290 kcal and 9 grams of protein  NUTRITION DIAGNOSIS:   Inadequate oral intake related to inability to eat as evidenced by NPO status.  Progressing; advanced to dysphagia 3 diet with nectar thick liquids  GOAL:   Patient will meet greater than or equal to 90% of their needs  Progressing   MONITOR:   PO intake, Supplement acceptance, Diet advancement, Weight trends, Labs, I & O's  REASON FOR ASSESSMENT:   Consult, Ventilator Enteral/tube feeding initiation and management  ASSESSMENT:   Pt with PMH of ETOH abuse (no recent use), substance abuse, and PTSD who was admitted 4/13 with AMS and dx with ARDS of unknown etiology and AKI.  4/16- extubated 4/19- s/p cotrak tube placement, stomach 4/20- s/p MBSS- dysphagia 1 diet with nectar thick liquids, cortrak removed 4/21- advanced to dysphagia 3 diet with nectar thick liquids  Pt resting quietly in bed at time of visit. Per staff report, pt is alert and oriented x 3 today. Meal completion documented at 0%, as pt's significant other shares that pt did not like the pureed diet. Hopeful intake will improve with diet advancement. Pt would greatly benefit from oral nutrition supplements to help meet needs.  Medications reviewed and include miralax.   Labs reviewed: CBGS: 95-117.   Diet Order:   Diet Order            DIET DYS 3 Room service appropriate? Yes with Assist; Fluid consistency: Nectar Thick  Diet effective now              EDUCATION NEEDS:   No education needs have been identified at this time  Skin:  Skin Assessment: Reviewed RN Assessment  Last BM:  02/05/20  Height:   Ht Readings from Last 1 Encounters:  02/05/20 6' (1.829 m)    Weight:   Wt Readings from Last 1  Encounters:  02/12/20 91.1 kg    Ideal Body Weight:  80.9 kg  BMI:  Body mass index is 27.24 kg/m.  Estimated Nutritional Needs:   Kcal:  2200-2400  Protein:  120-135 grams  Fluid:  > 2.2 L    Levada Schilling, RD, LDN, CDCES Registered Dietitian II Certified Diabetes Care and Education Specialist Please refer to [facility] for RD and/or RD on-call/weekend/after hours pager

## 2020-02-13 LAB — CBC
HCT: 35.2 % — ABNORMAL LOW (ref 39.0–52.0)
Hemoglobin: 11.5 g/dL — ABNORMAL LOW (ref 13.0–17.0)
MCH: 32.5 pg (ref 26.0–34.0)
MCHC: 32.7 g/dL (ref 30.0–36.0)
MCV: 99.4 fL (ref 80.0–100.0)
Platelets: 252 10*3/uL (ref 150–400)
RBC: 3.54 MIL/uL — ABNORMAL LOW (ref 4.22–5.81)
RDW: 13.8 % (ref 11.5–15.5)
WBC: 12.7 10*3/uL — ABNORMAL HIGH (ref 4.0–10.5)
nRBC: 0 % (ref 0.0–0.2)

## 2020-02-13 LAB — BASIC METABOLIC PANEL
Anion gap: 11 (ref 5–15)
BUN: 21 mg/dL — ABNORMAL HIGH (ref 6–20)
CO2: 21 mmol/L — ABNORMAL LOW (ref 22–32)
Calcium: 8.6 mg/dL — ABNORMAL LOW (ref 8.9–10.3)
Chloride: 111 mmol/L (ref 98–111)
Creatinine, Ser: 0.97 mg/dL (ref 0.61–1.24)
GFR calc Af Amer: >60 mL/min (ref >60)
GFR calc non Af Amer: >60 mL/min (ref >60)
Glucose, Bld: 93 mg/dL (ref 70–99)
Potassium: 3.7 mmol/L (ref 3.5–5.1)
Sodium: 143 mmol/L (ref 135–145)

## 2020-02-13 LAB — GLUCOSE, CAPILLARY
Glucose-Capillary: 82 mg/dL (ref 70–99)
Glucose-Capillary: 86 mg/dL (ref 70–99)

## 2020-02-13 MED ORDER — MAGNESIUM HYDROXIDE 400 MG/5ML PO SUSP: 15.0000 mL | Freq: Once | ORAL | Status: DC

## 2020-02-13 MED ORDER — SENNA 8.6 MG PO TABS: 1.0000 | ORAL_TABLET | Freq: Every day | ORAL | 0 refills | Status: DC | PRN

## 2020-02-13 MED ORDER — THIAMINE HCL 100 MG PO TABS: 100.0000 mg | ORAL_TABLET | Freq: Every day | ORAL | 0 refills | Status: DC

## 2020-02-13 MED ORDER — QUETIAPINE FUMARATE 100 MG PO TABS: 100.0000 mg | ORAL_TABLET | Freq: Every day | ORAL | 0 refills | Status: DC

## 2020-02-13 MED ORDER — DOCUSATE SODIUM 50 MG/5ML PO LIQD
100.0000 mg | Freq: Two times a day (BID) | ORAL | Status: DC
  Administered 2020-02-13: 100 mg via ORAL
  Filled 2020-02-13: qty 10

## 2020-02-13 MED ORDER — CLONIDINE HCL 0.1 MG PO TABS: 0.1000 mg | ORAL_TABLET | Freq: Two times a day (BID) | ORAL | 0 refills | Status: DC

## 2020-02-13 MED ORDER — FOLIC ACID 1 MG PO TABS: 1.0000 mg | ORAL_TABLET | Freq: Every day | ORAL | 0 refills | Status: DC

## 2020-02-13 NOTE — Progress Notes (Signed)
SATURATION QUALIFICATIONS: (This note is used to comply with regulatory documentation for home oxygen)  Patient Saturations on Room Air at Rest = 96%  Patient Saturations on Room Air while Ambulating = 93%  

## 2020-02-13 NOTE — Discharge Summary (Addendum)
Physician Discharge Summary  NEWMAN WAREN VEL:[id number] DOB: [birth year] DOA: 02/04/2020  PCP: Patient, No Pcp Per  Admit date: 02/04/2020 Discharge date: 02/13/2020  Admitted From: Home Disposition:  Home with home health services  Recommendations for Outpatient Follow-up:  1. Follow up with PCP in 1 week  Discharge Condition: Stable CODE STATUS: Full  Diet recommendation: Dysphagia 3 diet   Brief/Interim Summary: Donald Carroll is a 54 year old male with past medical history significant for alcohol, substance abuse (heroin), hypertension, PTSD, tobacco abuse, suicidal ideation who presented to the hospital on 4/13 with altered mental status, twitching.  He was found to be hypotensive and hypoxic requiring none rebreather.  Due to elevated creatinine of 6.35, nephrology was consulted.  Due to increased work of breathing as well as concern for progressive bilateral airspace disease, PCCM was consulted.  Patient was intubated on scene, subsequently extubated 4/19.  He was transferred to [facility] service on 4/21. Patient's O2 requirement decreased and he was weaned to room air. His diet was advanced by SLP and he continued to make improvements with PT.   Discharge Diagnoses:  Principal Problem:   Severe sepsis with acute organ dysfunction [facility]) Active Problems:   Alcohol dependence with alcohol-induced mood disorder (HCC)   Acute respiratory failure with hypoxia (HCC)   Acute kidney failure (HCC)   Sepsis associated hypotension (HCC)   Acute metabolic encephalopathy   Acute hypoxemic respiratory failure, ARDS -Extubated 4/19 -On room air this morning, satting 98% without distress   Severe sepsis secondary to bilateral pneumonia, CAP versus aspiration -Completed antibiotic treatment -WBC improving, afebrile   AKI -Appreciate nephrology, renal function improved after catheter placement and fluid challenge  Acute metabolic/toxic encephalopathy -Appreciate  neurology, encephalopathy thought to be secondary to acute renal failure, effects of sedatives -Resolved, alert and oriented x3 today  History of substance, alcohol, tobacco abuse -Now off Precedex, continue catapres taper (last day 4/23)  -Folic acid, thiamine  -Cessation counseling  Depression, PTSD -Continue Trileptal, seroquel     Discharge Instructions  Discharge Instructions    Call MD for:  difficulty breathing, headache or visual disturbances   Complete by: As directed    Call MD for:  extreme fatigue   Complete by: As directed    Call MD for:  persistant dizziness or light-headedness   Complete by: As directed    Call MD for:  persistant nausea and vomiting   Complete by: As directed    Call MD for:  severe uncontrolled pain   Complete by: As directed    Call MD for:  temperature >100.4   Complete by: As directed    Discharge instructions   Complete by: As directed    You were cared for by a hospitalist during your hospital stay. If you have any questions about your discharge medications or the care you received while you were in the hospital after you are discharged, you can call the unit and ask to speak with the hospitalist on call if the hospitalist that took care of you is not available. Once you are discharged, your primary care physician will handle any further medical issues. Please note that NO REFILLS for any discharge medications will be authorized once you are discharged, as it is imperative that you return to your primary care physician (or establish a relationship with a primary care physician if you do not have one) for your aftercare needs so that they can reassess your need for medications and monitor your lab values.  Increase activity slowly   Complete by: As directed      Allergies as of 02/13/2020   No Known Allergies     Medication List    STOP taking these medications   acamprosate 333 MG tablet Commonly known as: CAMPRAL   amLODipine 5  MG tablet Commonly known as: NORVASC   DULoxetine 30 MG capsule Commonly known as: CYMBALTA   DULoxetine 60 MG capsule Commonly known as: CYMBALTA   gabapentin 400 MG capsule Commonly known as: NEURONTIN   lisinopril 20 MG tablet Commonly known as: ZESTRIL   naproxen 500 MG tablet Commonly known as: NAPROSYN   OLANZapine 10 MG tablet Commonly known as: ZYPREXA   OLANZapine 15 MG tablet Commonly known as: ZYPREXA   traZODone 100 MG tablet Commonly known as: DESYREL     TAKE these medications   cloNIDine 0.1 MG tablet Commonly known as: CATAPRES Take 1 tablet (0.1 mg total) by mouth 2 (two) times daily for 2 doses. Take one evening of 4/22, take one morning of 4/23   folic acid 1 MG tablet Commonly known as: FOLVITE Take 1 tablet (1 mg total) by mouth daily. Start taking on: February 14, 2020   multivitamin with minerals Tabs tablet Take 1 tablet by mouth daily.   nicotine 21 mg/24hr patch Commonly known as: NICODERM CQ - dosed in mg/24 hours Place 1 patch (21 mg total) onto the skin daily.   Oxcarbazepine 300 MG tablet Commonly known as: TRILEPTAL Take 1 tablet (300 mg total) by mouth 2 (two) times daily.   QUEtiapine 100 MG tablet Commonly known as: SEROQUEL Take 1 tablet (100 mg total) by mouth at bedtime.   senna 8.6 MG Tabs tablet Commonly known as: SENOKOT Take 1 tablet (8.6 mg total) by mouth daily as needed for mild constipation.   thiamine 100 MG tablet Take 1 tablet (100 mg total) by mouth daily. Start taking on: February 14, 2020            Durable Medical Equipment  (From admission, onward)         Start     Ordered   02/13/20 1610  For home use only DME Walker rolling  Once    Question Answer Comment  Walker: With 5 Inch Wheels   Patient needs a walker to treat with the following condition Physical deconditioning      02/13/20 0920         Follow-up Information    [facility]. Schedule an appointment  as soon as possible for a visit.   Contact information: [address] [place] [address] [phone]         No Known Allergies  Consultations:  Nephrology  PCCM  Neurology   Procedures/Studies: EEG  Result Date: 02/06/2020 Charlsie Quest, MD     02/06/2020 10:29 AM Patient Name: RANEN DOOLIN MRN: [id number] Epilepsy Attending: Charlsie Quest Referring Physician/Provider: Dr. Beola Cord Date: 02/06/2020 Duration: 23.54 minutes Patient history: 54 year old male with altered mental status.  EEG evaluate for seizures. Level of alertness: Awake AEDs during EEG study: Lorazepam Technical aspects: This EEG study was done with scalp electrodes positioned according to the 10-20 International system of electrode placement. Electrical activity was acquired at a sampling rate of 500Hz  and reviewed with a high frequency filter of 70Hz  and a low frequency filter of 1Hz . EEG data were recorded continuously and digitally stored. Description: During awake state, no clear posterior dominant rhythm was  seen. EEG showed continuous generalized 3 to 5 Hz theta-delta slowing.  Triphasic waves, generalized were also noted at times. Hyperventilation and photic stimulation were not performed. Abnormality -Triphasic waves, generalized -Continuous slow, generalized IMPRESSION: This study is suggestive of moderate diffuse encephalopathy, nonspecific to etiology but could be secondary to toxic-metabolic causes.  No seizures or epileptiform discharges were seen throughout the recording. Charlsie Quest   DG Abd 1 View  Result Date: 02/06/2020 CLINICAL DATA:  Bedside orogastric/nasogastric tube placement. EXAM: Portable ABDOMEN-1 VIEW COMPARISON:  None. FINDINGS: OG/NG tube courses through the stomach with its tip satisfactorily position at the pylorus or in the duodenal bulb. Mild gaseous distension of small bowel loops in the RIGHT mid abdomen, though the lower abdomen is not  included on the image. IMPRESSION: 1. OG/NG tube courses through the stomach with its tip satisfactorily positioned at the pylorus or in the duodenal bulb. 2. Mild small bowel ileus versus early partial small bowel obstruction. Electronically Signed   By: Hulan Saas M.D.   On: 02/06/2020 15:30   CT Head Wo Contrast  Result Date: 02/04/2020 CLINICAL DATA:  Encephalopathy EXAM: CT HEAD WITHOUT CONTRAST TECHNIQUE: Contiguous axial images were obtained from the base of the skull through the vertex without intravenous contrast. COMPARISON:  None. FINDINGS: Brain: There is no mass, hemorrhage or extra-axial collection. The size and configuration of the ventricles and extra-axial CSF spaces are normal. The brain parenchyma is normal, without acute or chronic infarction. Vascular: No abnormal hyperdensity of the major intracranial arteries or dural venous sinuses. No intracranial atherosclerosis. Skull: The visualized skull base, calvarium and extracranial soft tissues are normal. Sinuses/Orbits: No fluid levels or advanced mucosal thickening of the visualized paranasal sinuses. No mastoid or middle ear effusion. The orbits are normal. IMPRESSION: Normal head CT. Electronically Signed   By: Deatra Robinson M.D.   On: 02/04/2020 19:44   MR BRAIN WO CONTRAST  Result Date: 02/07/2020 CLINICAL DATA:  Encephalopathy. EXAM: MRI HEAD WITHOUT CONTRAST TECHNIQUE: Multiplanar, multiecho pulse sequences of the brain and surrounding structures were obtained without intravenous contrast. COMPARISON:  Head CT 02/04/2020 FINDINGS: Brain: There is no evidence of acute infarct. No evidence of intracranial mass. No midline shift or extra-axial fluid collection. No chronic intracranial blood products. No focal parenchymal signal abnormality is identified. Cerebral volume is normal for age. Vascular: Flow voids maintained within the proximal large arterial vessels. Non dominant intracranial right vertebral artery. Skull and upper  cervical spine: No focal marrow lesion. Sinuses/Orbits: Visualized orbits demonstrate no acute abnormality. Mild ethmoid, maxillary and sphenoid sinus mucosal thickening. Air-fluid levels within the bilateral sphenoid and maxillary sinuses. Secretions within the posterior nasopharynx. Bilateral mastoid effusions. IMPRESSION: Unremarkable non-contrast MRI appearance of the brain. No evidence of acute intracranial abnormality. Paranasal sinus mucosal thickening with bilateral maxillary and sphenoid sinus air-fluid levels. Correlate for acute sinusitis. Bilateral mastoid effusions. Electronically Signed   By: Jackey Loge DO   On: 02/07/2020 18:40   US Abdomen Complete  Result Date: 02/04/2020 CLINICAL DATA:  Acute renal insufficiency, elevated liver enzymes, hypertension EXAM: ABDOMEN ULTRASOUND COMPLETE COMPARISON:  None. FINDINGS: Gallbladder: No gallstones or wall thickening visualized. No sonographic Murphy sign noted by sonographer. Common bile duct: Diameter: 3 mm Liver: Diffuse increased liver echotexture consistent with steatosis. No focal abnormalities. Portal vein is patent on color Doppler imaging with normal direction of blood flow towards the liver. IVC: No abnormality visualized. Pancreas: No gross abnormalities.  Evaluation limited by bowel gas. Spleen: No evidence of splenomegaly.  Evaluation limited by body habitus and acoustic window. Right Kidney: Length: 13.0 cm. Echogenicity within normal limits. No mass or hydronephrosis visualized. Left Kidney: Length: 11.2 cm. Echogenicity within normal limits. No mass or hydronephrosis visualized. Abdominal aorta: No aneurysm visualized. Other findings: None. IMPRESSION: 1. Hepatic steatosis. 2. Otherwise unremarkable exam. Electronically Signed   By: Sharlet Salina M.D.   On: 02/04/2020 22:31   DG CHEST PORT 1 VIEW  Result Date: 02/10/2020 CLINICAL DATA:  Respiratory failure.  Intubation. EXAM: PORTABLE CHEST 1 VIEW COMPARISON:  02/08/2020 FINDINGS:  Endotracheal tube terminates 4.3 cm above carina. Nasogastric tube extends beyond the inferior aspect of the film. Left internal jugular line tip at low SVC. Borderline cardiomegaly. Probable small left pleural effusion. No pneumothorax. Interstitial prominence is similar. Left greater than right base airspace disease is not significantly changed. IMPRESSION: No significant change since the prior exam. Interstitial prominence, pulmonary edema versus atypical infection. Left greater than right base airspace disease, most likely atelectasis. Electronically Signed   By: Jeronimo Greaves M.D.   On: 02/10/2020 07:40   DG Chest Port 1 View  Result Date: 02/08/2020 CLINICAL DATA:  Acute respiratory failure. EXAM: PORTABLE CHEST 1 VIEW COMPARISON:  02/06/2020 FINDINGS: Endotracheal tube terminates 3.7 cm above carina. Nasogastric tube extends beyond the inferior aspect of the film. Left internal jugular line tip at mid SVC. Remote left clavicular fixation. Normal heart size. Possible small left pleural effusion. No pneumothorax. Interstitial prominence and indistinctness is similar. Right upper lobe calcified granuloma. Developing left greater than right base airspace disease. IMPRESSION: Similar interstitial thickening, pulmonary edema versus atypical infection. More focal airspace disease in the lung bases, worse on the left and new. Atelectasis versus (especially on the left) developing infection. Electronically Signed   By: Jeronimo Greaves M.D.   On: 02/08/2020 08:51   DG CHEST PORT 1 VIEW  Result Date: 02/06/2020 CLINICAL DATA:  Bedside central venous catheter placement and intubation. EXAM: PORTABLE CHEST 1 VIEW 11:55 a.m.: COMPARISON:  Earlier same day at 2:51 a.m. and previously. FINDINGS: Endotracheal tube tip in satisfactory position projecting approximately 4 cm above the carina. LEFT jugular central venous catheter tip projects over the upper SVC. No evidence of pneumothorax or mediastinal hematoma. Nasogastric  tube tip is in the posterior pharynx or upper esophagus and needs to be advanced. Diffuse patchy opacities throughout both lungs have improved since the examination earlier today. No new pulmonary parenchymal abnormalities. Calcified granuloma in the RIGHT UPPER LOBE as noted previously. IMPRESSION: 1. Endotracheal tube tip in satisfactory position projecting approximately 4 cm above the carina. 2. LEFT jugular central venous catheter tip projects over the upper SVC. No acute complicating features. 3. Nasogastric tube tip is in the posterior pharynx or upper esophagus and needs to be advanced. 4. Improving diffuse bilateral pneumonia since the examination earlier today. These results will be called to the ordering clinician or representative by the Radiologist Assistant, and communication documented in the PACS or [employer]. Electronically Signed   By: Hulan Saas M.D.   On: 02/06/2020 12:27   DG CHEST PORT 1 VIEW  Result Date: 02/06/2020 CLINICAL DATA:  Acute respiratory distress EXAM: PORTABLE CHEST 1 VIEW COMPARISON:  02/04/2020 FINDINGS: Single frontal view of the chest demonstrates progressive bilateral airspace disease. No effusion or pneumothorax. Cardiac silhouette is stable. IMPRESSION: 1. Progressive diffuse bilateral airspace disease which may reflect worsening pulmonary edema or multifocal pneumonia. Electronically Signed   By: Sharlet Salina M.D.   On: 02/06/2020 03:14   DG  Chest Portable 1 View  Result Date: 02/04/2020 CLINICAL DATA:  Hypoxia. EXAM: PORTABLE CHEST 1 VIEW COMPARISON:  None. FINDINGS: The heart size and mediastinal contours are within normal limits. No pneumothorax or pleural effusion is noted. Mild bibasilar opacities are noted concerning for possible infiltrates, atelectasis or less likely edema. The visualized skeletal structures are unremarkable. IMPRESSION: Mild bibasilar opacities are noted concerning for possible infiltrates, atelectasis or less likely edema.  Electronically Signed   By: Marijo Conception M.D.   On: 02/04/2020 18:47   Overnight EEG with video  Result Date: 02/10/2020 Lora Havens, MD     02/10/2020  2:35 PM Patient Name: Donald Carroll MRN: [id number] Epilepsy Attending: Lora Havens Referring Physician/Provider: Dr. Kathrynn Speed Duration: 02/09/2020 1524 to 02/10/2020 1139  Patient history: 54 year old male with altered mental status.  EEG evaluate for seizures.  Level of alertness: lethargic  AEDs during EEG study:  Oxcarbazepine, Versed  Technical aspects: This EEG study was done with scalp electrodes positioned according to the 10-20 International system of electrode placement. Electrical activity was acquired at a sampling rate of 500Hz  and reviewed with a high frequency filter of 70Hz  and a low frequency filter of 1Hz . EEG data were recorded continuously and digitally stored.  Description: During awake state, no clear posterior dominant rhythm was seen. EEG showed continuous generalized 3 to 5 Hz theta-delta slowing. Triphasic waves, generalized were also noted at times. Hyperventilation and photic stimulation were not performed.  Abnormality - Triphasic waves, generalized - Continuous slow, generalized  IMPRESSION: This study is suggestive of moderate diffuse encephalopathy, nonspecific to etiology but could be secondary to toxic-metabolic causes. No seizures or epileptiform discharges were seen throughout the recording. Lora Havens       Discharge Exam: Vitals:   02/13/20 0728 02/13/20 0900  BP: 109/74 119/85  Pulse: 83   Resp: 18   Temp: 98 F (36.7 C)   SpO2: 94% 96%    General: Pt is alert, awake, not in acute distress Cardiovascular: RRR, S1/S2 +, no edema Respiratory: CTA bilaterally, no wheezing, no rhonchi, no respiratory distress, no conversational dyspnea, on room air  Abdominal: Soft, NT, ND, bowel sounds + Extremities: no edema, no cyanosis Psych: Normal mood and affect, stable judgement  and insight     The results of significant diagnostics from this hospitalization (including imaging, microbiology, ancillary and laboratory) are listed below for reference.     Microbiology: Recent Results (from the past 240 hour(s))  Urine culture     Status: None   Collection Time: 02/04/20  5:59 PM   Specimen: Urine, Random  Result Value Ref Range Status   Specimen Description   Final    URINE, RANDOM Performed at [facility] [address] [place], [place] [address]    Special Requests   Final    NONE Performed at [facility], [place] [address], [place], [place] [address]    Culture   Final    NO GROWTH Performed at [facility], [place] [address], [place], [place] [address]    Report Status 02/06/2020 FINAL  Final  Respiratory Panel by RT PCR (Flu A&B, Covid) - Nasopharyngeal Swab     Status: None   Collection Time: 02/04/20  6:13 PM   Specimen: Nasopharyngeal Swab  Result Value Ref Range Status   SARS Coronavirus 2 by RT PCR NEGATIVE NEGATIVE Final    Comment: (NOTE) SARS-CoV-2 target nucleic acids are NOT DETECTED. The SARS-CoV-2  RNA is generally detectable in upper respiratoy specimens during the acute phase of infection. The lowest concentration of SARS-CoV-2 viral copies this assay can detect is 131 copies/mL. A negative result does not preclude SARS-Cov-2 infection and should not be used as the sole basis for treatment or other patient management decisions. A negative result may occur with  improper specimen collection/handling, submission of specimen other than nasopharyngeal swab, presence of viral mutation(s) within the areas targeted by this assay, and inadequate number of viral copies (<131 copies/mL). A negative result must be combined with clinical observations, patient history, and epidemiological information. The expected result is Negative. Fact Sheet for Patients:   https://www.moore.com/ Fact Sheet for Healthcare Providers:  https://www.young.biz/ This test is not yet ap proved or cleared by the [place] FDA and  has been authorized for detection and/or diagnosis of SARS-CoV-2 by FDA under an Emergency Use Authorization (EUA). This EUA will remain  in effect (meaning this test can be used) for the duration of the COVID-19 declaration under Section 564(b)(1) of the Act, 21 U.S.C. section 360bbb-3(b)(1), unless the authorization is terminated or revoked sooner.    Influenza A by PCR NEGATIVE NEGATIVE Final   Influenza B by PCR NEGATIVE NEGATIVE Final    Comment: (NOTE) The Xpert Xpress SARS-CoV-2/FLU/RSV assay is intended as an aid in  the diagnosis of influenza from Nasopharyngeal swab specimens and  should not be used as a sole basis for treatment. Nasal washings and  aspirates are unacceptable for Xpert Xpress SARS-CoV-2/FLU/RSV  testing. Fact Sheet for Patients: https://www.moore.com/ Fact Sheet for Healthcare Providers: https://www.young.biz/ This test is not yet approved or cleared by the [place] FDA and  has been authorized for detection and/or diagnosis of SARS-CoV-2 by  FDA under an Emergency Use Authorization (EUA). This EUA will remain  in effect (meaning this test can be used) for the duration of the  Covid-19 declaration under Section 564(b)(1) of the Act, 21  U.S.C. section 360bbb-3(b)(1), unless the authorization is  terminated or revoked. Performed at [facility], [address], [place], [place] [address]   Blood culture (routine x 2)     Status: None   Collection Time: 02/04/20  8:15 PM   Specimen: BLOOD  Result Value Ref Range Status   Specimen Description   Final    BLOOD LEFT ANTECUBITAL Performed at [facility], [address], [place], [place] [address]    Special Requests   Final     BOTTLES DRAWN AEROBIC AND ANAEROBIC Blood Culture adequate volume Performed at [facility], [address] [place], [place] [address]    Culture   Final    NO GROWTH 5 DAYS Performed at [facility], [address] [place], [place] [address]    Report Status 02/10/2020 FINAL  Final  Blood culture (routine x 2)     Status: None   Collection Time: 02/04/20  8:15 PM   Specimen: BLOOD  Result Value Ref Range Status   Specimen Description   Final    BLOOD RIGHT ANTECUBITAL Performed at [facility], [address], [place], [place] [address]    Special Requests   Final    BOTTLES DRAWN AEROBIC AND ANAEROBIC Blood Culture adequate volume Performed at [facility], [address], [place], [place] [address]    Culture   Final    NO GROWTH 5 DAYS Performed at [facility], [address], [place], [place]  [id number]    Report Status 02/10/2020 FINAL  Final  MRSA PCR Screening     Status: None   Collection Time: 02/05/20 10:54 AM   Specimen: Nasal Mucosa; Nasopharyngeal  Result Value Ref Range Status   MRSA by PCR NEGATIVE NEGATIVE Final    Comment:        The GeneXpert MRSA Assay (FDA approved for NASAL specimens only), is one component of a comprehensive MRSA colonization surveillance program. It is not intended to diagnose MRSA infection nor to guide or monitor treatment for MRSA infections. Performed at [facility], [address] [place], [place] [address]   Culture, respiratory (non-expectorated)     Status: None   Collection Time: 02/06/20  3:14 PM   Specimen: Tracheal Aspirate; Respiratory  Result Value Ref Range Status   Specimen Description TRACHEAL ASPIRATE  Final   Special Requests NONE  Final   Gram Stain   Final    FEW WBC PRESENT, PREDOMINANTLY PMN RARE GRAM POSITIVE COCCI RARE YEAST Performed at [facility], [address] [place], [place] [address]    Culture FEW  CANDIDA TROPICALIS  Final   Report Status 02/11/2020 FINAL  Final  CSF culture     Status: None   Collection Time: 02/07/20  4:30 PM   Specimen: CSF; Cerebrospinal Fluid  Result Value Ref Range Status   Specimen Description CSF  Final   Special Requests NONE  Final   Gram Stain NO WBC SEEN NO ORGANISMS SEEN   Final   Culture   Final    NO GROWTH 3 DAYS Performed at [facility], [address] [place], [place] [address]    Report Status 02/10/2020 FINAL  Final  Anaerobic culture     Status: None   Collection Time: 02/07/20  4:30 PM   Specimen: CSF; Cerebrospinal Fluid  Result Value Ref Range Status   Specimen Description CSF  Final   Special Requests NONE  Final   Culture   Final    NO ANAEROBES ISOLATED Performed at [facility] Lab, [address] [place], [place] [id number]    Report Status 02/12/2020 FINAL  Final     Labs: BNP (last 3 results) Recent Labs    02/04/20 2156  BNP 252.7*   Basic Metabolic Panel: Recent Labs  Lab 02/07/20 0446 02/07/20 0446 02/08/20 0436 02/08/20 0436 02/09/20 0501 02/09/20 0501 02/09/20 1526 02/10/20 0420 02/11/20 0406 02/12/20 0806 02/13/20 0255  NA 147*   < > 145   < > 147*   < > 148* 144 146* 143 143  K 3.7   < > 3.9   < > 3.1*   < > 4.3 3.9 3.3* 4.0 3.7  CL 104   < > 109   < > 115*   < > 111 107 107 109 111  CO2 31   < > 28   < > 25   < > 27 25 26 24  21*  GLUCOSE 101*   < > 114*   < > 123*   < > 118* 130* 111* 111* 93  BUN 25*   < > 20   < > 23*   < > 26* 28* 24* 24* 21*  CREATININE 1.07   < > 1.00   < > 0.83   < > 1.03 1.07 0.94 0.98 0.97  CALCIUM 8.7*   < > 8.1*   < > 7.1*   < > 8.1* 8.2* 8.4* 8.7* 8.6*  MG 2.0  --  2.0  --  1.6*  --   --  2.3 2.2  --   --   PHOS 1.9*  --  4.6  --   --   --   --  4.5  --   --   --    < > = values in this interval not displayed.   Liver Function Tests: Recent Labs  Lab 02/07/20 0446 02/09/20 0501  AST  --  31  ALT  --  37  ALKPHOS  --  75  BILITOT  --  1.0  PROT  --   4.8*  ALBUMIN 2.0* 1.7*   No results for input(s): LIPASE, AMYLASE in the last 168 hours. No results for input(s): AMMONIA in the last 168 hours. CBC: Recent Labs  Lab 02/09/20 0501 02/10/20 0420 02/11/20 0406 02/12/20 0806 02/13/20 0255  WBC 16.6* 17.0*  17.1* 16.4* 13.9* 12.7*  NEUTROABS  --  12.7*  --   --   --   HGB 10.6* 10.0*  10.0* 10.8* 11.1* 11.5*  HCT 32.5* 32.0*  31.8* 33.4* 35.3* 35.2*  MCV 97.6 102.9*  100.6* 100.0 99.7 99.4  PLT 205 178  191 195 223 252   Cardiac Enzymes: Recent Labs  Lab 02/09/20 0935  CKTOTAL 148   BNP: Invalid input(s): POCBNP CBG: Recent Labs  Lab 02/12/20 1108 02/12/20 1618 02/12/20 2031 02/13/20 0018 02/13/20 0727  GLUCAP 117* 89 115* 82 86   D-Dimer No results for input(s): DDIMER in the last 72 hours. Hgb A1c No results for input(s): HGBA1C in the last 72 hours. Lipid Profile No results for input(s): CHOL, HDL, LDLCALC, TRIG, CHOLHDL, LDLDIRECT in the last 72 hours. Thyroid function studies No results for input(s): TSH, T4TOTAL, T3FREE, THYROIDAB in the last 72 hours.  Invalid input(s): FREET3 Anemia work up No results for input(s): VITAMINB12, FOLATE, FERRITIN, TIBC, IRON, RETICCTPCT in the last 72 hours. Urinalysis    Component Value Date/Time   COLORURINE AMBER (A) 02/04/2020 1759   APPEARANCEUR HAZY (A) 02/04/2020 1759   LABSPEC 1.017 02/04/2020 1759   PHURINE 5.0 02/04/2020 1759   GLUCOSEU NEGATIVE 02/04/2020 1759   HGBUR SMALL (A) 02/04/2020 1759   BILIRUBINUR NEGATIVE 02/04/2020 1759   KETONESUR NEGATIVE 02/04/2020 1759   PROTEINUR 30 (A) 02/04/2020 1759   NITRITE NEGATIVE 02/04/2020 1759   LEUKOCYTESUR NEGATIVE 02/04/2020 1759   Sepsis Labs Invalid input(s): PROCALCITONIN,  WBC,  LACTICIDVEN Microbiology Recent Results (from the past 240 hour(s))  Urine culture     Status: None   Collection Time: 02/04/20  5:59 PM   Specimen: Urine, Random  Result Value Ref Range Status   Specimen Description    Final    URINE, RANDOM Performed at [facility], [address] [place], [place] [address]    Special Requests   Final    NONE Performed at [facility], [address] [place], [place] [address]    Culture   Final    NO GROWTH Performed at [facility], [address], [place], [place] [address]    Report Status 02/06/2020 FINAL  Final  Respiratory Panel by RT PCR (Flu A&B, Covid) - Nasopharyngeal Swab     Status: None   Collection Time: 02/04/20  6:13 PM   Specimen: Nasopharyngeal Swab  Result Value Ref Range Status   SARS Coronavirus 2 by RT PCR NEGATIVE NEGATIVE Final    Comment: (NOTE) SARS-CoV-2 target nucleic acids are NOT DETECTED.  The SARS-CoV-2 RNA is generally detectable in upper respiratoy specimens during the acute phase of infection. The lowest concentration of SARS-CoV-2 viral copies this assay can detect is 131 copies/mL. A negative result does not preclude SARS-Cov-2 infection and should not be used as the sole basis for treatment or other patient management decisions. A negative result may occur with  improper specimen collection/handling, submission of specimen other than nasopharyngeal swab, presence of viral mutation(s) within the areas targeted by this assay, and inadequate number of viral copies (<131 copies/mL). A negative result must be combined with clinical observations, patient history, and epidemiological information. The expected result is Negative. Fact Sheet for Patients:  https://www.moore.com/ Fact Sheet for Healthcare Providers:  https://www.young.biz/ This test is not yet ap proved or cleared by the [place] FDA and  has been authorized for detection and/or diagnosis of SARS-CoV-2 by FDA under an Emergency Use Authorization (EUA). This EUA will remain  in effect (meaning this test can be used) for the duration of the COVID-19 declaration under Section  564(b)(1) of the Act, 21 U.S.C. section 360bbb-3(b)(1), unless the authorization is terminated or revoked sooner.    Influenza A by PCR NEGATIVE NEGATIVE Final   Influenza B by PCR NEGATIVE NEGATIVE Final    Comment: (NOTE) The Xpert Xpress SARS-CoV-2/FLU/RSV assay is intended as an aid in  the diagnosis of influenza from Nasopharyngeal swab specimens and  should not be used as a sole basis for treatment. Nasal washings and  aspirates are unacceptable for Xpert Xpress SARS-CoV-2/FLU/RSV  testing. Fact Sheet for Patients: https://www.moore.com/ Fact Sheet for Healthcare Providers: https://www.young.biz/ This test is not yet approved or cleared by the [place] FDA and  has been authorized for detection and/or diagnosis of SARS-CoV-2 by  FDA under an Emergency Use Authorization (EUA). This EUA will remain  in effect (meaning this test can be used) for the duration of the  Covid-19 declaration under Section 564(b)(1) of the Act, 21  U.S.C. section 360bbb-3(b)(1), unless the authorization is  terminated or revoked. Performed at [facility], [address] [place], [place] [address]   Blood culture (routine x 2)     Status: None   Collection Time: 02/04/20  8:15 PM   Specimen: BLOOD  Result Value Ref Range Status   Specimen Description   Final    BLOOD LEFT ANTECUBITAL Performed at [facility], [address], [place], [place] [address]    Special Requests   Final    BOTTLES DRAWN AEROBIC AND ANAEROBIC Blood Culture adequate volume Performed at [facility], [address], [place], [place] [address]    Culture   Final    NO GROWTH 5 DAYS Performed at [facility], [address], [place], [place] [address]    Report Status 02/10/2020 FINAL  Final  Blood culture (routine x 2)     Status: None   Collection Time: 02/04/20  8:15 PM   Specimen: BLOOD  Result Value Ref Range  Status   Specimen Description   Final    BLOOD RIGHT ANTECUBITAL Performed at [facility], [address], [place], [place] [address]    Special Requests   Final    BOTTLES DRAWN AEROBIC AND ANAEROBIC Blood Culture adequate volume Performed at [facility], [address], [place], [place] [address]    Culture   Final    NO GROWTH 5 DAYS Performed at [facility], [address],  [place], [place] [address]    Report Status 02/10/2020 FINAL  Final  MRSA PCR Screening     Status: None   Collection Time: 02/05/20 10:54 AM   Specimen: Nasal Mucosa; Nasopharyngeal  Result Value Ref Range Status   MRSA by PCR NEGATIVE NEGATIVE Final    Comment:        The GeneXpert MRSA Assay (FDA approved for NASAL specimens only), is one component of a comprehensive MRSA colonization surveillance program. It is not intended to diagnose MRSA infection nor to guide or monitor treatment for MRSA infections. Performed at [facility], [address], [place], [place] [address]   Culture, respiratory (non-expectorated)     Status: None   Collection Time: 02/06/20  3:14 PM   Specimen: Tracheal Aspirate; Respiratory  Result Value Ref Range Status   Specimen Description TRACHEAL ASPIRATE  Final   Special Requests NONE  Final   Gram Stain   Final    FEW WBC PRESENT, PREDOMINANTLY PMN RARE GRAM POSITIVE COCCI RARE YEAST Performed at [facility], [address] [place], [place] [address]    Culture FEW CANDIDA TROPICALIS  Final   Report Status 02/11/2020 FINAL  Final  CSF culture     Status: None   Collection Time: 02/07/20  4:30 PM   Specimen: CSF; Cerebrospinal Fluid  Result Value Ref Range Status   Specimen Description CSF  Final   Special Requests NONE  Final   Gram Stain NO WBC SEEN NO ORGANISMS SEEN   Final   Culture   Final    NO GROWTH 3 DAYS Performed at [facility], [address] [place], [place] [address]     Report Status 02/10/2020 FINAL  Final  Anaerobic culture     Status: None   Collection Time: 02/07/20  4:30 PM   Specimen: CSF; Cerebrospinal Fluid  Result Value Ref Range Status   Specimen Description CSF  Final   Special Requests NONE  Final   Culture   Final    NO ANAEROBES ISOLATED Performed at [facility], [address] [place], [place] [address]    Report Status 02/12/2020 FINAL  Final     Patient was seen and examined on the day of discharge and was found to be in stable condition. Time coordinating discharge: 25 minutes including assessment and coordination of care, as well as examination of the patient.   SIGNED:  Noralee Stain, DO [facility] 02/13/2020, 9:27 AM

## 2020-02-13 NOTE — Progress Notes (Signed)
Pt is dressed and belongings with pt.  Peripheral iv removed and dressing applied.  pt's girlfriend is at bedside.

## 2020-02-13 NOTE — TOC Transition Note (Addendum)
Transition of Care [facility]) - CM/SW Discharge Note   Patient Details  Name: Donald Carroll MRN: [id number] Date of Birth: 06/20/1966  Transition [facility]) CM/SW Contact:  Leone Haven, RN Phone Number: 02/13/2020, 11:27 AM   Clinical Narrative:    Patient is for dc today, he will have HHPT with [facility] for charity and rolling walker with [employer] for charity. The walker will be brought up to room prior to dc.   1200- NCM received call from [facility] with [facility] , stating they can not do charity for patient due to PSA.  NCM spoke with girlfriend in the room, and informed her of this information, she states she will do exercise with patient, he will be going home with her.     Final next level of care: Home w Home Health Services Barriers to Discharge: No Barriers Identified   Patient Goals and CMS Choice Patient states their goals for this hospitalization and ongoing recovery are:: get better CMS Medicare.gov Compare Post Acute Care list provided to:: Patient Represenative (must comment)    Discharge Placement                       Discharge Plan and Services                DME Arranged: Walker rolling DME Agency: [facility] Date DME Agency Contacted: 02/13/20 Time DME Agency Contacted: 1126 Representative spoke with at DME Agency: Tamela Oddi HH Arranged: PT HH Agency: [facility] Date HH Agency Contacted: 02/13/20 Time HH Agency Contacted: 1127 Representative spoke with at [facility] Agency: Lupita Leash  Social Determinants of Health (SDOH) Interventions     Readmission Risk Interventions No flowsheet data found.

## 2020-02-13 NOTE — Progress Notes (Signed)
Physical Therapy Treatment Patient Details Name: Donald Carroll MRN: [id number] DOB: 1966/03/27 Today's Date: 02/13/2020    History of Present Illness 54 year old male with history of ETOH/substance abuse, HTN, PTSD, suicidal ideation in 10/20, came in with altered mental status and increased twitching. Per chart was found to be significantly hypotensive and with acute kidney injury. MRI unremarkable non-contrast MRI appearance of the brain. No evidence of acute intracranial abnormality. Intubated 4/14-4/19.     PT Comments    Pt in bed upon arrival of PT, eager to ambulate and get out of bed. The pt was able to perform bed mobility without any assist other than line management, and sit-stand transfers with supervision and RW. The pt was then able to perform short ambulation in the hall with use of RW for safety. SpO2 was noted at 98% on RA at rest, maintained at 96-98% with initial ambulation, decreased to low 90s with fatigue. Pt recovered to 98% within 5 seconds of standing rest. Pt will continue to benefit from skilled PT to progress functional mobility and endurance.     Follow Up Recommendations  Home health PT;Supervision/Assistance - 24 hour     Equipment Recommendations  None recommended by PT    Recommendations for Other Services       Precautions / Restrictions Precautions Precautions: Fall Restrictions Weight Bearing Restrictions: No    Mobility  Bed Mobility Overal bed mobility: Needs Assistance Bed Mobility: Supine to Sit     Supine to sit: Supervision     General bed mobility comments: pt able to move to EOB without assist, needed assist for bed rail management  Transfers Overall transfer level: Needs assistance Equipment used: Rolling walker (2 wheeled) Transfers: Sit to/from Stand Sit to Stand: Min assist         General transfer comment: cues for hand placement to stabilze.  Stability assist.  Ambulation/Gait Ambulation/Gait assistance: Min  guard Gait Distance (Feet): 60 Feet Assistive device: Rolling walker (2 wheeled) Gait Pattern/deviations: Step-to pattern;Step-through pattern;Decreased stride length   Gait velocity interpretation: <1.31 ft/sec, indicative of household ambulator General Gait Details: slowed gait with good stability and RW use. SpO2 declined to 90 with fatigue, increased quickly to 95% with 1L   Stairs             Wheelchair Mobility    Modified Rankin (Stroke Patients Only)       Balance Overall balance assessment: Needs assistance Sitting-balance support: Single extremity supported;No upper extremity supported Sitting balance-Leahy Scale: Good     Standing balance support: Bilateral upper extremity supported Standing balance-Leahy Scale: Fair Standing balance comment: able to static stand, relies on RW for ambulation                            Cognition Arousal/Alertness: Awake/alert Behavior During Therapy: WFL for tasks assessed/performed Overall Cognitive Status: Impaired/Different from baseline Area of Impairment: Safety/judgement;Problem solving                         Safety/Judgement: Decreased awareness of deficits   Problem Solving: Slow processing;Decreased initiation;Requires verbal cues General Comments: Pt with improved cognition, generally functional good command following, able to verbalize when fatigued      Exercises      General Comments        Pertinent Vitals/Pain Pain Assessment: No/denies pain Pain Intervention(s): Limited activity within patient's tolerance;Monitored during session    Home Living  Prior Function            PT Goals (current goals can now be found in the care plan section) Acute Rehab PT Goals PT Goal Formulation: Patient unable to participate in goal setting Time For Goal Achievement: 02/25/20 Potential to Achieve Goals: Good Progress towards PT goals: Progressing toward  goals    Frequency    Min 3X/week      PT Plan Current plan remains appropriate    Co-evaluation              AM-PAC PT "6 Clicks" Mobility   Outcome Measure  Help needed turning from your back to your side while in a flat bed without using bedrails?: None Help needed moving from lying on your back to sitting on the side of a flat bed without using bedrails?: None Help needed moving to and from a bed to a chair (including a wheelchair)?: A Little Help needed standing up from a chair using your arms (e.g., wheelchair or bedside chair)?: A Little Help needed to walk in hospital room?: A Little Help needed climbing 3-5 steps with a railing? : A Little 6 Click Score: 20    End of Session Equipment Utilized During Treatment: Gait belt Activity Tolerance: Patient tolerated treatment well;Patient limited by fatigue Patient left: in chair;with call bell/phone within reach;with chair alarm set Nurse Communication: Mobility status PT Visit Diagnosis: Unsteadiness on feet (R26.81);Difficulty in walking, not elsewhere classified (R26.2)     Time: 0174-9449 PT Time Calculation (min) (ACUTE ONLY): 23 min  Charges:  $Gait Training: 23-37 mins                     Karma Ganja, PT, DPT   Acute Rehabilitation Department Pager #: [phone]   Otho Bellows 02/13/2020, 10:32 AM

## 2020-03-06 ENCOUNTER — Emergency Department (HOSPITAL_COMMUNITY): Payer: Self-pay

## 2020-03-06 ENCOUNTER — Observation Stay (HOSPITAL_COMMUNITY)
Admission: EM | Admit: 2020-03-06 | Discharge: 2020-03-08 | Disposition: A | Discharge status: Home / Self Care | Payer: Self-pay | Attending: Internal Medicine | Admitting: Internal Medicine

## 2020-03-06 ENCOUNTER — Other Ambulatory Visit: Payer: Self-pay

## 2020-03-06 DIAGNOSIS — F1511 Other stimulant abuse, in remission: Secondary | ICD-10-CM | POA: Insufficient documentation

## 2020-03-06 DIAGNOSIS — S0003XA Contusion of scalp, initial encounter: Secondary | ICD-10-CM | POA: Insufficient documentation

## 2020-03-06 DIAGNOSIS — Y9301 Activity, walking, marching and hiking: Secondary | ICD-10-CM | POA: Insufficient documentation

## 2020-03-06 DIAGNOSIS — Z8249 Family history of ischemic heart disease and other diseases of the circulatory system: Secondary | ICD-10-CM | POA: Insufficient documentation

## 2020-03-06 DIAGNOSIS — R55 Syncope and collapse: Principal | ICD-10-CM | POA: Insufficient documentation

## 2020-03-06 DIAGNOSIS — F17210 Nicotine dependence, cigarettes, uncomplicated: Secondary | ICD-10-CM | POA: Insufficient documentation

## 2020-03-06 DIAGNOSIS — G6281 Critical illness polyneuropathy: Secondary | ICD-10-CM | POA: Diagnosis present

## 2020-03-06 DIAGNOSIS — F319 Bipolar disorder, unspecified: Secondary | ICD-10-CM | POA: Insufficient documentation

## 2020-03-06 DIAGNOSIS — I10 Essential (primary) hypertension: Secondary | ICD-10-CM | POA: Insufficient documentation

## 2020-03-06 DIAGNOSIS — W19XXXA Unspecified fall, initial encounter: Secondary | ICD-10-CM | POA: Insufficient documentation

## 2020-03-06 DIAGNOSIS — Z79899 Other long term (current) drug therapy: Secondary | ICD-10-CM | POA: Insufficient documentation

## 2020-03-06 DIAGNOSIS — F1111 Opioid abuse, in remission: Secondary | ICD-10-CM | POA: Diagnosis present

## 2020-03-06 DIAGNOSIS — Z20822 Contact with and (suspected) exposure to covid-19: Secondary | ICD-10-CM | POA: Insufficient documentation

## 2020-03-06 DIAGNOSIS — F1011 Alcohol abuse, in remission: Secondary | ICD-10-CM | POA: Insufficient documentation

## 2020-03-06 DIAGNOSIS — R911 Solitary pulmonary nodule: Secondary | ICD-10-CM | POA: Diagnosis present

## 2020-03-06 DIAGNOSIS — F4310 Post-traumatic stress disorder, unspecified: Secondary | ICD-10-CM | POA: Insufficient documentation

## 2020-03-06 LAB — CBC WITH DIFFERENTIAL/PLATELET
Abs Immature Granulocytes: 0.06 10*3/uL (ref 0.00–0.07)
Basophils Absolute: 0.1 10*3/uL (ref 0.0–0.1)
Basophils Relative: 1 %
Eosinophils Absolute: 0.1 10*3/uL (ref 0.0–0.5)
Eosinophils Relative: 1 %
HCT: 45.7 % (ref 39.0–52.0)
Hemoglobin: 15.2 g/dL (ref 13.0–17.0)
Immature Granulocytes: 1 %
Lymphocytes Relative: 10 %
Lymphs Abs: 1.3 10*3/uL (ref 0.7–4.0)
MCH: 31.9 pg (ref 26.0–34.0)
MCHC: 33.3 g/dL (ref 30.0–36.0)
MCV: 95.8 fL (ref 80.0–100.0)
Monocytes Absolute: 1 10*3/uL (ref 0.1–1.0)
Monocytes Relative: 7 %
Neutro Abs: 10.6 10*3/uL — ABNORMAL HIGH (ref 1.7–7.7)
Neutrophils Relative %: 80 %
Platelets: 244 10*3/uL (ref 150–400)
RBC: 4.77 MIL/uL (ref 4.22–5.81)
RDW: 13.6 % (ref 11.5–15.5)
WBC: 13 10*3/uL — ABNORMAL HIGH (ref 4.0–10.5)
nRBC: 0 % (ref 0.0–0.2)

## 2020-03-06 LAB — BASIC METABOLIC PANEL
Anion gap: 11 (ref 5–15)
BUN: 14 mg/dL (ref 6–20)
CO2: 25 mmol/L (ref 22–32)
Calcium: 9.9 mg/dL (ref 8.9–10.3)
Chloride: 101 mmol/L (ref 98–111)
Creatinine, Ser: 1.04 mg/dL (ref 0.61–1.24)
GFR calc Af Amer: >60 mL/min (ref >60)
GFR calc non Af Amer: >60 mL/min (ref >60)
Glucose, Bld: 87 mg/dL (ref 70–99)
Potassium: 4.2 mmol/L (ref 3.5–5.1)
Sodium: 137 mmol/L (ref 135–145)

## 2020-03-06 LAB — I-STAT CHEM 8, ED
BUN: 14 mg/dL (ref 6–20)
Calcium, Ion: 1.2 mmol/L (ref 1.15–1.40)
Chloride: 102 mmol/L (ref 98–111)
Creatinine, Ser: 0.9 mg/dL (ref 0.61–1.24)
Glucose, Bld: 82 mg/dL (ref 70–99)
HCT: 46 % (ref 39.0–52.0)
Hemoglobin: 15.6 g/dL (ref 13.0–17.0)
Potassium: 4.2 mmol/L (ref 3.5–5.1)
Sodium: 139 mmol/L (ref 135–145)
TCO2: 26 mmol/L (ref 22–32)

## 2020-03-06 LAB — CBG MONITORING, ED: Glucose-Capillary: 88 mg/dL (ref 70–99)

## 2020-03-06 LAB — HEPATIC FUNCTION PANEL
ALT: 34 U/L (ref 0–44)
AST: 27 U/L (ref 15–41)
Albumin: 4 g/dL (ref 3.5–5.0)
Alkaline Phosphatase: 84 U/L (ref 38–126)
Bilirubin, Direct: 0.2 mg/dL (ref 0.0–0.2)
Indirect Bilirubin: 0.5 mg/dL (ref 0.3–0.9)
Total Bilirubin: 0.7 mg/dL (ref 0.3–1.2)
Total Protein: 7.6 g/dL (ref 6.5–8.1)

## 2020-03-06 LAB — PROTIME-INR
INR: 1 (ref 0.8–1.2)
Prothrombin Time: 12.9 seconds (ref 11.4–15.2)

## 2020-03-06 LAB — BRAIN NATRIURETIC PEPTIDE: B Natriuretic Peptide: 58.8 pg/mL (ref 0.0–100.0)

## 2020-03-06 LAB — TROPONIN I (HIGH SENSITIVITY)
Troponin I (High Sensitivity): 4 ng/L (ref <18)
Troponin I (High Sensitivity): 5 ng/L (ref <18)

## 2020-03-06 LAB — SARS CORONAVIRUS 2 BY RT PCR (HOSPITAL ORDER, PERFORMED IN ~~LOC~~ HOSPITAL LAB): SARS Coronavirus 2: NEGATIVE

## 2020-03-06 LAB — MAGNESIUM: Magnesium: 2.1 mg/dL (ref 1.7–2.4)

## 2020-03-06 MED ORDER — ONDANSETRON HCL 4 MG PO TABS: 4.0000 mg | ORAL_TABLET | Freq: Four times a day (QID) | ORAL | Status: DC | PRN

## 2020-03-06 MED ORDER — ACETAMINOPHEN 650 MG RE SUPP: 650.0000 mg | Freq: Four times a day (QID) | RECTAL | Status: DC | PRN

## 2020-03-06 MED ORDER — IOHEXOL 350 MG/ML SOLN
80.0000 mL | Freq: Once | INTRAVENOUS | Status: AC | PRN
  Administered 2020-03-06: 80 mL via INTRAVENOUS

## 2020-03-06 MED ORDER — GABAPENTIN 400 MG PO CAPS
400.0000 mg | ORAL_CAPSULE | Freq: Three times a day (TID) | ORAL | Status: DC
  Administered 2020-03-06 – 2020-03-08 (×5): 400 mg via ORAL
  Filled 2020-03-06 (×5): qty 1

## 2020-03-06 MED ORDER — FOLIC ACID 1 MG PO TABS
1.0000 mg | ORAL_TABLET | Freq: Every day | ORAL | Status: DC
  Administered 2020-03-07 – 2020-03-08 (×2): 1 mg via ORAL
  Filled 2020-03-06 (×2): qty 1

## 2020-03-06 MED ORDER — DULOXETINE HCL 60 MG PO CPEP
60.0000 mg | ORAL_CAPSULE | Freq: Every day | ORAL | Status: DC
  Administered 2020-03-07 – 2020-03-08 (×2): 60 mg via ORAL
  Filled 2020-03-06 (×3): qty 1

## 2020-03-06 MED ORDER — LORAZEPAM 2 MG/ML IJ SOLN: 1.0000 mg | INTRAMUSCULAR | Status: DC | PRN

## 2020-03-06 MED ORDER — ADULT MULTIVITAMIN W/MINERALS CH
1.0000 | ORAL_TABLET | Freq: Every day | ORAL | Status: DC
  Administered 2020-03-07 – 2020-03-08 (×2): 1 via ORAL
  Filled 2020-03-06 (×2): qty 1

## 2020-03-06 MED ORDER — THIAMINE HCL 100 MG PO TABS
100.0000 mg | ORAL_TABLET | Freq: Every day | ORAL | Status: DC
  Administered 2020-03-07 – 2020-03-08 (×2): 100 mg via ORAL
  Filled 2020-03-06 (×2): qty 1

## 2020-03-06 MED ORDER — SODIUM CHLORIDE 0.9% FLUSH
3.0000 mL | Freq: Two times a day (BID) | INTRAVENOUS | Status: DC
  Administered 2020-03-06 – 2020-03-07 (×2): 3 mL via INTRAVENOUS

## 2020-03-06 MED ORDER — LORAZEPAM 1 MG PO TABS: 1.0000 mg | ORAL_TABLET | ORAL | Status: DC | PRN

## 2020-03-06 MED ORDER — QUETIAPINE FUMARATE 100 MG PO TABS
100.0000 mg | ORAL_TABLET | Freq: Every day | ORAL | Status: DC
  Administered 2020-03-06 – 2020-03-07 (×2): 100 mg via ORAL
  Filled 2020-03-06 (×3): qty 1

## 2020-03-06 MED ORDER — ONDANSETRON HCL 4 MG/2ML IJ SOLN: 4.0000 mg | Freq: Four times a day (QID) | INTRAMUSCULAR | Status: DC | PRN

## 2020-03-06 MED ORDER — THIAMINE HCL 100 MG/ML IJ SOLN: 100.0000 mg | Freq: Every day | INTRAMUSCULAR | Status: DC

## 2020-03-06 MED ORDER — ACETAMINOPHEN 325 MG PO TABS
650.0000 mg | ORAL_TABLET | Freq: Four times a day (QID) | ORAL | Status: DC | PRN
  Administered 2020-03-07: 650 mg via ORAL
  Filled 2020-03-06: qty 2

## 2020-03-06 MED ORDER — ACETAMINOPHEN 325 MG PO TABS
650.0000 mg | ORAL_TABLET | Freq: Once | ORAL | Status: AC
  Administered 2020-03-06: 650 mg via ORAL
  Filled 2020-03-06: qty 2

## 2020-03-06 MED ORDER — ONDANSETRON HCL 4 MG/2ML IJ SOLN: 4.0000 mg | Freq: Three times a day (TID) | INTRAMUSCULAR | Status: DC | PRN

## 2020-03-06 NOTE — ED Notes (Signed)
DC'd last admission with diagnosis of severe sepsis, metabolic encephalopathy, ETOH and mood disorder, AKI, and other related diagnosis.  SO en route to give further hx.

## 2020-03-06 NOTE — ED Notes (Signed)
Pt does endorse feeling dizziness and the room spinning prior to the fall.

## 2020-03-06 NOTE — ED Provider Notes (Signed)
[facility] EMERGENCY DEPARTMENT Provider Note   CSN: [id number] Arrival date & time: 03/06/20  1524     History No chief complaint on file.   Donald Carroll is a 54 y.o. male.  HPI Patient is a 54 year old male who presents after a syncope and collapse.  Episode occurred shortly prior to arrival.  Per his significant other, he had not complained of anything earlier in the day.  They had dropped off their son and were at the [facility].  Patient was ambulating with a walker.  He has had weakness since his last hospitalization, 2 weeks ago, during which he was intubated for a week.  Has a residual store, patient went unconscious on his feet and fell directly backwards.  An attempt was made to hold him by the front of his shirt, but he fell, striking the back of his head.  Estimated LOC was 1.5 minutes.  Following regaining of consciousness, patient was confused and did not recognize his significant other.  Upon arrival in the ED, he is awake, alert, and oriented.  He complains of left parieto-occipital pain.  He does not endorse any other acute pain.    Past Medical History:  Diagnosis Date  . Anxiety   . Depression   . Hypertension     Patient Active Problem List   Diagnosis Date Noted  . Pulmonary nodule 03/06/2020  . Syncope 03/06/2020  . History of ETOH abuse 03/06/2020  . History of heroin abuse (HCC) 03/06/2020  . Critical illness neuropathy (HCC) 03/06/2020  . Severe sepsis with acute organ dysfunction (HCC) 02/04/2020  . Acute respiratory failure with hypoxia (HCC) 02/04/2020  . Acute kidney failure (HCC) 02/04/2020  . Sepsis associated hypotension (HCC) 02/04/2020  . Acute metabolic encephalopathy 02/04/2020  . MDD (major depressive disorder) 08/03/2019  . PTSD (post-traumatic stress disorder) 07/18/2018  . Alcohol dependence with alcohol-induced mood disorder (HCC) 07/18/2018  . Bipolar I disorder, current or most recent episode depressed, with  psychotic features (HCC) 07/17/2018    No past surgical history on file.     Family History  Problem Relation Age of Onset  . Hypertension Mother   . Hypertension Father     Social History   Tobacco Use  . Smoking status: Current Every Day Smoker    Packs/day: 1.00    Types: Cigarettes  . Smokeless tobacco: Never Used  Substance Use Topics  . Alcohol use: Yes    Comment: drinking daily past month  . Drug use: Yes    Types: Heroin    Home Medications Prior to Admission medications   Medication Sig Start Date End Date Taking? Authorizing Provider  DULoxetine (CYMBALTA) 60 MG capsule Take 60 mg by mouth daily.   Yes [provider]  gabapentin (NEURONTIN) 400 MG capsule Take 400 mg by mouth 3 (three) times daily.   Yes [provider]  QUEtiapine (SEROQUEL) 100 MG tablet Take 1 tablet (100 mg total) by mouth at bedtime. 02/13/20  Yes Noralee Stain, DO  cloNIDine (CATAPRES) 0.1 MG tablet Take 1 tablet (0.1 mg total) by mouth 2 (two) times daily for 2 doses. Take one evening of 4/22, take one morning of 4/23 02/13/20 02/14/20  Noralee Stain, DO    Allergies    Patient has no known allergies.  Review of Systems   Review of Systems  Constitutional: Negative for activity change, appetite change, chills and fever.  HENT: Negative.  Negative for ear pain and sore throat.  Eyes: Negative.  Negative for photophobia, pain and visual disturbance.  Respiratory: Negative.  Negative for cough, chest tightness, shortness of breath and wheezing.   Cardiovascular: Negative.  Negative for chest pain, palpitations and leg swelling.  Gastrointestinal: Negative.  Negative for abdominal distention, abdominal pain, diarrhea, nausea and vomiting.  Genitourinary: Negative.  Negative for dysuria and hematuria.  Musculoskeletal: Positive for gait problem (x3 weeks). Negative for arthralgias, back pain, joint swelling, myalgias, neck pain and neck stiffness.  Skin: Negative.   Negative for color change, pallor, rash and wound.  Neurological: Positive for syncope and weakness (generalized, chronic). Negative for dizziness, seizures, facial asymmetry, light-headedness, numbness and headaches.  Hematological: Negative.  Does not bruise/bleed easily.  Psychiatric/Behavioral: Positive for confusion (resolved) and decreased concentration.  All other systems reviewed and are negative.   Physical Exam Updated Vital Signs BP 121/80   Pulse 68   Temp 98 F (36.7 C) (Oral)   Resp 20   Ht  (1.854 m)   Wt 94.1 kg   SpO2 99%   BMI 27.37 kg/m   Physical Exam Vitals and nursing note reviewed. Exam conducted with a chaperone present.  Constitutional:      General: He is not in acute distress.    Appearance: He is well-developed and normal weight. He is not toxic-appearing or diaphoretic.  HENT:     Head: Normocephalic and atraumatic.     Right Ear: External ear normal.     Left Ear: External ear normal.     Nose: Nose normal.     Mouth/Throat:     Mouth: Mucous membranes are moist.     Pharynx: Oropharynx is clear.  Eyes:     General: No visual field deficit or scleral icterus.    Extraocular Movements: Extraocular movements intact.     Conjunctiva/sclera: Conjunctivae normal.     Pupils: Pupils are equal, round, and reactive to light.  Cardiovascular:     Rate and Rhythm: Normal rate and regular rhythm.     Heart sounds: Normal heart sounds. No murmur.  Pulmonary:     Effort: Pulmonary effort is normal. No respiratory distress.     Breath sounds: Normal breath sounds. No wheezing or rales.  Chest:     Chest wall: No tenderness.  Abdominal:     General: There is no distension.     Palpations: Abdomen is soft.     Tenderness: There is no abdominal tenderness. There is no right CVA tenderness, left CVA tenderness or guarding.  Musculoskeletal:        General: No swelling, tenderness, deformity or signs of injury. Normal range of motion.     Cervical  back: Normal range of motion and neck supple. No rigidity or tenderness.     Right lower leg: No edema.     Left lower leg: No edema.  Skin:    General: Skin is warm and dry.     Coloration: Skin is not jaundiced or pale.     Findings: No bruising.  Neurological:     General: No focal deficit present.     Mental Status: He is alert and oriented to person, place, and time.     Cranial Nerves: Cranial nerves are intact. No cranial nerve deficit, dysarthria or facial asymmetry.     Sensory: Sensation is intact. No sensory deficit.     Motor: Weakness (4/5 in all extremeties) present. No tremor or abnormal muscle tone.     Coordination: Coordination is intact. Coordination normal.  Finger-Nose-Finger Test normal.     Comments: Appears sleepy Gait exam deferred  Psychiatric:        Speech: Speech normal.        Behavior: Behavior is slowed. Behavior is cooperative.     ED Results / Procedures / Treatments   Labs (all labs ordered are listed, but only abnormal results are displayed) Labs Reviewed  CBC WITH DIFFERENTIAL/PLATELET - Abnormal; Notable for the following components:      Result Value   WBC 13.0 (*)    Neutro Abs 10.6 (*)    All other components within normal limits  SARS CORONAVIRUS 2 BY RT PCR (HOSPITAL ORDER, PERFORMED IN [facility])  BASIC METABOLIC PANEL  MAGNESIUM  HEPATIC FUNCTION PANEL  PROTIME-INR  BRAIN NATRIURETIC PEPTIDE  RAPID URINE DRUG SCREEN, HOSP PERFORMED  ETHANOL  CBC  BASIC METABOLIC PANEL  CBG MONITORING, ED  CBG MONITORING, ED  I-STAT CHEM 8, ED  TROPONIN I (HIGH SENSITIVITY)  TROPONIN I (HIGH SENSITIVITY)    EKG EKG Interpretation  Date/Time:  Friday Mar 06 2020 15:33:58 EDT Ventricular Rate:  93 PR Interval:    QRS Duration: 88 QT Interval:  356 QTC Calculation: 443 R Axis:   -54 Text Interpretation: Sinus rhythm Left atrial enlargement Inferior infarct, old Confirmed by Tilden Fossa [phone]) on 03/06/2020 3:37:38  PM   Radiology CT Head Wo Contrast  Result Date: 03/06/2020 CLINICAL DATA:  Syncopal episode. Fall. Trauma to back of head. EXAM: CT HEAD WITHOUT CONTRAST CT CERVICAL SPINE WITHOUT CONTRAST TECHNIQUE: Multidetector CT imaging of the head and cervical spine was performed following the standard protocol without intravenous contrast. Multiplanar CT image reconstructions of the cervical spine were also generated. COMPARISON:  MR head without contrast 02/07/2020. FINDINGS: CT HEAD FINDINGS Brain: No acute infarct, hemorrhage, or mass lesion is present. No significant white matter lesions are present. The ventricles are of normal size. No significant extraaxial fluid collection is present. The brainstem and cerebellum are within normal limits. Vascular: No hyperdense vessel or unexpected calcification. Skull: Calvarium is intact. No focal lytic or blastic lesions are present. Left parietal occipital scalp hematoma is present without underlying fracture. No foreign body is present. Sinuses/Orbits: The paranasal sinuses and mastoid air cells are clear. The globes and orbits are within normal limits. CT CERVICAL SPINE FINDINGS Alignment: No significant listhesis is present. There is some straightening of the normal cervical lordosis. Skull base and vertebrae: Craniocervical junction is normal. Vertebral body heights are maintained. Endplate degenerative changes are present at C4-5 left greater than right. Vertebral body heights are maintained. No acute or healing fractures are present. Soft tissues and spinal canal: No prevertebral fluid or swelling. No visible canal hematoma. Disc levels: Uncovertebral spurring leads to moderate foraminal stenosis bilaterally at C4-5 right greater than left. Degenerative changes are present at C3-4 and C5-6 without significant stenosis at these levels. Upper chest: The lung apices are clear. Thoracic inlet is within normal. IMPRESSION: 1. Left parietal occipital scalp hematoma without  underlying fracture. 2. Normal CT appearance of the brain. 3. Multilevel degenerative changes of the cervical spine as described. Degenerative changes are greatest at C4-5. 4. No acute or healing fractures of the cervical spine. Electronically Signed   By: Marin Roberts M.D.   On: 03/06/2020 17:35   CT Angio Chest PE W and/or Wo Contrast  Result Date: 03/06/2020 CLINICAL DATA:  Shortness of breath and syncopal episode EXAM: CT ANGIOGRAPHY CHEST WITH CONTRAST TECHNIQUE: Multidetector CT imaging of the chest  was performed using the standard protocol during bolus administration of intravenous contrast. Multiplanar CT image reconstructions and MIPs were obtained to evaluate the vascular anatomy. CONTRAST:  63mL OMNIPAQUE IOHEXOL 350 MG/ML SOLN COMPARISON:  None. FINDINGS: Cardiovascular: There is a optimal opacification of the pulmonary arteries. There is no central,segmental, or subsegmental filling defects within the pulmonary arteries. The heart is normal in size. No pericardial effusion or thickening. No evidence right heart strain. There is normal three-vessel brachiocephalic anatomy without proximal stenosis. The thoracic aorta is normal in appearance. Mediastinum/Nodes: No hilar, mediastinal, or axillary adenopathy. There are however partially calcified lymph nodes within the anterior pretracheal and right hilar space. Thyroid gland, trachea, and esophagus demonstrate no significant findings. Lungs/Pleura: A small ill-defined nodular opacity seen at the posterior right lung base measuring 1.4 cm, series 7, image 75. There is minimal bilateral ground-glass opacities. Tiny subpleural bleb formation seen at the periphery of both upper lungs. There is a calcified nodule seen within the right upper lung. No pleural effusion or large airspace consolidation. Upper Abdomen: Calcifications seen within the spleen. Musculoskeletal: No chest wall abnormality. No acute or significant osseous findings. Review of the  MIP images confirms the above findings. IMPRESSION: 1. No central, segmental, or subsegmental pulmonary embolism. 2. Findings suggestive of prior granulomatous disease. 3. 1.4 cm subpleural right lower lung nodule is seen. Follow-up non-contrast CT recommended at 3-6 months to confirm persistence. If unchanged, and solid component remains <6 mm, annual CT is recommended until 5 years of stability has been established. If persistent these nodules should be considered highly suspicious if the solid component of the nodule is 6 mm or greater in size and enlarging. This recommendation follows the consensus statement: Guidelines for Management of Incidental Pulmonary Nodules Detected on CT Images: From the Fleischner Society 2017; Radiology 2017; [phone]. 4. Electronically Signed   By: Prudencio Pair M.D.   On: 03/06/2020 22:15   CT Cervical Spine Wo Contrast  Result Date: 03/06/2020 CLINICAL DATA:  Syncopal episode. Fall. Trauma to back of head. EXAM: CT HEAD WITHOUT CONTRAST CT CERVICAL SPINE WITHOUT CONTRAST TECHNIQUE: Multidetector CT imaging of the head and cervical spine was performed following the standard protocol without intravenous contrast. Multiplanar CT image reconstructions of the cervical spine were also generated. COMPARISON:  MR head without contrast 02/07/2020. FINDINGS: CT HEAD FINDINGS Brain: No acute infarct, hemorrhage, or mass lesion is present. No significant white matter lesions are present. The ventricles are of normal size. No significant extraaxial fluid collection is present. The brainstem and cerebellum are within normal limits. Vascular: No hyperdense vessel or unexpected calcification. Skull: Calvarium is intact. No focal lytic or blastic lesions are present. Left parietal occipital scalp hematoma is present without underlying fracture. No foreign body is present. Sinuses/Orbits: The paranasal sinuses and mastoid air cells are clear. The globes and orbits are within normal limits. CT  CERVICAL SPINE FINDINGS Alignment: No significant listhesis is present. There is some straightening of the normal cervical lordosis. Skull base and vertebrae: Craniocervical junction is normal. Vertebral body heights are maintained. Endplate degenerative changes are present at C4-5 left greater than right. Vertebral body heights are maintained. No acute or healing fractures are present. Soft tissues and spinal canal: No prevertebral fluid or swelling. No visible canal hematoma. Disc levels: Uncovertebral spurring leads to moderate foraminal stenosis bilaterally at C4-5 right greater than left. Degenerative changes are present at C3-4 and C5-6 without significant stenosis at these levels. Upper chest: The lung apices are clear. Thoracic inlet is  within normal. IMPRESSION: 1. Left parietal occipital scalp hematoma without underlying fracture. 2. Normal CT appearance of the brain. 3. Multilevel degenerative changes of the cervical spine as described. Degenerative changes are greatest at C4-5. 4. No acute or healing fractures of the cervical spine. Electronically Signed   By: Marin Roberts M.D.   On: 03/06/2020 17:35   DG Chest Portable 1 View  Result Date: 03/06/2020 CLINICAL DATA:  Shortness of breath, fall, loss of consciousness EXAM: PORTABLE CHEST 1 VIEW COMPARISON:  Radiograph 02/08/2020 FINDINGS: There are chronically coarsened interstitial changes towards the lung bases. More patchy opacity is seen in the periphery of the right lung base though some increase in the attenuation of the lung periphery is likely related to body habitus. Calcified granuloma in the right upper lung is stable from priors. No visible pneumothorax or effusion. Cardiomediastinal contours are similar to prior. No acute osseous injury is seen. Remote posttraumatic deformity of the distal left clavicle with plate and screw fixation and left rotator cuff repair. Telemetry leads overlie the chest. IMPRESSION: 1. Patchy opacity in  the right lung base periphery. Nonspecific but could reflect infection or inflammation versus contusive change in the setting of fall. Appears distinct from the more generalized increased peripheral attenuation secondary to body habitus. 2. Chronically coarsened interstitial changes. 3. No other acute cardiopulmonary or traumatic findings in the chest. 4. Stable calcified granuloma in the right upper lung. Electronically Signed   By: Kreg Shropshire M.D.   On: 03/06/2020 18:17    Procedures Procedures (including critical care time)  Medications Ordered in ED Medications  QUEtiapine (SEROQUEL) tablet 100 mg (100 mg Oral Given 03/06/20 2348)  DULoxetine (CYMBALTA) DR capsule 60 mg (has no administration in time range)  gabapentin (NEURONTIN) capsule 400 mg (400 mg Oral Given 03/06/20 2346)  LORazepam (ATIVAN) tablet 1-4 mg (has no administration in time range)    Or  LORazepam (ATIVAN) injection 1-4 mg (has no administration in time range)  thiamine tablet 100 mg (has no administration in time range)    Or  thiamine (B-1) injection 100 mg (has no administration in time range)  folic acid (FOLVITE) tablet 1 mg (has no administration in time range)  multivitamin with minerals tablet 1 tablet (has no administration in time range)  sodium chloride flush (NS) 0.9 % injection 3 mL (3 mLs Intravenous Given 03/06/20 2344)  acetaminophen (TYLENOL) tablet 650 mg (has no administration in time range)    Or  acetaminophen (TYLENOL) suppository 650 mg (has no administration in time range)  ondansetron (ZOFRAN) tablet 4 mg (has no administration in time range)    Or  ondansetron (ZOFRAN) injection 4 mg (has no administration in time range)  acetaminophen (TYLENOL) tablet 650 mg (650 mg Oral Given 03/06/20 1857)  iohexol (OMNIPAQUE) 350 MG/ML injection 80 mL (80 mLs Intravenous Contrast Given 03/06/20 2158)    ED Course  I have reviewed the triage vital signs and the nursing notes.  Pertinent labs & imaging  results that were available during my care of the patient were reviewed by me and considered in my medical decision making (see chart for details).    MDM Rules/Calculators/A&P                      On exam, patient has hematoma to the left occipital parietal scalp with associated tenderness.  There is no laceration.  There is no cervical spine tenderness.  Patient has no other areas of deformity or tenderness.  He does have generalized weakness throughout his extremities.  Per his significant other, this is due to deconditioning from recent hospitalization.  He has no areas of sensory loss.  He denies any paresthesias.  Given the mechanism, CT of head and neck will be ordered.  Labs we ordered to identify any possible underlying metabolic abnormality contributing to his weakness and/or syncope.  EKG shows normal sinus rhythm.  Lead III is notable for Q waves and T wave inversions. QRS complex is narrow.  There are no evidence of ST segment elevations or interval prolongations.   CT of cervical spine showed multilevel degenerative changes, however there was no acute injury.  CT of head showed left parieto-occipital scalp hematoma without evidence of underlying fracture or intracranial abnormality.  Chest x-ray showed improvement from prior studies, during his recent hospitalization for pneumonia.  There was a radiology finding of a patchy opacity in the right lung base periphery, that appears to be new.  Patient's partner does state that he has been short of breath recently.  Given his recent shortness of breath, generalized weakness, and syncopal episode, PE study was ordered.  CT of chest showed no evidence of PE.  There were incidental findings of prior granulomatous disease and a 1.4 cm subpleural right lower lung nodule.  There is no focal consolidation in right lower lobe.  Labs are notable for leukocytosis of 13.0.  This could be suggestive of infection, however it is also expected in setting of  recent trauma.  Antibiotics at this time not indicated.  Labs, including troponins, were normal.  Upon reassessment, patient denied any further symptoms while in the ED.  Patient was admitted to medicine service for high risk syncope.  Final Clinical Impression(s) / ED Diagnoses Final diagnoses:  Syncope and collapse    Rx / DC Orders ED Discharge Orders    None       Gloris Manchester, MD 03/07/20 1610    Tilden Fossa, MD 03/09/20 [phone]

## 2020-03-06 NOTE — H&P (Signed)
History and Physical    Donald Carroll ZOX:[id number] DOB: [birth year] DOA: 03/06/2020  PCP: Lavinia SharpsPlacey, Mary Ann, NP  Patient coming from: Home  I have personally briefly reviewed patient's [facility] medical records [facility]  Chief Complaint: Syncope, generalized weakness  HPI: Donald SaxWarren W Kmetz is a 54 y.o. male with medical history significant of prior EtOH and heroin abuse (none recently and none since discharge from admission last month), anxiety, depression, BPD1, PTSD, HTN.  Pt admitted recently from 4/13-4/22 critically ill during that admit with severe sepsis with hypotension, AMS, AKI with creat 6, ended up being due to a bilateral PNA (COVID negative though) that landed him on a vent in the ICU for 5 days.  Extubated 4/19.  Pt with generalized weakness, using a walker since time of discharge.  Today had syncope and fall, hit back of head.  Onset was sudden and without warning.  Brought in to ED.  No CP, no SOB.   ED Course: Overall looks much better than last time: creat nl, WBC 13k no other SIRS, BP nl, remainder of labs WNL.  CT head just a hematoma, no intracranial abnormality, CT neck no fracture.  CTA chest: no PE, just a 1.4cm R pulm nodule that they rec follow up in 6 months for.   Review of Systems: As per HPI, otherwise all review of systems negative.  Past Medical History:  Diagnosis Date  . Anxiety   . Depression   . Hypertension     No past surgical history on file.   reports that he has been smoking cigarettes. He has been smoking about 1.00 pack per day. He has never used smokeless tobacco. He reports current alcohol use. He reports current drug use. Drug: Heroin.  No Known Allergies  Family History  Problem Relation Age of Onset  . Hypertension Mother   . Hypertension Father      Prior to Admission medications   Medication Sig Start Date End Date Taking? Authorizing Provider  DULoxetine (CYMBALTA) 60 MG capsule Take 60 mg by mouth daily.    Yes [provider]  gabapentin (NEURONTIN) 400 MG capsule Take 400 mg by mouth 3 (three) times daily.   Yes [provider]  QUEtiapine (SEROQUEL) 100 MG tablet Take 1 tablet (100 mg total) by mouth at bedtime. 02/13/20  Yes Noralee Stainhoi, Jennifer, DO  cloNIDine (CATAPRES) 0.1 MG tablet Take 1 tablet (0.1 mg total) by mouth 2 (two) times daily for 2 doses. Take one evening of 4/22, take one morning of 4/23 02/13/20 02/14/20  Noralee Stainhoi, Jennifer, DO    Physical Exam: Vitals:   03/06/20 1630 03/06/20 1730 03/06/20 1815 03/06/20 1845  BP: 117/85     Pulse: 90 88 91 87  Resp: (!) 23 (!) 21 (!) 24 (!) 22  Temp:      TempSrc:      SpO2: 96% 97% 96% 97%  Weight:      Height:        Constitutional: NAD, calm, comfortable Eyes: PERRL, lids and conjunctivae normal ENMT: Mucous membranes are moist. Posterior pharynx clear of any exudate or lesions.Normal dentition.  Neck: normal, supple, no masses, no thyromegaly Respiratory: clear to auscultation bilaterally, no wheezing, no crackles. Normal respiratory effort. No accessory muscle use.  Cardiovascular: Regular rate and rhythm, no murmurs / rubs / gallops. No extremity edema. 2+ pedal pulses. No carotid bruits.  Abdomen: no tenderness, no masses palpated. No hepatosplenomegaly. Bowel sounds positive.  Musculoskeletal: no clubbing / cyanosis.  No joint deformity upper and lower extremities. Good ROM, no contractures. Normal muscle tone.  Skin: no rashes, lesions, ulcers. No induration Neurologic: CN 2-12 grossly intact. Sensation intact, DTR normal. Strength 5/5 in all 4.  Psychiatric: Normal judgment and insight. Alert and oriented x 3. Normal mood.    Labs on Admission: I have personally reviewed following labs and imaging studies  CBC: Recent Labs  Lab 03/06/20 1611 03/06/20 1620  WBC 13.0*  --   NEUTROABS 10.6*  --   HGB 15.2 15.6  HCT 45.7 46.0  MCV 95.8  --   PLT 244  --    Basic Metabolic Panel: Recent Labs  Lab  03/06/20 1611 03/06/20 1620  NA 137 139  K 4.2 4.2  CL 101 102  CO2 25  --   GLUCOSE 87 82  BUN 14 14  CREATININE 1.04 0.90  CALCIUM 9.9  --   MG 2.1  --    GFR: Estimated Creatinine Clearance: 109.7 mL/min (by C-G formula based on SCr of 0.9 mg/dL). Liver Function Tests: Recent Labs  Lab 03/06/20 1611  AST 27  ALT 34  ALKPHOS 84  BILITOT 0.7  PROT 7.6  ALBUMIN 4.0   No results for input(s): LIPASE, AMYLASE in the last 168 hours. No results for input(s): AMMONIA in the last 168 hours. Coagulation Profile: Recent Labs  Lab 03/06/20 1611  INR 1.0   Cardiac Enzymes: No results for input(s): CKTOTAL, CKMB, CKMBINDEX, TROPONINI in the last 168 hours. BNP (last 3 results) No results for input(s): PROBNP in the last 8760 hours. HbA1C: No results for input(s): HGBA1C in the last 72 hours. CBG: Recent Labs  Lab 03/06/20 1705  GLUCAP 88   Lipid Profile: No results for input(s): CHOL, HDL, LDLCALC, TRIG, CHOLHDL, LDLDIRECT in the last 72 hours. Thyroid Function Tests: No results for input(s): TSH, T4TOTAL, FREET4, T3FREE, THYROIDAB in the last 72 hours. Anemia Panel: No results for input(s): VITAMINB12, FOLATE, FERRITIN, TIBC, IRON, RETICCTPCT in the last 72 hours. Urine analysis:    Component Value Date/Time   COLORURINE AMBER (A) 02/04/2020 1759   APPEARANCEUR HAZY (A) 02/04/2020 1759   LABSPEC 1.017 02/04/2020 1759   PHURINE 5.0 02/04/2020 1759   GLUCOSEU NEGATIVE 02/04/2020 1759   HGBUR SMALL (A) 02/04/2020 1759   BILIRUBINUR NEGATIVE 02/04/2020 1759   KETONESUR NEGATIVE 02/04/2020 1759   PROTEINUR 30 (A) 02/04/2020 1759   NITRITE NEGATIVE 02/04/2020 1759   LEUKOCYTESUR NEGATIVE 02/04/2020 1759    Radiological Exams on Admission: CT Head Wo Contrast  Result Date: 03/06/2020 CLINICAL DATA:  Syncopal episode. Fall. Trauma to back of head. EXAM: CT HEAD WITHOUT CONTRAST CT CERVICAL SPINE WITHOUT CONTRAST TECHNIQUE: Multidetector CT imaging of the head and  cervical spine was performed following the standard protocol without intravenous contrast. Multiplanar CT image reconstructions of the cervical spine were also generated. COMPARISON:  MR head without contrast 02/07/2020. FINDINGS: CT HEAD FINDINGS Brain: No acute infarct, hemorrhage, or mass lesion is present. No significant white matter lesions are present. The ventricles are of normal size. No significant extraaxial fluid collection is present. The brainstem and cerebellum are within normal limits. Vascular: No hyperdense vessel or unexpected calcification. Skull: Calvarium is intact. No focal lytic or blastic lesions are present. Left parietal occipital scalp hematoma is present without underlying fracture. No foreign body is present. Sinuses/Orbits: The paranasal sinuses and mastoid air cells are clear. The globes and orbits are within normal limits. CT CERVICAL SPINE FINDINGS Alignment: No significant listhesis is  present. There is some straightening of the normal cervical lordosis. Skull base and vertebrae: Craniocervical junction is normal. Vertebral body heights are maintained. Endplate degenerative changes are present at C4-5 left greater than right. Vertebral body heights are maintained. No acute or healing fractures are present. Soft tissues and spinal canal: No prevertebral fluid or swelling. No visible canal hematoma. Disc levels: Uncovertebral spurring leads to moderate foraminal stenosis bilaterally at C4-5 right greater than left. Degenerative changes are present at C3-4 and C5-6 without significant stenosis at these levels. Upper chest: The lung apices are clear. Thoracic inlet is within normal. IMPRESSION: 1. Left parietal occipital scalp hematoma without underlying fracture. 2. Normal CT appearance of the brain. 3. Multilevel degenerative changes of the cervical spine as described. Degenerative changes are greatest at C4-5. 4. No acute or healing fractures of the cervical spine. Electronically  Signed   By: Marin Roberts M.D.   On: 03/06/2020 17:35   CT Angio Chest PE W and/or Wo Contrast  Result Date: 03/06/2020 CLINICAL DATA:  Shortness of breath and syncopal episode EXAM: CT ANGIOGRAPHY CHEST WITH CONTRAST TECHNIQUE: Multidetector CT imaging of the chest was performed using the standard protocol during bolus administration of intravenous contrast. Multiplanar CT image reconstructions and MIPs were obtained to evaluate the vascular anatomy. CONTRAST:  70mL OMNIPAQUE IOHEXOL 350 MG/ML SOLN COMPARISON:  None. FINDINGS: Cardiovascular: There is a optimal opacification of the pulmonary arteries. There is no central,segmental, or subsegmental filling defects within the pulmonary arteries. The heart is normal in size. No pericardial effusion or thickening. No evidence right heart strain. There is normal three-vessel brachiocephalic anatomy without proximal stenosis. The thoracic aorta is normal in appearance. Mediastinum/Nodes: No hilar, mediastinal, or axillary adenopathy. There are however partially calcified lymph nodes within the anterior pretracheal and right hilar space. Thyroid gland, trachea, and esophagus demonstrate no significant findings. Lungs/Pleura: A small ill-defined nodular opacity seen at the posterior right lung base measuring 1.4 cm, series 7, image 75. There is minimal bilateral ground-glass opacities. Tiny subpleural bleb formation seen at the periphery of both upper lungs. There is a calcified nodule seen within the right upper lung. No pleural effusion or large airspace consolidation. Upper Abdomen: Calcifications seen within the spleen. Musculoskeletal: No chest wall abnormality. No acute or significant osseous findings. Review of the MIP images confirms the above findings. IMPRESSION: 1. No central, segmental, or subsegmental pulmonary embolism. 2. Findings suggestive of prior granulomatous disease. 3. 1.4 cm subpleural right lower lung nodule is seen. Follow-up  non-contrast CT recommended at 3-6 months to confirm persistence. If unchanged, and solid component remains <6 mm, annual CT is recommended until 5 years of stability has been established. If persistent these nodules should be considered highly suspicious if the solid component of the nodule is 6 mm or greater in size and enlarging. This recommendation follows the consensus statement: Guidelines for Management of Incidental Pulmonary Nodules Detected on CT Images: From the Fleischner Society 2017; Radiology 2017; [phone]. 4. Electronically Signed   By: Jonna Clark M.D.   On: 03/06/2020 22:15   CT Cervical Spine Wo Contrast  Result Date: 03/06/2020 CLINICAL DATA:  Syncopal episode. Fall. Trauma to back of head. EXAM: CT HEAD WITHOUT CONTRAST CT CERVICAL SPINE WITHOUT CONTRAST TECHNIQUE: Multidetector CT imaging of the head and cervical spine was performed following the standard protocol without intravenous contrast. Multiplanar CT image reconstructions of the cervical spine were also generated. COMPARISON:  MR head without contrast 02/07/2020. FINDINGS: CT HEAD FINDINGS Brain: No acute infarct,  hemorrhage, or mass lesion is present. No significant white matter lesions are present. The ventricles are of normal size. No significant extraaxial fluid collection is present. The brainstem and cerebellum are within normal limits. Vascular: No hyperdense vessel or unexpected calcification. Skull: Calvarium is intact. No focal lytic or blastic lesions are present. Left parietal occipital scalp hematoma is present without underlying fracture. No foreign body is present. Sinuses/Orbits: The paranasal sinuses and mastoid air cells are clear. The globes and orbits are within normal limits. CT CERVICAL SPINE FINDINGS Alignment: No significant listhesis is present. There is some straightening of the normal cervical lordosis. Skull base and vertebrae: Craniocervical junction is normal. Vertebral body heights are maintained.  Endplate degenerative changes are present at C4-5 left greater than right. Vertebral body heights are maintained. No acute or healing fractures are present. Soft tissues and spinal canal: No prevertebral fluid or swelling. No visible canal hematoma. Disc levels: Uncovertebral spurring leads to moderate foraminal stenosis bilaterally at C4-5 right greater than left. Degenerative changes are present at C3-4 and C5-6 without significant stenosis at these levels. Upper chest: The lung apices are clear. Thoracic inlet is within normal. IMPRESSION: 1. Left parietal occipital scalp hematoma without underlying fracture. 2. Normal CT appearance of the brain. 3. Multilevel degenerative changes of the cervical spine as described. Degenerative changes are greatest at C4-5. 4. No acute or healing fractures of the cervical spine. Electronically Signed   By: San Morelle M.D.   On: 03/06/2020 17:35   DG Chest Portable 1 View  Result Date: 03/06/2020 CLINICAL DATA:  Shortness of breath, fall, loss of consciousness EXAM: PORTABLE CHEST 1 VIEW COMPARISON:  Radiograph 02/08/2020 FINDINGS: There are chronically coarsened interstitial changes towards the lung bases. More patchy opacity is seen in the periphery of the right lung base though some increase in the attenuation of the lung periphery is likely related to body habitus. Calcified granuloma in the right upper lung is stable from priors. No visible pneumothorax or effusion. Cardiomediastinal contours are similar to prior. No acute osseous injury is seen. Remote posttraumatic deformity of the distal left clavicle with plate and screw fixation and left rotator cuff repair. Telemetry leads overlie the chest. IMPRESSION: 1. Patchy opacity in the right lung base periphery. Nonspecific but could reflect infection or inflammation versus contusive change in the setting of fall. Appears distinct from the more generalized increased peripheral attenuation secondary to body  habitus. 2. Chronically coarsened interstitial changes. 3. No other acute cardiopulmonary or traumatic findings in the chest. 4. Stable calcified granuloma in the right upper lung. Electronically Signed   By: Lovena Le M.D.   On: 03/06/2020 18:17    EKG: Independently reviewed.  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 Syncope -  1. Syncope pathway 2. 2d echo 3. Tele monitor 2. Critical illness neuropathy - 1. Clinical diagnosis at this point: 1. Generalized weakness probably due to this given HPI 2. PT/OT eval and treat 3. Could consider EMG for formal diagnosis if needed 3. H/o EtOH abuse - 1. None since last admit per pt 2. Will put on CIWA just-in-case 4. H/o Heroin abuse in past - none recently 1. UDS pending 5. PTSD, BPD - cont home meds 6. Pulmonary nodule - 1. Needs f/u CT in 6 months 2. Hopefully just an  area of scaring from the recent very severe PNA last month.  DVT prophylaxis: SCDs for now - due to hematoma, start lovenox if not discharged tomorrow Code Status: Full Family Communication: No family in room Disposition Plan: Home after syncope r/o, possibly tomorrow Consults called: None Admission status: Place in 25   Roslin Norwood M. DO [facility]  How to contact the [facility] Attending or Consulting provider 7A - 7P or covering provider during after hours 7P -7A, for this patient?  1. Check the care team in [facility] and look for a) attending/consulting [facility] provider listed and b) the [facility] team listed 2. Log into [email or web address]  Amion Physician Scheduling and messaging for groups and whole hospitals  On call and physician scheduling software for group practices, residents, hospitalists and other medical providers for call, clinic, rotation and shift schedules. On[employer] is  a hospital-wide system for scheduling doctors and paging doctors on call. EasyPlot is for scientific plotting and data analysis.  [email or web address]  and use [place]'s universal password to access. If you do not have the password, please contact the hospital operator.  3. Locate the [facility] provider you are looking for under [facility] and page to a number that you can be directly reached. 4. If you still have difficulty reaching the provider, please page the [facility] (Director on Call) for the Hospitalists listed on amion for assistance.  03/06/2020, 11:04 PM

## 2020-03-06 NOTE — ED Notes (Signed)
c collar removed per MD instructions.

## 2020-03-06 NOTE — ED Triage Notes (Signed)
Pt at [facility] with SO and had reported syncopal episode and fell striking back of head. Hx of recent admit to [facility] and has had difficulty with ambulation since.  NML for him is altered since last admission. + loc with fall, 3 seconds.

## 2020-03-07 ENCOUNTER — Observation Stay (HOSPITAL_BASED_OUTPATIENT_CLINIC_OR_DEPARTMENT_OTHER): Payer: Self-pay

## 2020-03-07 DIAGNOSIS — R55 Syncope and collapse: Secondary | ICD-10-CM

## 2020-03-07 LAB — RAPID URINE DRUG SCREEN, HOSP PERFORMED
Amphetamines: NOT DETECTED
Barbiturates: NOT DETECTED
Benzodiazepines: NOT DETECTED
Cocaine: NOT DETECTED
Opiates: NOT DETECTED
Tetrahydrocannabinol: POSITIVE — AB

## 2020-03-07 LAB — CBC
HCT: 43.5 % (ref 39.0–52.0)
Hemoglobin: 14.1 g/dL (ref 13.0–17.0)
MCH: 31.3 pg (ref 26.0–34.0)
MCHC: 32.4 g/dL (ref 30.0–36.0)
MCV: 96.5 fL (ref 80.0–100.0)
Platelets: 213 10*3/uL (ref 150–400)
RBC: 4.51 MIL/uL (ref 4.22–5.81)
RDW: 13.5 % (ref 11.5–15.5)
WBC: 8.4 10*3/uL (ref 4.0–10.5)
nRBC: 0 % (ref 0.0–0.2)

## 2020-03-07 LAB — BASIC METABOLIC PANEL
Anion gap: 9 (ref 5–15)
BUN: 9 mg/dL (ref 6–20)
CO2: 24 mmol/L (ref 22–32)
Calcium: 9.3 mg/dL (ref 8.9–10.3)
Chloride: 105 mmol/L (ref 98–111)
Creatinine, Ser: 1.01 mg/dL (ref 0.61–1.24)
GFR calc Af Amer: >60 mL/min (ref >60)
GFR calc non Af Amer: >60 mL/min (ref >60)
Glucose, Bld: 90 mg/dL (ref 70–99)
Potassium: 4.2 mmol/L (ref 3.5–5.1)
Sodium: 138 mmol/L (ref 135–145)

## 2020-03-07 LAB — ECHOCARDIOGRAM COMPLETE
Height: 73 in
Weight: 3319.25 oz

## 2020-03-07 LAB — ETHANOL: Alcohol, Ethyl (B): <10 mg/dL (ref <10)

## 2020-03-07 LAB — CBG MONITORING, ED: Glucose-Capillary: 90 mg/dL (ref 70–99)

## 2020-03-07 NOTE — ED Notes (Signed)
Upon sitting patient complained of dizziness. Once he stood with this NT and RN, he started swaying and stuttering saying he was too dizzy.

## 2020-03-07 NOTE — Progress Notes (Signed)
PROGRESS NOTE    Donald Carroll  VZD:[id number] DOB: Apr 17, 1966 DOA: 03/06/2020 PCP: Marliss Coots, NP    Brief Narrative:  54 year old gentleman with history of alcohol and heroin abuse, recent ICU admission for severe sepsis hypotension and altered mental status status post mechanical ventilation, discharged home with improvement.  He has been using a walker at home since that time and has stopped drinking alcohol or doing drugs.  Patient does occasionally get dizziness while walking.  Patient was shopping at [facility], felt dizzy after standing for some time, blacked out and fell on his back of the head.  Witnessed by his significant other, unconscious for about 1 to 2 minutes, slightly confused afterwards.  No witnessed seizure.   Assessment & Plan:   Principal Problem:   Syncope Active Problems:   PTSD (post-traumatic stress disorder)   Pulmonary nodule   History of ETOH abuse   History of heroin abuse (HCC)   Critical illness neuropathy (HCC)  Syncope: Suspect vasovagal, postural or orthostatic syncope.  Patient with recent complicated hospitalization and physical deconditioning. EKG normal.  Telemetry with no evidence of arrhythmias.  2D echocardiogram pending.  Skeletal survey including CT head and CT cervical spine were normal with some evidence of hematoma.  No evidence of pulmonary embolism. Check orthostatic. Keep on telemetry today, will monitor for any arrhythmias. Mobilize with PT OT.  Critical illness neuropathy/history of alcohol use/PTSD and bipolar: Patient is on Seroquel and Celexa that he will continue.  Will monitor for orthostatic changes.  Pulmonary nodule: Probably from recent infection.  Will suggest outpatient follow-up.   DVT prophylaxis: SCD Code Status: Full code Family Communication: None Disposition Plan: Status is: Observation  The patient remains OBS appropriate and will d/c before 2 midnights.  Dispo: The patient is from: Home          Anticipated d/c is to: Home              Anticipated d/c date is: 1 day              Patient currently is not medically stable to d/c.  Patient admitted overnight with syncopal episode.  His work-up is pending.  Echocardiogram is pending.  He is a still in the emergency room and has not been able to work with therapies yet.         Consultants:   None  Procedures:   None  Antimicrobials:   None   Subjective: Patient seen and examined.  Still remains in the emergency room.  Denies any complaints at rest.  He has not mobilized yet.  Denies any headache nausea or vomiting.  Not checked for orthostatic vitals yet.  Objective: Vitals:   03/07/20 1030 03/07/20 1045 03/07/20 1100 03/07/20 1115  BP: 126/90 (!) 121/91 101/74   Pulse: 74 71 81 89  Resp:      Temp:      TempSrc:      SpO2: 94% 99% 96% 99%  Weight:      Height:        Intake/Output Summary (Last 24 hours) at 03/07/2020 1131 Last data filed at 03/07/2020 0003 Gross per 24 hour  Intake --  Output 500 ml  Net -500 ml   Filed Weights   03/06/20 1535  Weight: 94.1 kg    Examination:  General exam: Appears calm and comfortable, on room air. Respiratory system: Clear to auscultation. Respiratory effort normal. Cardiovascular system: S1 & S2 heard, RRR. No JVD, murmurs, rubs, gallops  or clicks. No pedal edema. Gastrointestinal system: Abdomen is nondistended, soft and nontender. No organomegaly or masses felt. Normal bowel sounds heard. Central nervous system: Alert and oriented. No focal neurological deficits. Extremities: Symmetric 5 x 5 power. Gait not examined. Skin: No rashes, lesions or ulcers Psychiatry: Judgement and insight appear normal. Mood & affect appropriate.     Data Reviewed: I have personally reviewed following labs and imaging studies  CBC: Recent Labs  Lab 03/06/20 1611 03/06/20 1620 03/07/20 0500  WBC 13.0*  --  8.4  NEUTROABS 10.6*  --   --   HGB 15.2 15.6 14.1  HCT  45.7 46.0 43.5  MCV 95.8  --  96.5  PLT 244  --  213   Basic Metabolic Panel: Recent Labs  Lab 03/06/20 1611 03/06/20 1620 03/07/20 0500  NA 137 139 138  K 4.2 4.2 4.2  CL 101 102 105  CO2 25  --  24  GLUCOSE 87 82 90  BUN 14 14 9   CREATININE 1.04 0.90 1.01  CALCIUM 9.9  --  9.3  MG 2.1  --   --    GFR: Estimated Creatinine Clearance: 97.8 mL/min (by C-G formula based on SCr of 1.01 mg/dL). Liver Function Tests: Recent Labs  Lab 03/06/20 1611  AST 27  ALT 34  ALKPHOS 84  BILITOT 0.7  PROT 7.6  ALBUMIN 4.0   No results for input(s): LIPASE, AMYLASE in the last 168 hours. No results for input(s): AMMONIA in the last 168 hours. Coagulation Profile: Recent Labs  Lab 03/06/20 1611  INR 1.0   Cardiac Enzymes: No results for input(s): CKTOTAL, CKMB, CKMBINDEX, TROPONINI in the last 168 hours. BNP (last 3 results) No results for input(s): PROBNP in the last 8760 hours. HbA1C: No results for input(s): HGBA1C in the last 72 hours. CBG: Recent Labs  Lab 03/06/20 1705 03/07/20 0547  GLUCAP 88 90   Lipid Profile: No results for input(s): CHOL, HDL, LDLCALC, TRIG, CHOLHDL, LDLDIRECT in the last 72 hours. Thyroid Function Tests: No results for input(s): TSH, T4TOTAL, FREET4, T3FREE, THYROIDAB in the last 72 hours. Anemia Panel: No results for input(s): VITAMINB12, FOLATE, FERRITIN, TIBC, IRON, RETICCTPCT in the last 72 hours. Sepsis Labs: No results for input(s): PROCALCITON, LATICACIDVEN in the last 168 hours.  Recent Results (from the past 240 hour(s))  SARS Coronavirus 2 by RT PCR (hospital order, performed in [facility] hospital lab) Nasopharyngeal Nasopharyngeal Swab     Status: None   Collection Time: 03/06/20  5:16 PM   Specimen: Nasopharyngeal Swab  Result Value Ref Range Status   SARS Coronavirus 2 NEGATIVE NEGATIVE Final    Comment: (NOTE) SARS-CoV-2 target nucleic acids are NOT DETECTED. The SARS-CoV-2 RNA is generally detectable in upper and  lower respiratory specimens during the acute phase of infection. The lowest concentration of SARS-CoV-2 viral copies this assay can detect is 250 copies / mL. A negative result does not preclude SARS-CoV-2 infection and should not be used as the sole basis for treatment or other patient management decisions.  A negative result may occur with improper specimen collection / handling, submission of specimen other than nasopharyngeal swab, presence of viral mutation(s) within the areas targeted by this assay, and inadequate number of viral copies (<250 copies / mL). A negative result must be combined with clinical observations, patient history, and epidemiological information. Fact Sheet for Patients:   03/08/20 Fact Sheet for Healthcare Providers: BoilerBrush.com.cy This test is not yet approved or cleared  by the  [place] [employer] and has been authorized for detection and/or diagnosis of SARS-CoV-2 by FDA under an TEFL teacher (EUA).  This EUA will remain in effect (meaning this test can be used) for the duration of the COVID-19 declaration under Section 564(b)(1) of the Act, 21 U.S.C. section 360bbb-3(b)(1), unless the authorization is terminated or revoked sooner. Performed at [facility], [address], [place], [place] [address]          Radiology Studies: CT Head Wo Contrast  Result Date: 03/06/2020 CLINICAL DATA:  Syncopal episode. Fall. Trauma to back of head. EXAM: CT HEAD WITHOUT CONTRAST CT CERVICAL SPINE WITHOUT CONTRAST TECHNIQUE: Multidetector CT imaging of the head and cervical spine was performed following the standard protocol without intravenous contrast. Multiplanar CT image reconstructions of the cervical spine were also generated. COMPARISON:  MR head without contrast 02/07/2020. FINDINGS: CT HEAD FINDINGS Brain: No acute infarct, hemorrhage, or mass lesion is present. No significant  white matter lesions are present. The ventricles are of normal size. No significant extraaxial fluid collection is present. The brainstem and cerebellum are within normal limits. Vascular: No hyperdense vessel or unexpected calcification. Skull: Calvarium is intact. No focal lytic or blastic lesions are present. Left parietal occipital scalp hematoma is present without underlying fracture. No foreign body is present. Sinuses/Orbits: The paranasal sinuses and mastoid air cells are clear. The globes and orbits are within normal limits. CT CERVICAL SPINE FINDINGS Alignment: No significant listhesis is present. There is some straightening of the normal cervical lordosis. Skull base and vertebrae: Craniocervical junction is normal. Vertebral body heights are maintained. Endplate degenerative changes are present at C4-5 left greater than right. Vertebral body heights are maintained. No acute or healing fractures are present. Soft tissues and spinal canal: No prevertebral fluid or swelling. No visible canal hematoma. Disc levels: Uncovertebral spurring leads to moderate foraminal stenosis bilaterally at C4-5 right greater than left. Degenerative changes are present at C3-4 and C5-6 without significant stenosis at these levels. Upper chest: The lung apices are clear. Thoracic inlet is within normal. IMPRESSION: 1. Left parietal occipital scalp hematoma without underlying fracture. 2. Normal CT appearance of the brain. 3. Multilevel degenerative changes of the cervical spine as described. Degenerative changes are greatest at C4-5. 4. No acute or healing fractures of the cervical spine. Electronically Signed   By: Marin Roberts M.D.   On: 03/06/2020 17:35   CT Angio Chest PE W and/or Wo Contrast  Result Date: 03/06/2020 CLINICAL DATA:  Shortness of breath and syncopal episode EXAM: CT ANGIOGRAPHY CHEST WITH CONTRAST TECHNIQUE: Multidetector CT imaging of the chest was performed using the standard protocol during  bolus administration of intravenous contrast. Multiplanar CT image reconstructions and MIPs were obtained to evaluate the vascular anatomy. CONTRAST:  68mL OMNIPAQUE IOHEXOL 350 MG/ML SOLN COMPARISON:  None. FINDINGS: Cardiovascular: There is a optimal opacification of the pulmonary arteries. There is no central,segmental, or subsegmental filling defects within the pulmonary arteries. The heart is normal in size. No pericardial effusion or thickening. No evidence right heart strain. There is normal three-vessel brachiocephalic anatomy without proximal stenosis. The thoracic aorta is normal in appearance. Mediastinum/Nodes: No hilar, mediastinal, or axillary adenopathy. There are however partially calcified lymph nodes within the anterior pretracheal and right hilar space. Thyroid gland, trachea, and esophagus demonstrate no significant findings. Lungs/Pleura: A small ill-defined nodular opacity seen at the posterior right lung base measuring 1.4 cm, series 7, image 75. There is minimal bilateral ground-glass opacities. Tiny subpleural bleb  formation seen at the periphery of both upper lungs. There is a calcified nodule seen within the right upper lung. No pleural effusion or large airspace consolidation. Upper Abdomen: Calcifications seen within the spleen. Musculoskeletal: No chest wall abnormality. No acute or significant osseous findings. Review of the MIP images confirms the above findings. IMPRESSION: 1. No central, segmental, or subsegmental pulmonary embolism. 2. Findings suggestive of prior granulomatous disease. 3. 1.4 cm subpleural right lower lung nodule is seen. Follow-up non-contrast CT recommended at 3-6 months to confirm persistence. If unchanged, and solid component remains <6 mm, annual CT is recommended until 5 years of stability has been established. If persistent these nodules should be considered highly suspicious if the solid component of the nodule is 6 mm or greater in size and enlarging.  This recommendation follows the consensus statement: Guidelines for Management of Incidental Pulmonary Nodules Detected on CT Images: From the Fleischner Society 2017; Radiology 2017; [phone]:[id number]. 4. Electronically Signed   By: Jonna ClarkBindu  Avutu M.D.   On: 03/06/2020 22:15   CT Cervical Spine Wo Contrast  Result Date: 03/06/2020 CLINICAL DATA:  Syncopal episode. Fall. Trauma to back of head. EXAM: CT HEAD WITHOUT CONTRAST CT CERVICAL SPINE WITHOUT CONTRAST TECHNIQUE: Multidetector CT imaging of the head and cervical spine was performed following the standard protocol without intravenous contrast. Multiplanar CT image reconstructions of the cervical spine were also generated. COMPARISON:  MR head without contrast 02/07/2020. FINDINGS: CT HEAD FINDINGS Brain: No acute infarct, hemorrhage, or mass lesion is present. No significant white matter lesions are present. The ventricles are of normal size. No significant extraaxial fluid collection is present. The brainstem and cerebellum are within normal limits. Vascular: No hyperdense vessel or unexpected calcification. Skull: Calvarium is intact. No focal lytic or blastic lesions are present. Left parietal occipital scalp hematoma is present without underlying fracture. No foreign body is present. Sinuses/Orbits: The paranasal sinuses and mastoid air cells are clear. The globes and orbits are within normal limits. CT CERVICAL SPINE FINDINGS Alignment: No significant listhesis is present. There is some straightening of the normal cervical lordosis. Skull base and vertebrae: Craniocervical junction is normal. Vertebral body heights are maintained. Endplate degenerative changes are present at C4-5 left greater than right. Vertebral body heights are maintained. No acute or healing fractures are present. Soft tissues and spinal canal: No prevertebral fluid or swelling. No visible canal hematoma. Disc levels: Uncovertebral spurring leads to moderate foraminal stenosis bilaterally  at C4-5 right greater than left. Degenerative changes are present at C3-4 and C5-6 without significant stenosis at these levels. Upper chest: The lung apices are clear. Thoracic inlet is within normal. IMPRESSION: 1. Left parietal occipital scalp hematoma without underlying fracture. 2. Normal CT appearance of the brain. 3. Multilevel degenerative changes of the cervical spine as described. Degenerative changes are greatest at C4-5. 4. No acute or healing fractures of the cervical spine. Electronically Signed   By: Marin Robertshristopher  Mattern M.D.   On: 03/06/2020 17:35   DG Chest Portable 1 View  Result Date: 03/06/2020 CLINICAL DATA:  Shortness of breath, fall, loss of consciousness EXAM: PORTABLE CHEST 1 VIEW COMPARISON:  Radiograph 02/08/2020 FINDINGS: There are chronically coarsened interstitial changes towards the lung bases. More patchy opacity is seen in the periphery of the right lung base though some increase in the attenuation of the lung periphery is likely related to body habitus. Calcified granuloma in the right upper lung is stable from priors. No visible pneumothorax or effusion. Cardiomediastinal contours are similar to prior.  No acute osseous injury is seen. Remote posttraumatic deformity of the distal left clavicle with plate and screw fixation and left rotator cuff repair. Telemetry leads overlie the chest. IMPRESSION: 1. Patchy opacity in the right lung base periphery. Nonspecific but could reflect infection or inflammation versus contusive change in the setting of fall. Appears distinct from the more generalized increased peripheral attenuation secondary to body habitus. 2. Chronically coarsened interstitial changes. 3. No other acute cardiopulmonary or traumatic findings in the chest. 4. Stable calcified granuloma in the right upper lung. Electronically Signed   By: Kreg Shropshire M.D.   On: 03/06/2020 18:17        Scheduled Meds: . DULoxetine  60 mg Oral Daily  . folic acid  1 mg Oral  Daily  . gabapentin  400 mg Oral TID  . multivitamin with minerals  1 tablet Oral Daily  . QUEtiapine  100 mg Oral QHS  . sodium chloride flush  3 mL Intravenous Q12H  . thiamine  100 mg Oral Daily   Or  . thiamine  100 mg Intravenous Daily   Continuous Infusions:   LOS: 0 days    Time spent: 25 minutes    Dorcas Carrow, MD [facility] Pager [phone]

## 2020-03-07 NOTE — Evaluation (Signed)
Occupational Therapy Evaluation Patient Details Name: Donald Carroll MRN: [id number] DOB: 08/14/1966 Today's Date: 03/07/2020    History of Present Illness Pt is a 54 y/o male admitted after sustaining a witnessed fall when he was at a store. +LOC. EKG normal.  Telemetry with no evidence of arrhythmias.  2D echocardiogram pending.  Skeletal survey including CT head and CT cervical spine were normal with some evidence of hematoma.  No evidence of pulmonary embolism. PMH including but not limited to EtOH and heroin abuse, anxiety, depression, BPD1, PTSD, HTN. Of note, pt with recent hospitalization in which he required mechanical ventilation on 4/13-4/22.   Clinical Impression   PTA pt living with spouse, with as needed assist for mobility and BADL. Pt had admission last month where he was intubated resulting in generalized weakness. Wife reports he has been walking with a RW and intermittent assist since this time. She shares that they did not qualify for [facility] services and need other options. At time of eval, pt completed sit <> stand with min guard assist and beyond household assist with RW at min guard level. Wife is very supportive and involved, willing to assist as needed. Do not anticipate further OT follow up, but will continue to follow while acute to progress BADL tolerance. Will continue to follow per POC listed below.    Follow Up Recommendations  No OT follow up;Supervision - Intermittent    Equipment Recommendations  3 in 1 bedside commode    Recommendations for Other Services       Precautions / Restrictions Precautions Precautions: Fall Restrictions Weight Bearing Restrictions: No      Mobility Bed Mobility Overal bed mobility: Modified Independent             General bed mobility comments: from end of stretcher  Transfers Overall transfer level: Needs assistance Equipment used: Rolling walker (2 wheeled) Transfers: Sit to/from Stand Sit to Stand: Min guard          General transfer comment: steady with transition, min guard for safety    Balance Overall balance assessment: Needs assistance;History of Falls Sitting-balance support: Feet supported Sitting balance-Leahy Scale: Good     Standing balance support: Bilateral upper extremity supported Standing balance-Leahy Scale: Poor Standing balance comment: reliant on external support from bil UEs                           ADL either performed or assessed with clinical judgement   ADL Overall ADL's : Needs assistance/impaired Eating/Feeding: Set up;Sitting   Grooming: Set up;Sitting;Standing   Upper Body Bathing: Set up;Sitting   Lower Body Bathing: Set up;Sitting/lateral leans;Sit to/from stand   Upper Body Dressing : Set up;Sitting   Lower Body Dressing: Set up;Sitting/lateral leans;Sit to/from stand Lower Body Dressing Details (indicate cue type and reason): to don bil socks Toilet Transfer: Min guard;RW;Ambulation;Regular Toilet;Grab bars   Toileting- Clothing Manipulation and Hygiene: Set up;Sitting/lateral lean;Sit to/from stand   Tub/ Shower Transfer: Min guard;Rolling walker;Ambulation;Shower seat   Functional mobility during ADLs: Min guard;Rolling walker;Cueing for safety       Vision Patient Visual Report: No change from baseline       Perception     Praxis      Pertinent Vitals/Pain Pain Assessment: Faces Faces Pain Scale: Hurts little more Pain Location: bilateral knees Pain Descriptors / Indicators: Sore Pain Intervention(s): Monitored during session;Repositioned     Hand Dominance     Extremity/Trunk Assessment Upper Extremity  Assessment Upper Extremity Assessment: Overall WFL for tasks assessed   Lower Extremity Assessment Lower Extremity Assessment: Defer to PT evaluation       Communication Communication Communication: No difficulties   Cognition Arousal/Alertness: Awake/alert Behavior During Therapy: Flat affect Overall  Cognitive Status: Impaired/Different from baseline Area of Impairment: Memory                     Memory: Decreased short-term memory         General Comments: pt stating he has no recollection of this recent fall and difficulties with memory regarding his previous fall in which he was admitted to the hospital    General Comments       Exercises     Shoulder Instructions      Home Living Family/patient expects to be discharged to:: Private residence Living Arrangements: Spouse/significant other Available Help at Discharge: Family;Available 24 hours/day Type of Home: House Home Access: Stairs to enter [employer] of Steps: several Entrance Stairs-Rails: Right;Left Home Layout: One level     Bathroom Shower/Tub: Tub/shower unit;Walk-in shower   Bathroom Toilet: Standard     Home Equipment: Walker - 2 wheels   Additional Comments: information was gathered from chart from recent admission. Pt wife then stated that pt is homeless but was not willing to elaborate?      Prior Functioning/Environment Level of Independence: Needs assistance  Gait / Transfers Assistance Needed: ambulates with RW and someone next to him for safety since previous admission ADL's / Homemaking Assistance Needed: requires assistance from wife            OT Problem List: Decreased knowledge of use of DME or AE;Decreased knowledge of precautions;Decreased activity tolerance;Impaired balance (sitting and/or standing)      OT Treatment/Interventions: Self-care/ADL training;Therapeutic exercise;Patient/family education;Balance training;Energy conservation;Therapeutic activities;DME and/or AE instruction    OT Goals(Current goals can be found in the care plan section) Acute Rehab OT Goals Patient Stated Goal: for his balance and strength to improve OT Goal Formulation: With patient/family Time For Goal Achievement: 03/21/20 Potential to Achieve Goals: Good  OT Frequency: Min  2X/week   Barriers to D/C:            Co-evaluation PT/OT/SLP Co-Evaluation/Treatment: Yes Reason for Co-Treatment: To address functional/ADL transfers   OT goals addressed during session: ADL's and self-care;Proper use of Adaptive equipment and DME;Strengthening/ROM      AM-PAC OT "6 Clicks" Daily Activity     Outcome Measure Help from another person eating meals?: None Help from another person taking care of personal grooming?: None Help from another person toileting, which includes using toliet, bedpan, or urinal?: A Little Help from another person bathing (including washing, rinsing, drying)?: A Little Help from another person to put on and taking off regular upper body clothing?: None   6 Click Score: 18   End of Session Equipment Utilized During Treatment: Gait belt;Rolling walker Nurse Communication: Mobility status  Activity Tolerance: Patient tolerated treatment well Patient left: in bed;with call bell/phone within reach;with family/visitor present  OT Visit Diagnosis: Unsteadiness on feet (R26.81);Other abnormalities of gait and mobility (R26.89)                Time: 4098-1191 OT Time Calculation (min): 21 min Charges:  OT General Charges $OT Visit: 1 Visit OT Evaluation $OT Eval Moderate Complexity: 1 Mod  Dalphine Handing, MSOT, OTR/L Acute Rehabilitation Services [facility] Office Number: [phone] Pager: [phone]  Dalphine Handing 03/07/2020, 3:44 PM

## 2020-03-07 NOTE — ED Notes (Signed)
Breakfast Ordered 

## 2020-03-07 NOTE — Evaluation (Signed)
Physical Therapy Evaluation Patient Details Name: Donald Carroll MRN: [id number] DOB: 1966/03/28 Today's Date: 03/07/2020   History of Present Illness  Pt is a 54 y/o male admitted after sustaining a witnessed fall when he was at a store. +LOC. EKG normal.  Telemetry with no evidence of arrhythmias.  2D echocardiogram pending.  Skeletal survey including CT head and CT cervical spine were normal with some evidence of hematoma.  No evidence of pulmonary embolism. PMH including but not limited to EtOH and heroin abuse, anxiety, depression, BPD1, PTSD, HTN. Of note, pt with recent hospitalization in which he required mechanical ventilation on 4/13-4/22.    Clinical Impression  Pt presented supine in bed with HOB elevated, awake and willing to participate in therapy sessio. Pt's spouse present throughout session as well. Very engaged and attentive. Pt's wife reporting that since his previous admission, pt was supposed to receive HHPT services; however, she was told by the [facility] agency that the pt did not qualify for the services. Pt's spouse has been assisting him with ADL's and works with him on gait training with use of RW. At the time of evaluation, pt at a min guard level with transfers and ambulation with RW. He was asymptomatic throughout. VSS throughout as well. PT recommending OPPT services so that hopefully pt will actually receive f/u PT once discharged. Pt and pt's spouse agreeable. Pt would continue to benefit from skilled physical therapy services at this time while admitted and after d/c to address the below listed limitations in order to improve overall safety and independence with functional mobility.  BP sitting at beginning of session = 118/91 BP in standing = 116/91 BP sitting after ambulation = 119/94    Follow Up Recommendations Outpatient PT    Equipment Recommendations  None recommended by PT    Recommendations for Other Services       Precautions / Restrictions  Precautions Precautions: Fall Restrictions Weight Bearing Restrictions: No      Mobility  Bed Mobility Overal bed mobility: Modified Independent                Transfers Overall transfer level: Needs assistance Equipment used: Rolling walker (2 wheeled) Transfers: Sit to/from Stand Sit to Stand: Min guard         General transfer comment: steady with transition, min guard for safety  Ambulation/Gait Ambulation/Gait assistance: Min guard Gait Distance (Feet): 100 Feet Assistive device: Rolling walker (2 wheeled) Gait Pattern/deviations: Step-through pattern;Decreased stride length Gait velocity: decreased   General Gait Details: pt with mild instability but no overt LOB or need for physical assistance, min guard for safety  Stairs            Wheelchair Mobility    Modified Rankin (Stroke Patients Only)       Balance Overall balance assessment: Needs assistance;History of Falls Sitting-balance support: Feet supported Sitting balance-Leahy Scale: Good     Standing balance support: Bilateral upper extremity supported Standing balance-Leahy Scale: Poor                               Pertinent Vitals/Pain Pain Assessment: Faces Faces Pain Scale: Hurts little more Pain Location: bilateral knees Pain Descriptors / Indicators: Sore Pain Intervention(s): Monitored during session;Repositioned    Home Living Family/patient expects to be discharged to:: Private residence Living Arrangements: Spouse/significant other Available Help at Discharge: Family;Available 24 hours/day Type of Home: House Home Access: Stairs to enter Entrance Stairs-Rails:  Right;Left Entrance Stairs-Number of Steps: several Home Layout: One level Home Equipment: Walker - 2 wheels Additional Comments: information was gathered from chart from recent admission    Prior Function Level of Independence: Needs assistance   Gait / Transfers Assistance Needed: ambulates  with RW and someone next to him for safety since previous admission  ADL's / Homemaking Assistance Needed: requires assistance from wife        Hand Dominance        Extremity/Trunk Assessment   Upper Extremity Assessment Upper Extremity Assessment: Defer to OT evaluation;Overall [facility] for tasks assessed    Lower Extremity Assessment Lower Extremity Assessment: Overall WFL for tasks assessed       Communication   Communication: No difficulties  Cognition Arousal/Alertness: Awake/alert Behavior During Therapy: Flat affect Overall Cognitive Status: Impaired/Different from baseline Area of Impairment: Memory                     Memory: Decreased short-term memory         General Comments: pt stating he has no recollection of this recent fall and difficulties with memory regarding his previous fall in which he was admitted to the hospital       General Comments      Exercises     Assessment/Plan    PT Assessment Patient needs continued PT services  PT Problem List Decreased balance;Decreased mobility;Decreased coordination;Decreased knowledge of use of DME;Decreased safety awareness;Decreased knowledge of precautions;Decreased cognition       PT Treatment Interventions DME instruction;Gait training;Stair training;Functional mobility training;Therapeutic activities;Balance training;Therapeutic exercise;Neuromuscular re-education;Patient/family education;Cognitive remediation    PT Goals (Current goals can be found in the Care Plan section)  Acute Rehab PT Goals Patient Stated Goal: for his balance and strength to improve PT Goal Formulation: With patient/family Time For Goal Achievement: 03/21/20 Potential to Achieve Goals: Good    Frequency Min 3X/week   Barriers to discharge        Co-evaluation PT/OT/SLP Co-Evaluation/Treatment: Yes Reason for Co-Treatment: To address functional/ADL transfers PT goals addressed during session: Mobility/safety  with mobility;Balance;Strengthening/ROM;Proper use of DME         AM-PAC PT "6 Clicks" Mobility  Outcome Measure Help needed turning from your back to your side while in a flat bed without using bedrails?: None Help needed moving from lying on your back to sitting on the side of a flat bed without using bedrails?: None Help needed moving to and from a bed to a chair (including a wheelchair)?: A Little Help needed standing up from a chair using your arms (e.g., wheelchair or bedside chair)?: None Help needed to walk in hospital room?: A Little Help needed climbing 3-5 steps with a railing? : A Lot 6 Click Score: 20    End of Session Equipment Utilized During Treatment: Gait belt Activity Tolerance: Patient tolerated treatment well Patient left: in bed;with call bell/phone within reach;with family/visitor present Nurse Communication: Mobility status PT Visit Diagnosis: Other abnormalities of gait and mobility (R26.89)    Time: 1108-1130 PT Time Calculation (min) (ACUTE ONLY): 22 min   Charges:   PT Evaluation $PT Eval Low Complexity: 1 Low          Eduard Clos, PT, DPT  Acute Rehabilitation Services Pager [phone] Office [place] 03/07/2020, 12:08 PM

## 2020-03-07 NOTE — Progress Notes (Signed)
  Echocardiogram 2D Echocardiogram has been performed.  Roosvelt Maser F 03/07/2020, 11:12 AM

## 2020-03-08 ENCOUNTER — Encounter (HOSPITAL_COMMUNITY): Payer: Self-pay | Admitting: Internal Medicine

## 2020-03-08 LAB — GLUCOSE, CAPILLARY: Glucose-Capillary: 88 mg/dL (ref 70–99)

## 2020-03-08 NOTE — Progress Notes (Signed)
Nurse attempted to discharged patient but the wife said that,someone came in this morning to have her husband learned some exercise.Nurse checked the e-chart and said to her that nobody went in here this morning but he was seen by PT/OT yesterday.Wife said that '' I did not say this morning,it was yesterday.Nurse listen to the wife and she said that  PT/OT  Would come to see him back.Nurse explained PT/OT note  On epic chart to patient's wife.Patient requested to see P.T. Nurse said he is being discharged now and did well on assessment yesterday.

## 2020-03-08 NOTE — Care Management (Signed)
Referral placed for outpatient PT.

## 2020-03-08 NOTE — Progress Notes (Signed)
DISCHARGE NOTE HOME CHIMAOBI CASEBOLT to be discharged home per MD order. Discussed prescriptions and follow up appointments with the patient. Prescriptions given to patient; medication list explained in detail. Patient verbalized understanding.  Skin clean, dry and intact without evidence of skin break down, no evidence of skin tears noted. IV catheter discontinued intact. Site without signs and symptoms of complications. Dressing and pressure applied. Pt denies pain at the site currently. No complaints noted.  Patient free of lines, drains, and wounds. P.T came and saw the patient and his wife.  An After Visit Summary (AVS) was printed and given to the patient. Patient escorted via wheelchair, and discharged home via private auto.  [place], Kem Kays, RN

## 2020-03-08 NOTE — Progress Notes (Signed)
Patient was able to ambulate on the entire hallway back and fort to his room ,on  moderate pacing steady gait with a front rolling walker,without any dizziness,blurred vision,chest discomfort nor shortness of breathing.

## 2020-03-08 NOTE — Progress Notes (Signed)
Primary MD, came and explained to patient that they don't need to have a physical therapy to see them ,made them aware that they are being refer to ambulatory PT by the case manager,but the patient's wife insisted ,they going to wait until the P.T see them.

## 2020-03-08 NOTE — Progress Notes (Signed)
Physical Therapy Treatment Patient Details Name: Donald Carroll MRN: [id number] DOB: 1965-10-29 Today's Date: 03/08/2020    History of Present Illness Pt is a 54 y/o male admitted after sustaining a witnessed fall when he was at a store. +LOC. EKG normal.  Telemetry with no evidence of arrhythmias.  2D echocardiogram pending.  Skeletal survey including CT head and CT cervical spine were normal with some evidence of hematoma.  No evidence of pulmonary embolism. PMH including but not limited to EtOH and heroin abuse, anxiety, depression, BPD1, PTSD, HTN. Of note, pt with recent hospitalization in which he required mechanical ventilation on 4/13-4/22.    PT Comments    Received call from RN regarding patient's caregiver requesting to be instructed in HEP prior to pt's discharge today. Reviewed chart and called room to discuss recommended follow-up with OPPT. Caregiver indicated they will not qualify for [facility] or OPPT due to his Medicaid status is pending. Assessed patient and caregiver explained current HEP she had come up with for pt. Educated in exercises outlined below. Caregiver and pt very appreciative.    Follow Up Recommendations  Outpatient PT(although pt is Medicaid pending and will not be able )     Equipment Recommendations  None recommended by PT    Recommendations for Other Services       Precautions / Restrictions Precautions Precautions: Fall Restrictions Weight Bearing Restrictions: No    Mobility  Bed Mobility Overal bed mobility: Modified Independent                Transfers Overall transfer level: Needs assistance Equipment used: Rolling walker (2 wheeled) Transfers: Sit to/from Stand Sit to Stand: Min guard         General transfer comment: miguard for safety; vc for safe use of RW  Ambulation/Gait Ambulation/Gait assistance: Min guard;Min assist Gait Distance (Feet): 60 Feet Assistive device: Rolling walker (2 wheeled) Gait Pattern/deviations:  Step-through pattern;Decreased stride length;Narrow base of support Gait velocity: decreased   General Gait Details: pt with very narrow BOS and able to widen stance with cues; slight imbalance with turning to his right; moderate cues for proper/safe use of RW (caregiver reports they did not get instructions when he received the RW)   Stairs             Wheelchair Mobility    Modified Rankin (Stroke Patients Only)       Balance Overall balance assessment: Needs assistance;History of Falls Sitting-balance support: Feet supported Sitting balance-Leahy Scale: Good     Standing balance support: Bilateral upper extremity supported Standing balance-Leahy Scale: Poor Standing balance comment: reliant on external support from bil UEs                            Cognition Arousal/Alertness: Awake/alert Behavior During Therapy: WFL for tasks assessed/performed Overall Cognitive Status: Impaired/Different from baseline Area of Impairment: Memory                     Memory: Decreased short-term memory         General Comments: pt stating he has no recollection of this recent fall and difficulties with memory regarding his previous fall in which he was admitted to the hospital       Exercises General Exercises - Lower Extremity Toe Raises: AAROM;Both Heel Raises: AAROM;Both;Other reps (comment) Other Exercises Other Exercises: Caregiver interested in any suggestions for exercises as he got no HHPT after last admission due  to medicaid pending status; See notes from [employer] re: exercises instructed in and then handout provided.    Exercises Sit to Stand with Newman Pies Between Knees - 1 x daily - 7 x weekly - 2 sets - 10 reps - - hold Heel Toe Raises with Counter Support - 1 x daily - 7 x weekly - 1 sets - 20 reps - 3 seconds hold Forward and Backward Walking with Eyes Closed and Counter Support - 1 x daily - 7 x weekly - 1-2 sets - 10 reps - - hold Standing  Balance in Corner - 1 x daily - 7 x weekly - 1 sets - 10 reps - - hold  Access Code: [email or web address] URL: [email or web address] Date: 03/08/2020 Prepared by: [facility] Acute Rehab  General Comments        Pertinent Vitals/Pain Pain Assessment: Faces Faces Pain Scale: No hurt    Home Living                      Prior Function            PT Goals (current goals can now be found in the care plan section) Acute Rehab PT Goals Patient Stated Goal: to get an HEP to work on his balance because he cannot get OP or HH with Medicaid pending PT Goal Formulation: With patient/family Time For Goal Achievement: 03/21/20 Potential to Achieve Goals: Good Progress towards PT goals: Progressing toward goals    Frequency    Min 3X/week      PT Plan Current plan remains appropriate    Co-evaluation              AM-PAC PT "6 Clicks" Mobility   Outcome Measure  Help needed turning from your back to your side while in a flat bed without using bedrails?: None Help needed moving from lying on your back to sitting on the side of a flat bed without using bedrails?: None Help needed moving to and from a bed to a chair (including a wheelchair)?: A Little Help needed standing up from a chair using your arms (e.g., wheelchair or bedside chair)?: A Little Help needed to walk in hospital room?: A Little Help needed climbing 3-5 steps with a railing? : A Lot 6 Click Score: 19    End of Session Equipment Utilized During Treatment: Gait belt Activity Tolerance: Patient tolerated treatment well Patient left: in bed;with call bell/phone within reach;with family/visitor present Nurse Communication: Other (comment)(given HEP; ok to dc from PT standpoint) PT Visit Diagnosis: Other abnormalities of gait and mobility (R26.89)     Time: 6269-4854 PT Time Calculation (min) (ACUTE ONLY): 11 min  Charges:  $Therapeutic Exercise: 8-22 mins                      Jerolyn Center, PT Pager [phone]    Zena Amos 03/08/2020, 6:42 PM

## 2020-03-08 NOTE — Progress Notes (Signed)
Patient's wife requested to have someone help or standby by ,in the bathroom while the patient is taking his shower.Nurse politely explained to his wife that bedside commode was set-up in the bathroom and he could sit in,while taking his shower.His wife said ,he needs help.Nurse politely said to her that her husband is capable of doing it so.Wife said that,we don't have that bedside commode at home,to which the nurse replied that '' he is homeless and we are not qualified for that, and she added '' how they would discharged him he is" homeless'' Nurse replied that,there is no medical reason for him to stay here in the hospital,that's why the doctor is discharging him,since yesterday.Wife said ''its alright and going to assist him in the bathroom.

## 2020-03-08 NOTE — Progress Notes (Signed)
NEW ADMISSION NOTE New Admission Note:   Arrival Method: E .D. stretcher  Mental Orientation:  Alert ,oriented x4 Telemetry: #13 called and confirmed Assessment: Completed Skin: No skin issue-assessed with Lang Snow R.N. IV: Rt.forearm- SL Pain:Denies Tubes:None  Safety Measures: Safety Fall Prevention Plan has been given, discussed and signed,place in low bed,alarm set on sensitive scale. Admission: Completed [facility] Orientation: Patient has been orientated to the room, unit and staff.  Family:Wife  Orders have been reviewed and implemented. Will continue to monitor the patient. Call light has been placed within reach and bed alarm has been activated.   [place], Kem Kays, RN

## 2020-03-08 NOTE — Plan of Care (Signed)
  Problem: Health Behavior/Discharge Planning: Goal: Ability to manage health-related needs will improve Outcome: Adequate for Discharge   Problem: Clinical Measurements: Goal: Ability to maintain clinical measurements within normal limits will improve Outcome: Adequate for Discharge Goal: Will remain free from infection Outcome: Completed/Met Goal: Diagnostic test results will improve Outcome: Progressing Goal: Respiratory complications will improve Outcome: Completed/Met Goal: Cardiovascular complication will be avoided Outcome: Completed/Met

## 2020-03-08 NOTE — Discharge Summary (Signed)
Physician Discharge Summary  Donald Carroll ZOX:[id number] DOB: 07-14-1966 DOA: 03/06/2020  PCP: Lavinia Sharps, NP  Admit date: 03/06/2020 Discharge date: 03/08/2020  Admitted From: Home Disposition: Home  Recommendations for Outpatient Follow-up:  1. Follow up with PCP in 1-2 weeks 2. All-time orthostatic precautions.  Discharge Condition: Stable CODE STATUS: Full code Diet recommendation: Regular diet  Discharge summary: 54 year old gentleman with history of alcohol and heroin abuse, recent ICU admission for severe sepsis, hypotension and altered mental status status post mechanical ventilation, discharged home with improvement.  He has been using a walker at home since that time and has stopped drinking alcohol or doing drugs.  Patient does occasionally get dizziness while walking.  Patient was shopping at [facility], felt dizzy after standing for some time, blacked out and fell on his back of the head.  Witnessed by his significant other, unconscious for about 1 to 2 minutes, slightly confused afterwards.  No witnessed seizure.  Syncope: Suspect vasovagal, postural or orthostatic syncope.  Patient with recent complicated hospitalization and physical deconditioning. EKG normal.  Telemetry with no evidence of arrhythmias.  2D echocardiogram normal.  Skeletal survey including CT head and CT cervical spine were normal with some evidence of hematoma.  No evidence of pulmonary embolism on CT scan of the chest. He was monitored in the hospital.  No evidence of any arrhythmia on telemetry.  No recurrence.  Negative orthostatic. Patient was extensively educated, orthostatic precautions discussed.  Currently no indication for further investigation.  Advised patient to be look out for any recurrence of symptoms. Also advised not to drive until medical follow-up.  Discharge Diagnoses:  Principal Problem:   Syncope Active Problems:   PTSD (post-traumatic stress disorder)   Pulmonary  nodule   History of ETOH abuse   History of heroin abuse (HCC)   Critical illness neuropathy [facility])    Discharge Instructions  Discharge Instructions    Call MD for:  persistant dizziness or light-headedness   Complete by: As directed    Diet - low sodium heart healthy   Complete by: As directed    Increase activity slowly   Complete by: As directed      Allergies as of 03/08/2020   No Known Allergies     Medication List    STOP taking these medications   cloNIDine 0.1 MG tablet Commonly known as: CATAPRES     TAKE these medications   DULoxetine 60 MG capsule Commonly known as: CYMBALTA Take 60 mg by mouth daily.   gabapentin 400 MG capsule Commonly known as: NEURONTIN Take 400 mg by mouth 3 (three) times daily.   QUEtiapine 100 MG tablet Commonly known as: SEROQUEL Take 1 tablet (100 mg total) by mouth at bedtime.       No Known Allergies   Procedures/Studies: CT Head Wo Contrast  Result Date: 03/06/2020 CLINICAL DATA:  Syncopal episode. Fall. Trauma to back of head. EXAM: CT HEAD WITHOUT CONTRAST CT CERVICAL SPINE WITHOUT CONTRAST TECHNIQUE: Multidetector CT imaging of the head and cervical spine was performed following the standard protocol without intravenous contrast. Multiplanar CT image reconstructions of the cervical spine were also generated. COMPARISON:  MR head without contrast 02/07/2020. FINDINGS: CT HEAD FINDINGS Brain: No acute infarct, hemorrhage, or mass lesion is present. No significant white matter lesions are present. The ventricles are of normal size. No significant extraaxial fluid collection is present. The brainstem and cerebellum are within normal limits. Vascular: No hyperdense vessel or unexpected calcification. Skull: Calvarium is intact.  No focal lytic or blastic lesions are present. Left parietal occipital scalp hematoma is present without underlying fracture. No foreign body is present. Sinuses/Orbits: The paranasal sinuses and mastoid  air cells are clear. The globes and orbits are within normal limits. CT CERVICAL SPINE FINDINGS Alignment: No significant listhesis is present. There is some straightening of the normal cervical lordosis. Skull base and vertebrae: Craniocervical junction is normal. Vertebral body heights are maintained. Endplate degenerative changes are present at C4-5 left greater than right. Vertebral body heights are maintained. No acute or healing fractures are present. Soft tissues and spinal canal: No prevertebral fluid or swelling. No visible canal hematoma. Disc levels: Uncovertebral spurring leads to moderate foraminal stenosis bilaterally at C4-5 right greater than left. Degenerative changes are present at C3-4 and C5-6 without significant stenosis at these levels. Upper chest: The lung apices are clear. Thoracic inlet is within normal. IMPRESSION: 1. Left parietal occipital scalp hematoma without underlying fracture. 2. Normal CT appearance of the brain. 3. Multilevel degenerative changes of the cervical spine as described. Degenerative changes are greatest at C4-5. 4. No acute or healing fractures of the cervical spine. Electronically Signed   By: Marin Robertshristopher  Mattern M.D.   On: 03/06/2020 17:35   CT Angio Chest PE W and/or Wo Contrast  Result Date: 03/06/2020 CLINICAL DATA:  Shortness of breath and syncopal episode EXAM: CT ANGIOGRAPHY CHEST WITH CONTRAST TECHNIQUE: Multidetector CT imaging of the chest was performed using the standard protocol during bolus administration of intravenous contrast. Multiplanar CT image reconstructions and MIPs were obtained to evaluate the vascular anatomy. CONTRAST:  80mL OMNIPAQUE IOHEXOL 350 MG/ML SOLN COMPARISON:  None. FINDINGS: Cardiovascular: There is a optimal opacification of the pulmonary arteries. There is no central,segmental, or subsegmental filling defects within the pulmonary arteries. The heart is normal in size. No pericardial effusion or thickening. No evidence right  heart strain. There is normal three-vessel brachiocephalic anatomy without proximal stenosis. The thoracic aorta is normal in appearance. Mediastinum/Nodes: No hilar, mediastinal, or axillary adenopathy. There are however partially calcified lymph nodes within the anterior pretracheal and right hilar space. Thyroid gland, trachea, and esophagus demonstrate no significant findings. Lungs/Pleura: A small ill-defined nodular opacity seen at the posterior right lung base measuring 1.4 cm, series 7, image 75. There is minimal bilateral ground-glass opacities. Tiny subpleural bleb formation seen at the periphery of both upper lungs. There is a calcified nodule seen within the right upper lung. No pleural effusion or large airspace consolidation. Upper Abdomen: Calcifications seen within the spleen. Musculoskeletal: No chest wall abnormality. No acute or significant osseous findings. Review of the MIP images confirms the above findings. IMPRESSION: 1. No central, segmental, or subsegmental pulmonary embolism. 2. Findings suggestive of prior granulomatous disease. 3. 1.4 cm subpleural right lower lung nodule is seen. Follow-up non-contrast CT recommended at 3-6 months to confirm persistence. If unchanged, and solid component remains <6 mm, annual CT is recommended until 5 years of stability has been established. If persistent these nodules should be considered highly suspicious if the solid component of the nodule is 6 mm or greater in size and enlarging. This recommendation follows the consensus statement: Guidelines for Management of Incidental Pulmonary Nodules Detected on CT Images: From the Fleischner Society 2017; Radiology 2017; [id number]:[id number]. 4. Electronically Signed   By: Jonna ClarkBindu  Avutu M.D.   On: 03/06/2020 22:15   CT Cervical Spine Wo Contrast  Result Date: 03/06/2020 CLINICAL DATA:  Syncopal episode. Fall. Trauma to back of head. EXAM: CT HEAD WITHOUT CONTRAST  CT CERVICAL SPINE WITHOUT CONTRAST TECHNIQUE:  Multidetector CT imaging of the head and cervical spine was performed following the standard protocol without intravenous contrast. Multiplanar CT image reconstructions of the cervical spine were also generated. COMPARISON:  MR head without contrast 02/07/2020. FINDINGS: CT HEAD FINDINGS Brain: No acute infarct, hemorrhage, or mass lesion is present. No significant white matter lesions are present. The ventricles are of normal size. No significant extraaxial fluid collection is present. The brainstem and cerebellum are within normal limits. Vascular: No hyperdense vessel or unexpected calcification. Skull: Calvarium is intact. No focal lytic or blastic lesions are present. Left parietal occipital scalp hematoma is present without underlying fracture. No foreign body is present. Sinuses/Orbits: The paranasal sinuses and mastoid air cells are clear. The globes and orbits are within normal limits. CT CERVICAL SPINE FINDINGS Alignment: No significant listhesis is present. There is some straightening of the normal cervical lordosis. Skull base and vertebrae: Craniocervical junction is normal. Vertebral body heights are maintained. Endplate degenerative changes are present at C4-5 left greater than right. Vertebral body heights are maintained. No acute or healing fractures are present. Soft tissues and spinal canal: No prevertebral fluid or swelling. No visible canal hematoma. Disc levels: Uncovertebral spurring leads to moderate foraminal stenosis bilaterally at C4-5 right greater than left. Degenerative changes are present at C3-4 and C5-6 without significant stenosis at these levels. Upper chest: The lung apices are clear. Thoracic inlet is within normal. IMPRESSION: 1. Left parietal occipital scalp hematoma without underlying fracture. 2. Normal CT appearance of the brain. 3. Multilevel degenerative changes of the cervical spine as described. Degenerative changes are greatest at C4-5. 4. No acute or healing fractures  of the cervical spine. Electronically Signed   By: Marin Roberts M.D.   On: 03/06/2020 17:35   MR BRAIN WO CONTRAST  Result Date: 02/07/2020 CLINICAL DATA:  Encephalopathy. EXAM: MRI HEAD WITHOUT CONTRAST TECHNIQUE: Multiplanar, multiecho pulse sequences of the brain and surrounding structures were obtained without intravenous contrast. COMPARISON:  Head CT 02/04/2020 FINDINGS: Brain: There is no evidence of acute infarct. No evidence of intracranial mass. No midline shift or extra-axial fluid collection. No chronic intracranial blood products. No focal parenchymal signal abnormality is identified. Cerebral volume is normal for age. Vascular: Flow voids maintained within the proximal large arterial vessels. Non dominant intracranial right vertebral artery. Skull and upper cervical spine: No focal marrow lesion. Sinuses/Orbits: Visualized orbits demonstrate no acute abnormality. Mild ethmoid, maxillary and sphenoid sinus mucosal thickening. Air-fluid levels within the bilateral sphenoid and maxillary sinuses. Secretions within the posterior nasopharynx. Bilateral mastoid effusions. IMPRESSION: Unremarkable non-contrast MRI appearance of the brain. No evidence of acute intracranial abnormality. Paranasal sinus mucosal thickening with bilateral maxillary and sphenoid sinus air-fluid levels. Correlate for acute sinusitis. Bilateral mastoid effusions. Electronically Signed   By: Jackey Loge DO   On: 02/07/2020 18:40   DG Chest Portable 1 View  Result Date: 03/06/2020 CLINICAL DATA:  Shortness of breath, fall, loss of consciousness EXAM: PORTABLE CHEST 1 VIEW COMPARISON:  Radiograph 02/08/2020 FINDINGS: There are chronically coarsened interstitial changes towards the lung bases. More patchy opacity is seen in the periphery of the right lung base though some increase in the attenuation of the lung periphery is likely related to body habitus. Calcified granuloma in the right upper lung is stable from priors.  No visible pneumothorax or effusion. Cardiomediastinal contours are similar to prior. No acute osseous injury is seen. Remote posttraumatic deformity of the distal left clavicle with plate and screw  fixation and left rotator cuff repair. Telemetry leads overlie the chest. IMPRESSION: 1. Patchy opacity in the right lung base periphery. Nonspecific but could reflect infection or inflammation versus contusive change in the setting of fall. Appears distinct from the more generalized increased peripheral attenuation secondary to body habitus. 2. Chronically coarsened interstitial changes. 3. No other acute cardiopulmonary or traumatic findings in the chest. 4. Stable calcified granuloma in the right upper lung. Electronically Signed   By: Kreg Shropshire M.D.   On: 03/06/2020 18:17   DG CHEST PORT 1 VIEW  Result Date: 02/10/2020 CLINICAL DATA:  Respiratory failure.  Intubation. EXAM: PORTABLE CHEST 1 VIEW COMPARISON:  02/08/2020 FINDINGS: Endotracheal tube terminates 4.3 cm above carina. Nasogastric tube extends beyond the inferior aspect of the film. Left internal jugular line tip at low SVC. Borderline cardiomegaly. Probable small left pleural effusion. No pneumothorax. Interstitial prominence is similar. Left greater than right base airspace disease is not significantly changed. IMPRESSION: No significant change since the prior exam. Interstitial prominence, pulmonary edema versus atypical infection. Left greater than right base airspace disease, most likely atelectasis. Electronically Signed   By: Jeronimo Greaves M.D.   On: 02/10/2020 07:40   DG Chest Port 1 View  Result Date: 02/08/2020 CLINICAL DATA:  Acute respiratory failure. EXAM: PORTABLE CHEST 1 VIEW COMPARISON:  02/06/2020 FINDINGS: Endotracheal tube terminates 3.7 cm above carina. Nasogastric tube extends beyond the inferior aspect of the film. Left internal jugular line tip at mid SVC. Remote left clavicular fixation. Normal heart size. Possible small  left pleural effusion. No pneumothorax. Interstitial prominence and indistinctness is similar. Right upper lobe calcified granuloma. Developing left greater than right base airspace disease. IMPRESSION: Similar interstitial thickening, pulmonary edema versus atypical infection. More focal airspace disease in the lung bases, worse on the left and new. Atelectasis versus (especially on the left) developing infection. Electronically Signed   By: Jeronimo Greaves M.D.   On: 02/08/2020 08:51   Overnight EEG with video  Result Date: 02/10/2020 Charlsie Quest, MD     02/10/2020  2:35 PM Patient Name: Donald Carroll MRN: [id number] Epilepsy Attending: Charlsie Quest Referring Physician/Provider: Dr. Onalee Hua Duration: 02/09/2020 1524 to 02/10/2020 1139  Patient history: 54 year old male with altered mental status.  EEG evaluate for seizures.  Level of alertness: lethargic  AEDs during EEG study:  Oxcarbazepine, Versed  Technical aspects: This EEG study was done with scalp electrodes positioned according to the 10-20 International system of electrode placement. Electrical activity was acquired at a sampling rate of  and reviewed with a high frequency filter of  and a low frequency filter of . EEG data were recorded continuously and digitally stored.  Description: During awake state, no clear posterior dominant rhythm was seen. EEG showed continuous generalized 3 to 5 Hz theta-delta slowing. Triphasic waves, generalized were also noted at times. Hyperventilation and photic stimulation were not performed.  Abnormality - Triphasic waves, generalized - Continuous slow, generalized  IMPRESSION: This study is suggestive of moderate diffuse encephalopathy, nonspecific to etiology but could be secondary to toxic-metabolic causes. No seizures or epileptiform discharges were seen throughout the recording. Charlsie Quest   ECHOCARDIOGRAM COMPLETE  Result Date: 03/07/2020    ECHOCARDIOGRAM REPORT    Patient Name:   Donald Carroll Date of Exam: 03/07/2020 Medical Rec #:  [id number]        Height:       73.0 in Accession #:    [id number]  Weight:       207.5 lb Date of Birth:  [birth year]        BSA:          2.185 m Patient Age:    10 years         BP:           122/92 mmHg Patient Gender: M                HR:           72 bpm. Exam Location:  Inpatient Procedure: 2D Echo, Color Doppler and Cardiac Doppler Indications:    R55 Syncope  History:        Patient has no prior history of Echocardiogram examinations.  Sonographer:    Merrie Roof RDCS Referring Phys: [place]  1. Left ventricular ejection fraction, by estimation, is 50 to 55%. The left ventricle has low normal function. The left ventricle has no regional wall motion abnormalities. Left ventricular diastolic parameters are indeterminate.  2. Right ventricular systolic function is normal. The right ventricular size is normal. Tricuspid regurgitation signal is inadequate for assessing PA pressure.  3. The mitral valve is grossly normal. Trivial mitral valve regurgitation.  4. The aortic valve is tricuspid. Aortic valve regurgitation is not visualized.  5. The inferior vena cava is normal in size with greater than 50% respiratory variability, suggesting right atrial pressure of 3 mmHg. FINDINGS  Left Ventricle: Left ventricular ejection fraction, by estimation, is 50 to 55%. The left ventricle has low normal function. The left ventricle has no regional wall motion abnormalities. The left ventricular internal cavity size was normal in size. There is borderline left ventricular hypertrophy. Left ventricular diastolic parameters are indeterminate. Right Ventricle: The right ventricular size is normal. No increase in right ventricular wall thickness. Right ventricular systolic function is normal. Tricuspid regurgitation signal is inadequate for assessing PA pressure. Left Atrium: Left atrial size was normal in size. Right Atrium:  Right atrial size was normal in size. Pericardium: There is no evidence of pericardial effusion. Mitral Valve: The mitral valve is grossly normal. Trivial mitral valve regurgitation. Tricuspid Valve: The tricuspid valve is grossly normal. Tricuspid valve regurgitation is trivial. Aortic Valve: The aortic valve is tricuspid. Aortic valve regurgitation is not visualized. Pulmonic Valve: The pulmonic valve was grossly normal. Pulmonic valve regurgitation is not visualized. Aorta: The aortic root is normal in size and structure. Venous: The inferior vena cava is normal in size with greater than 50% respiratory variability, suggesting right atrial pressure of 3 mmHg. IAS/Shunts: No atrial level shunt detected by color flow Doppler.   LV Volumes (MOD) LV vol d, MOD A4C: 78.6 ml Diastology LV vol s, MOD A4C: 35.9 ml LV e' lateral:   8.70 cm/s LV SV MOD A4C:     78.6 ml LV E/e' lateral: 7.5                            LV e' medial:    6.42 cm/s                            LV E/e' medial:  10.2  RIGHT VENTRICLE RV Basal diam:  3.10 cm RV S prime:     9.90 cm/s TAPSE (M-mode): 2.0 cm LEFT ATRIUM             Index  RIGHT ATRIUM           Index LA Vol (A2C):   50.0 ml 22.88 ml/m RA Area:     10.70 cm LA Vol (A4C):   29.2 ml 13.36 ml/m RA Volume:   21.40 ml  9.79 ml/m LA Biplane Vol: 39.8 ml 18.21 ml/m  AORTIC VALVE LVOT Vmax:   108.00 cm/s LVOT Vmean:  64.600 cm/s LVOT VTI:    0.195 m MITRAL VALVE MV Area (PHT): 3.08 cm    SHUNTS MV Decel Time: 246 msec    Systemic VTI: 0.20 m MV E velocity: 65.60 cm/s MV A velocity: 72.80 cm/s MV E/A ratio:  0.90 Nona Dell MD Electronically signed by Nona Dell MD Signature Date/Time: 03/07/2020/12:30:55 PM    Final     Subjective: Patient seen and examined.  No overnight events.  He ambulated around the hallway with no dizziness or fall.  Orthostatic no significant drop in systolic blood pressure.   Discharge Exam: Vitals:   03/08/20 0607 03/08/20 0946  BP: 123/78  133/84  Pulse: 66 67  Resp: 18 18  Temp: 98 F (36.7 C) 97.9 F (36.6 C)  SpO2: 98% 97%   Vitals:   03/07/20 1700 03/07/20 2046 03/08/20 0607 03/08/20 0946  BP: 116/76 132/81 123/78 133/84  Pulse: 86 77 66 67  Resp: 16 17 18 18   Temp: 98 F (36.7 C) 97.7 F (36.5 C) 98 F (36.7 C) 97.9 F (36.6 C)  TempSrc: Oral Oral  Oral  SpO2: 98% 96% 98% 97%  Weight: 95.4 kg 95.4 kg    Height: 6\' 1"  (1.854 m)       General: Pt is alert, awake, not in acute distress Cardiovascular: RRR, S1/S2 +, no rubs, no gallops Respiratory: CTA bilaterally, no wheezing, no rhonchi Abdominal: Soft, NT, ND, bowel sounds + Extremities: no edema, no cyanosis    The results of significant diagnostics from this hospitalization (including imaging, microbiology, ancillary and laboratory) are listed below for reference.     Microbiology: Recent Results (from the past 240 hour(s))  SARS Coronavirus 2 by RT PCR (hospital order, performed in [facility] hospital lab) Nasopharyngeal Nasopharyngeal Swab     Status: None   Collection Time: 03/06/20  5:16 PM   Specimen: Nasopharyngeal Swab  Result Value Ref Range Status   SARS Coronavirus 2 NEGATIVE NEGATIVE Final    Comment: (NOTE) SARS-CoV-2 target nucleic acids are NOT DETECTED. The SARS-CoV-2 RNA is generally detectable in upper and lower respiratory specimens during the acute phase of infection. The lowest concentration of SARS-CoV-2 viral copies this assay can detect is 250 copies / mL. A negative result does not preclude SARS-CoV-2 infection and should not be used as the sole basis for treatment or other patient management decisions.  A negative result may occur with improper specimen collection / handling, submission of specimen other than nasopharyngeal swab, presence of viral mutation(s) within the areas targeted by this assay, and inadequate number of viral copies (<250 copies / mL). A negative result must be combined with  clinical observations, patient history, and epidemiological information. Fact Sheet for Patients:   [facility] Fact Sheet for Healthcare Providers: 03/08/20 This test is not yet approved or cleared  by the [employer] FDA and has been authorized for detection and/or diagnosis of SARS-CoV-2 by FDA under an Emergency Use Authorization (EUA).  This EUA will remain in effect (meaning this test can be used) for the duration of the COVID-19 declaration under Section 564(b)(1) of the Act,  21 U.S.C. section 360bbb-3(b)(1), unless the authorization is terminated or revoked sooner. Performed at [facility], [address], [place], [place] [address]      Labs: BNP (last 3 results) Recent Labs    02/04/20 2156 03/06/20 1548  BNP 252.7* 58.8   Basic Metabolic Panel: Recent Labs  Lab 03/06/20 1611 03/06/20 1620 03/07/20 0500  NA 137 139 138  K 4.2 4.2 4.2  CL 101 102 105  CO2 25  --  24  GLUCOSE 87 82 90  BUN 14 14 9   CREATININE 1.04 0.90 1.01  CALCIUM 9.9  --  9.3  MG 2.1  --   --    Liver Function Tests: Recent Labs  Lab 03/06/20 1611  AST 27  ALT 34  ALKPHOS 84  BILITOT 0.7  PROT 7.6  ALBUMIN 4.0   No results for input(s): LIPASE, AMYLASE in the last 168 hours. No results for input(s): AMMONIA in the last 168 hours. CBC: Recent Labs  Lab 03/06/20 1611 03/06/20 1620 03/07/20 0500  WBC 13.0*  --  8.4  NEUTROABS 10.6*  --   --   HGB 15.2 15.6 14.1  HCT 45.7 46.0 43.5  MCV 95.8  --  96.5  PLT 244  --  213   Cardiac Enzymes: No results for input(s): CKTOTAL, CKMB, CKMBINDEX, TROPONINI in the last 168 hours. BNP: Invalid input(s): POCBNP CBG: Recent Labs  Lab 03/06/20 1705 03/07/20 0547 03/08/20 0607  GLUCAP 88 90 88   D-Dimer No results for input(s): DDIMER in the last 72 hours. Hgb A1c No results for input(s): HGBA1C in the last 72 hours. Lipid Profile No results for  input(s): CHOL, HDL, LDLCALC, TRIG, CHOLHDL, LDLDIRECT in the last 72 hours. Thyroid function studies No results for input(s): TSH, T4TOTAL, T3FREE, THYROIDAB in the last 72 hours.  Invalid input(s): FREET3 Anemia work up No results for input(s): VITAMINB12, FOLATE, FERRITIN, TIBC, IRON, RETICCTPCT in the last 72 hours. Urinalysis    Component Value Date/Time   COLORURINE AMBER (A) 02/04/2020 1759   APPEARANCEUR HAZY (A) 02/04/2020 1759   LABSPEC 1.017 02/04/2020 1759   PHURINE 5.0 02/04/2020 1759   GLUCOSEU NEGATIVE 02/04/2020 1759   HGBUR SMALL (A) 02/04/2020 1759   BILIRUBINUR NEGATIVE 02/04/2020 1759   KETONESUR NEGATIVE 02/04/2020 1759   PROTEINUR 30 (A) 02/04/2020 1759   NITRITE NEGATIVE 02/04/2020 1759   LEUKOCYTESUR NEGATIVE 02/04/2020 1759   Sepsis Labs Invalid input(s): PROCALCITONIN,  WBC,  LACTICIDVEN Microbiology Recent Results (from the past 240 hour(s))  SARS Coronavirus 2 by RT PCR (hospital order, performed in [facility] hospital lab) Nasopharyngeal Nasopharyngeal Swab     Status: None   Collection Time: 03/06/20  5:16 PM   Specimen: Nasopharyngeal Swab  Result Value Ref Range Status   SARS Coronavirus 2 NEGATIVE NEGATIVE Final    Comment: (NOTE) SARS-CoV-2 target nucleic acids are NOT DETECTED. The SARS-CoV-2 RNA is generally detectable in upper and lower respiratory specimens during the acute phase of infection. The lowest concentration of SARS-CoV-2 viral copies this assay can detect is 250 copies / mL. A negative result does not preclude SARS-CoV-2 infection and should not be used as the sole basis for treatment or other patient management decisions.  A negative result may occur with improper specimen collection / handling, submission of specimen other than nasopharyngeal swab, presence of viral mutation(s) within the areas targeted by this assay, and inadequate number of viral copies (<250 copies / mL). A negative result must be combined  with  clinical observations, patient history, and epidemiological information. Fact Sheet for Patients:   BoilerBrush.com.cy Fact Sheet for Healthcare Providers: https://pope.com/ This test is not yet approved or cleared  by the [place] FDA and has been authorized for detection and/or diagnosis of SARS-CoV-2 by FDA under an Emergency Use Authorization (EUA).  This EUA will remain in effect (meaning this test can be used) for the duration of the COVID-19 declaration under Section 564(b)(1) of the Act, 21 U.S.C. section 360bbb-3(b)(1), unless the authorization is terminated or revoked sooner. Performed at [facility], [address], [place], [place] [address]      Time coordinating discharge: 25 minutes  SIGNED:   Dorcas Carrow, MD  [facility] 03/08/2020, 12:01 PM

## 2020-03-18 ENCOUNTER — Ambulatory Visit (INDEPENDENT_AMBULATORY_CARE_PROVIDER_SITE_OTHER): Payer: Self-pay | Admitting: Nurse Practitioner

## 2020-03-18 ENCOUNTER — Other Ambulatory Visit: Payer: Self-pay

## 2020-03-18 ENCOUNTER — Encounter: Payer: Self-pay | Admitting: Nurse Practitioner

## 2020-03-18 VITALS — BP 126/89 | HR 89 | Temp 97.6°F | Resp 20 | Ht 71.0 in | Wt 195.6 lb

## 2020-03-18 DIAGNOSIS — F315 Bipolar disorder, current episode depressed, severe, with psychotic features: Secondary | ICD-10-CM

## 2020-03-18 DIAGNOSIS — Z0000 Encounter for general adult medical examination without abnormal findings: Secondary | ICD-10-CM

## 2020-03-18 DIAGNOSIS — F1011 Alcohol abuse, in remission: Secondary | ICD-10-CM

## 2020-03-18 DIAGNOSIS — F4310 Post-traumatic stress disorder, unspecified: Secondary | ICD-10-CM

## 2020-03-18 DIAGNOSIS — F1111 Opioid abuse, in remission: Secondary | ICD-10-CM

## 2020-03-18 DIAGNOSIS — Z23 Encounter for immunization: Secondary | ICD-10-CM

## 2020-03-18 MED ORDER — GABAPENTIN 400 MG PO CAPS: 400.0000 mg | ORAL_CAPSULE | Freq: Three times a day (TID) | ORAL | 2 refills | Status: AC

## 2020-03-18 MED ORDER — QUETIAPINE FUMARATE 100 MG PO TABS: 100.0000 mg | ORAL_TABLET | Freq: Every day | ORAL | 2 refills | Status: AC

## 2020-03-18 MED ORDER — DULOXETINE HCL 60 MG PO CPEP: 60.0000 mg | ORAL_CAPSULE | Freq: Every day | ORAL | 2 refills | Status: AC

## 2020-03-18 MED ORDER — TETANUS-DIPHTHERIA TOXOIDS TD 5-2 LFU IM INJ: 0.5000 mL | INJECTION | Freq: Once | INTRAMUSCULAR | Status: DC

## 2020-03-18 NOTE — Progress Notes (Signed)
[facility] Patient [facility] [address] [place], [place]  [address] Phone:  [phone]   Fax:  [phone]   New Patient Office Visit  Subjective:  Patient ID: Donald Carroll, male    DOB: 14-Dec-1965  Age: 54 y.o. MRN: [id number]  CC: Establish Care.   HPI Donald Carroll presents to establish care. He  has a past medical history of Anxiety, Depression, and Hypertension.  Pt admitted recently from 4/13-4/22 critically ill during that admit with severe sepsis with hypotension, AMS, AKI with creat 6, ended up being due to a bilateral PNA (COVID negative though) that landed him on a vent in the ICU for 5 days.  Extubated 4/19. Pt with generalized weakness, using a walker since time of discharge.  He was reevaluated in the ER on 5/14/21for a syncope and fall, hit back of head.  Onset was sudden and without warning.   He admits that he is doing well today. He is living with a friend who assist him. He has gained strengthen. He has changed his lifestyle no more ETOH or tobacco use. Marland Kitchen He is eating better.  [facility] is [facility] for his Bipolar Disorder. He denies any side effective of any of his medications.   Past Medical History:  Diagnosis Date  . Anxiety   . Depression   . Hypertension     History reviewed. No pertinent surgical history.  Family History  Problem Relation Age of Onset  . Hypertension Mother   . Hypertension Father     Social History   Socioeconomic History  . Marital status: Single    Spouse name: Not on file  . Number of children: Not on file  . Years of education: Not on file  . Highest education level: Not on file  Occupational History  . Not on file  Tobacco Use  . Smoking status: Former Smoker    Packs/day: 1.00    Types: Cigarettes    Quit date: 03/09/2020    Years since quitting: 0.0  . Smokeless tobacco: Never Used  Substance and Sexual Activity  . Alcohol use: Not Currently    Comment:  stopped drinking 6-7 monhs ago  . Drug use: Not Currently    Comment: stopped about 6-7 months ago  . Sexual activity: Not Currently  Other Topics Concern  . Not on file  Social History Narrative  . Not on file   Social Determinants of Health   Financial Resource Strain:   . Difficulty of Paying Living Expenses:   Food Insecurity:   . Worried About Programme researcher, broadcasting/film/video in the Last Year:   . Barista in the Last Year:   Transportation Needs:   . Freight forwarder (Medical):   Marland Kitchen Lack of Transportation (Non-Medical):   Physical Activity:   . Days of Exercise per Week:   . Minutes of Exercise per Session:   Stress:   . Feeling of Stress :   Social Connections:   . Frequency of Communication with Friends and Family:   . Frequency of Social Gatherings with Friends and Family:   . Attends Religious Services:   . Active Member of Clubs or Organizations:   . Attends Banker Meetings:   Marland Kitchen Marital Status:   Intimate Partner Violence:   . Fear of Current or Ex-Partner:   . Emotionally Abused:   Marland Kitchen Physically Abused:   . Sexually Abused:  ROS Review of Systems  All other systems reviewed and are negative.   Objective:   Today's Vitals: BP 126/89 (BP Location: Left Arm, Patient Position: Sitting, Cuff Size: Normal)   Pulse 89   Temp 97.6 F (36.4 C) (Oral)   Resp 20   Ht 5\' 11"  (1.803 m)   Wt 195 lb 9.6 oz (88.7 kg)   SpO2 99%   BMI 27.28 kg/m   Physical Exam Constitutional:      General: He is not in acute distress.    Appearance: Normal appearance. He is normal weight. He is not ill-appearing, toxic-appearing or diaphoretic.  Cardiovascular:     Rate and Rhythm: Normal rate and regular rhythm.     Pulses: Normal pulses.     Heart sounds: Normal heart sounds.  Pulmonary:     Effort: Pulmonary effort is normal.     Breath sounds: Normal breath sounds.  Abdominal:     General: Abdomen is flat. Bowel sounds are normal.     Palpations:  Abdomen is soft.  Musculoskeletal:     Cervical back: Normal range of motion.     Comments: Walker in use moving  Skin:    General: Skin is warm and dry.     Comments: Multiple tattoos  Neurological:     General: No focal deficit present.     Mental Status: He is alert and oriented to person, place, and time.  Psychiatric:        Mood and Affect: Mood normal.        Behavior: Behavior normal.        Thought Content: Thought content normal.        Judgment: Judgment normal.     Assessment & Plan:   Problem List Items Addressed This Visit      Unprioritized   Bipolar I disorder, current or most recent episode depressed, with psychotic features ([place]) - Primary   Relevant Medications   DULoxetine (CYMBALTA) 60 MG capsule   gabapentin (NEURONTIN) 400 MG capsule   QUEtiapine (SEROQUEL) 100 MG tablet   History of ETOH abuse (Chronic)   History of heroin abuse (HCC) (Chronic)   PTSD (post-traumatic stress disorder) (Chronic)   Relevant Medications   DULoxetine (CYMBALTA) 60 MG capsule    Other Visit Diagnoses    Healthcare maintenance       Relevant Orders   PSA (Completed)   Lipid panel (Completed)   Comp. Metabolic Panel (12) (Completed)      Outpatient Encounter Medications as of 03/18/2020  Medication Sig  . DULoxetine (CYMBALTA) 60 MG capsule Take 1 capsule (60 mg total) by mouth daily.  Marland Kitchen gabapentin (NEURONTIN) 400 MG capsule Take 1 capsule (400 mg total) by mouth 3 (three) times daily.  . QUEtiapine (SEROQUEL) 100 MG tablet Take 1 tablet (100 mg total) by mouth at bedtime.  . [DISCONTINUED] DULoxetine (CYMBALTA) 60 MG capsule Take 60 mg by mouth daily.  . [DISCONTINUED] gabapentin (NEURONTIN) 400 MG capsule Take 400 mg by mouth 3 (three) times daily.  . [DISCONTINUED] QUEtiapine (SEROQUEL) 100 MG tablet Take 1 tablet (100 mg total) by mouth at bedtime.  . [DISCONTINUED] tetanus & diphtheria toxoids (adult) (TENIVAC) injection 0.5 mL    No facility-administered  encounter medications on file as of 03/18/2020.    Follow-up: Return in about 3 months (around 06/18/2020).   Vevelyn Francois, NP

## 2020-03-18 NOTE — Patient Instructions (Signed)
Preventing Cerebrovascular Disease  Arteries are blood vessels that carry blood that contains oxygen from the heart to all parts of the body. Cerebrovascular disease affects arteries that supply the brain. Any condition that blocks or disrupts blood flow to the brain can cause cerebrovascular disease. Brain cells that lose blood supply start to die within minutes (stroke). Stroke is the main danger of cerebrovascular disease. Atherosclerosis and high blood pressure are common causes of cerebrovascular disease. Atherosclerosis is narrowing and hardening of an artery that results when fat, cholesterol, calcium, or other substances (plaque) build up inside an artery. Plaque reduces blood flow through the artery. High blood pressure increases the risk of bleeding inside the brain. Making diet and lifestyle changes to prevent atherosclerosis and high blood pressure lowers your risk of cerebrovascular disease. What nutrition changes can be made?  Eat more fruits, vegetables, and whole grains.  Reduce how much saturated fat you eat. To do this, eat less red meat and fewer full-fat dairy products.  Eat healthy proteins instead of red meat. Healthy proteins include: ? Fish. Eat fish that contains heart-healthy omega-3 fatty acids, twice a week. Examples include salmon, albacore tuna, mackerel, and herring. ? Chicken. ? Nuts. ? Low-fat or nonfat yogurt.  Avoid processed meats, like bacon and lunchmeat.  Avoid foods that contain: ? A lot of sugar, such as sweets and drinks with added sugar. ? A lot of salt (sodium). Avoid adding extra salt to your food, as told by your health care provider. ? Trans fats, such as margarine and baked goods. Trans fats may be listed as "partially hydrogenated oils" on food labels.  Check food labels to see how much sodium, sugar, and trans fats are in foods.  Use vegetable oils that contain low amounts of saturated fat, such as olive oil or canola oil. What lifestyle  changes can be made?  Drink alcohol in moderation. This means no more than 1 drink a day for nonpregnant women and 2 drinks a day for men. One drink equals 12 oz of beer, 5 oz of wine, or 1 oz of hard liquor.  If you are overweight, ask your health care provider to recommend a weight-loss plan for you. Losing 5-10 lb (2.2-4.5 kg) can reduce your risk of diabetes, atherosclerosis, and high blood pressure.  Exercise for 30?60 minutes on most days, or as much as told by your health care provider. ? Do moderate-intensity exercise, such as brisk walking, bicycling, and water aerobics. Ask your health care provider which activities are safe for you.   Do not use any products that contain nicotine or tobacco, such as cigarettes and e-cigarettes. If you need help quitting, ask your health care provider. Why are these changes important? Making these changes lowers your risk of many diseases that can cause cerebrovascular disease and stroke. Stroke is a leading cause of death and disability. Making these changes also improves your overall health and quality of life. What can I do to lower my risk? The following factors make you more likely to develop cerebrovascular disease:  Being overweight.  Smoking.  Being physically inactive.  Eating a high-fat diet.  Having certain health conditions, such as: ? Diabetes. ? High blood pressure. ? Heart disease. ? Atherosclerosis. ? High cholesterol. ? Sickle cell disease.  Talk with your health care provider about your risk for cerebrovascular disease. Work with your health care provider to control diseases that you have that may contribute to cerebrovascular disease. Your health care provider may prescribe medicines to  help prevent major causes of cerebrovascular disease. Where to find more information Learn more about preventing cerebrovascular disease from:  [place], Lung, and [place]:  MoAnalyst.de  Centers for Disease Control and Prevention: http://www.curry-wood.biz/ Summary  Cerebrovascular disease can lead to a stroke.  Atherosclerosis and high blood pressure are major causes of cerebrovascular disease.  Making diet and lifestyle changes can reduce your risk of cerebrovascular disease.  Work with your health care provider to get your risk factors under control to reduce your risk of cerebrovascular disease. This information is not intended to replace advice given to you by your health care provider. Make sure you discuss any questions you have with your health care provider. Document Revised: 09/22/2017 Document Reviewed: 10/25/2015 Elsevier Patient Education  2020 [place] Following a healthy eating pattern may help you to achieve and maintain a healthy body weight, reduce the risk of chronic disease, and live a long and productive life. It is important to follow a healthy eating pattern at an appropriate calorie level for your body. Your nutritional needs should be met primarily through food by choosing a variety of nutrient-rich foods. What are tips for following this plan? Reading food labels  Read labels and choose the following: ? Reduced or low sodium. ? Juices with 100% fruit juice. ? Foods with low saturated fats and high polyunsaturated and monounsaturated fats. ? Foods with whole grains, such as whole wheat, cracked wheat, brown rice, and wild rice. ? Whole grains that are fortified with folic acid. This is recommended for women who are pregnant or who want to become pregnant.  Read labels and avoid the following: ? Foods with a lot of added sugars. These include foods that contain brown sugar, corn sweetener, corn syrup, dextrose, fructose, glucose, high-fructose corn syrup, honey, invert sugar, lactose, malt syrup, maltose, molasses, raw sugar, sucrose, trehalose, or turbinado sugar.  Do not  eat more than the following amounts of added sugar per day:  6 teaspoons (25 g) for women.  9 teaspoons (38 g) for men. ? Foods that contain processed or refined starches and grains. ? Refined grain products, such as white flour, degermed cornmeal, white bread, and white rice. Shopping  Choose nutrient-rich snacks, such as vegetables, whole fruits, and nuts. Avoid high-calorie and high-sugar snacks, such as potato chips, fruit snacks, and candy.  Use oil-based dressings and spreads on foods instead of solid fats such as butter, stick margarine, or cream cheese.  Limit pre-made sauces, mixes, and "instant" products such as flavored rice, instant noodles, and ready-made pasta.  Try more plant-protein sources, such as tofu, tempeh, black beans, edamame, lentils, nuts, and seeds.  Explore eating plans such as the Mediterranean diet or vegetarian diet. Cooking  Use oil to saut or stir-fry foods instead of solid fats such as butter, stick margarine, or lard.  Try baking, boiling, grilling, or broiling instead of frying.  Remove the fatty part of meats before cooking.  Steam vegetables in water or broth. Meal planning   At meals, imagine dividing your plate into fourths: ? One-half of your plate is fruits and vegetables. ? One-fourth of your plate is whole grains. ? One-fourth of your plate is protein, especially lean meats, poultry, eggs, tofu, beans, or nuts.  Include low-fat dairy as part of your daily diet. Lifestyle  Choose healthy options in all settings, including home, work, school, restaurants, or stores.  Prepare your food safely: ? Wash your hands after handling raw meats. ? Keep food preparation  surfaces clean by regularly washing with hot, soapy water. ? Keep raw meats separate from ready-to-eat foods, such as fruits and vegetables. ? Cook seafood, meat, poultry, and eggs to the recommended internal temperature. ? Store foods at safe temperatures. In  general:  Keep cold foods at 2F (4.4C) or below.  Keep hot foods at 12F (60C) or above.  Keep your freezer at [facility] (-17.8C) or below.  Foods are no longer safe to eat when they have been between the temperatures of 40-12F (4.4-60C) for more than 2 hours. What foods should I eat? Fruits Aim to eat 2 cup-equivalents of fresh, canned (in natural juice), or frozen fruits each day. Examples of 1 cup-equivalent of fruit include 1 small apple, 8 large strawberries, 1 cup canned fruit,  cup dried fruit, or 1 cup 100% juice. Vegetables Aim to eat 2-3 cup-equivalents of fresh and frozen vegetables each day, including different varieties and colors. Examples of 1 cup-equivalent of vegetables include 2 medium carrots, 2 cups raw, leafy greens, 1 cup chopped vegetable (raw or cooked), or 1 medium baked potato. Grains Aim to eat 6 ounce-equivalents of whole grains each day. Examples of 1 ounce-equivalent of grains include 1 slice of bread, 1 cup ready-to-eat cereal, 3 cups popcorn, or  cup cooked rice, pasta, or cereal. Meats and other proteins Aim to eat 5-6 ounce-equivalents of protein each day. Examples of 1 ounce-equivalent of protein include 1 egg, 1/2 cup nuts or seeds, or 1 tablespoon (16 g) peanut butter. A cut of meat or fish that is the size of a deck of cards is about 3-4 ounce-equivalents.  Of the protein you eat each week, try to have at least 8 ounces come from seafood. This includes salmon, trout, herring, and anchovies. Dairy Aim to eat 3 cup-equivalents of fat-free or low-fat dairy each day. Examples of 1 cup-equivalent of dairy include 1 cup (240 mL) milk, 8 ounces (250 g) yogurt, 1 ounces (44 g) natural cheese, or 1 cup (240 mL) fortified soy milk. Fats and oils  Aim for about 5 teaspoons (21 g) per day. Choose monounsaturated fats, such as canola and olive oils, avocados, peanut butter, and most nuts, or polyunsaturated fats, such as sunflower, corn, and soybean oils,  walnuts, pine nuts, sesame seeds, sunflower seeds, and flaxseed. Beverages  Aim for six 8-oz glasses of water per day. Limit coffee to three to five 8-oz cups per day.  Limit caffeinated beverages that have added calories, such as soda and energy drinks.  Limit alcohol intake to no more than 1 drink a day for nonpregnant women and 2 drinks a day for men. One drink equals 12 oz of beer (355 mL), 5 oz of wine (148 mL), or 1 oz of hard liquor (44 mL). Seasoning and other foods  Avoid adding excess amounts of salt to your foods. Try flavoring foods with herbs and spices instead of salt.  Avoid adding sugar to foods.  Try using oil-based dressings, sauces, and spreads instead of solid fats. This information is based on general [place] nutrition guidelines. For more information, visit BuildDNA.es. Exact amounts may vary based on your nutrition needs. Summary  A healthy eating plan may help you to maintain a healthy weight, reduce the risk of chronic diseases, and stay active throughout your life.  Plan your meals. Make sure you eat the right portions of a variety of nutrient-rich foods.  Try baking, boiling, grilling, or broiling instead of frying.  Choose healthy options in all settings, including home,  work, school, restaurants, or stores. This information is not intended to replace advice given to you by your health care provider. Make sure you discuss any questions you have with your health care provider. Document Revised: 01/22/2018 Document Reviewed: 01/22/2018 Elsevier Patient Education  [place].

## 2020-03-19 LAB — COMP. METABOLIC PANEL (12)
AST: 22 IU/L (ref 0–40)
Albumin/Globulin Ratio: 1.6 (ref 1.2–2.2)
Albumin: 4.4 g/dL (ref 3.8–4.9)
Alkaline Phosphatase: 86 IU/L (ref 48–121)
BUN/Creatinine Ratio: 15 (ref 9–20)
BUN: 14 mg/dL (ref 6–24)
Bilirubin Total: 0.4 mg/dL (ref 0.0–1.2)
Calcium: 9.9 mg/dL (ref 8.7–10.2)
Chloride: 103 mmol/L (ref 96–106)
Creatinine, Ser: 0.95 mg/dL (ref 0.76–1.27)
GFR calc Af Amer: 107 mL/min/{1.73_m2} (ref >59)
GFR calc non Af Amer: 92 mL/min/{1.73_m2} (ref >59)
Globulin, Total: 2.8 g/dL (ref 1.5–4.5)
Glucose: 99 mg/dL (ref 65–99)
Potassium: 4.2 mmol/L (ref 3.5–5.2)
Sodium: 138 mmol/L (ref 134–144)
Total Protein: 7.2 g/dL (ref 6.0–8.5)

## 2020-03-19 LAB — LIPID PANEL
Chol/HDL Ratio: 6.2 ratio — ABNORMAL HIGH (ref 0.0–5.0)
Cholesterol, Total: 204 mg/dL — ABNORMAL HIGH (ref 100–199)
HDL: 33 mg/dL — ABNORMAL LOW (ref >39)
LDL Chol Calc (NIH): 135 mg/dL — ABNORMAL HIGH (ref 0–99)
Triglycerides: 197 mg/dL — ABNORMAL HIGH (ref 0–149)
VLDL Cholesterol Cal: 36 mg/dL (ref 5–40)

## 2020-03-19 LAB — PSA: Prostate Specific Ag, Serum: 0.7 ng/mL (ref 0.0–4.0)

## 2020-03-19 NOTE — Progress Notes (Signed)
Cholosterol remains elevated but improved. Will recheck in 64mn since he is making lifestyle changes.

## 2020-06-18 ENCOUNTER — Ambulatory Visit: Payer: Self-pay | Admitting: Nurse Practitioner

## 2021-05-08 IMAGING — DX DG CHEST 1V PORT
1 series · 1 of 1 positions shown · non-contrast
Comparison: Radiograph 02/08/2020

CLINICAL DATA: Shortness of breath, fall, loss of consciousness

EXAM:
PORTABLE CHEST 1 VIEW

[chest]
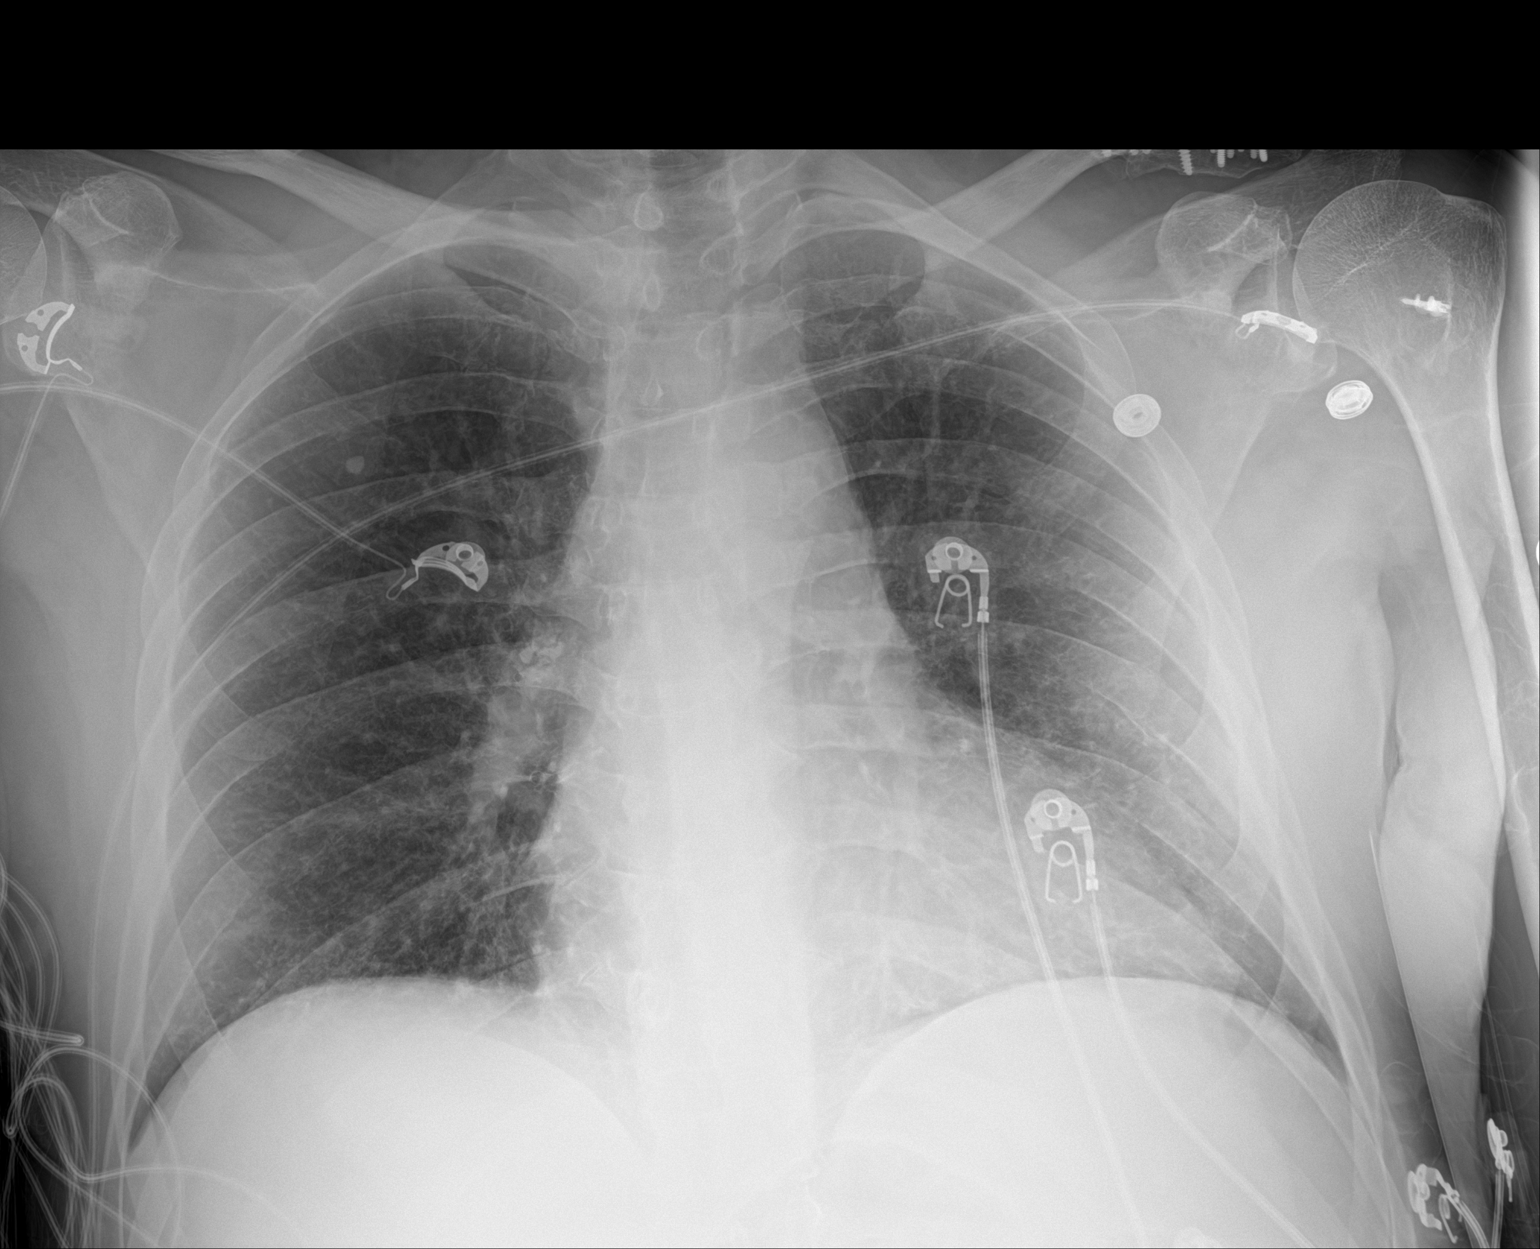

[1 of 1 positions shown; findings below may reference images not displayed]

FINDINGS: There are chronically coarsened interstitial changes towards the
lung bases. More patchy opacity is seen in the periphery of the
right lung base though some increase in the attenuation of the lung
periphery is likely related to body habitus. Calcified granuloma in
the right upper lung is stable from priors. No visible pneumothorax
or effusion. Cardiomediastinal contours are similar to prior. No
acute osseous injury is seen. Remote posttraumatic deformity of the
distal left clavicle with plate and screw fixation and left rotator
cuff repair. Telemetry leads overlie the chest.
IMPRESSION: 1. Patchy opacity in the right lung base periphery. Nonspecific but
could reflect infection or inflammation versus contusive change in
the setting of fall. Appears distinct from the more generalized
increased peripheral attenuation secondary to body habitus.
2. Chronically coarsened interstitial changes.
3. No other acute cardiopulmonary or traumatic findings in the
chest.
4. Stable calcified granuloma in the right upper lung.

## 2021-05-08 IMAGING — CT CT CERVICAL SPINE W/O CM
3 of 5 series · 10 of 33 positions shown, 12 images · non-contrast
Comparison: MR head without contrast 02/07/2020.

CLINICAL DATA: Syncopal episode. Fall. Trauma to back of head.

EXAM:
CT HEAD WITHOUT CONTRAST
CT CERVICAL SPINE WITHOUT CONTRAST
TECHNIQUE: Multidetector CT imaging of the head and cervical spine was
performed following the standard protocol without intravenous
contrast. Multiplanar CT image reconstructions of the cervical spine
were also generated.

[Series 5: c_spine 2.0 st · axial · 0.33mm/px · z∈[+1272,+1336]mm · 2 of 96 slices shown, 3 images]
[im 32/96  soft-tissue]
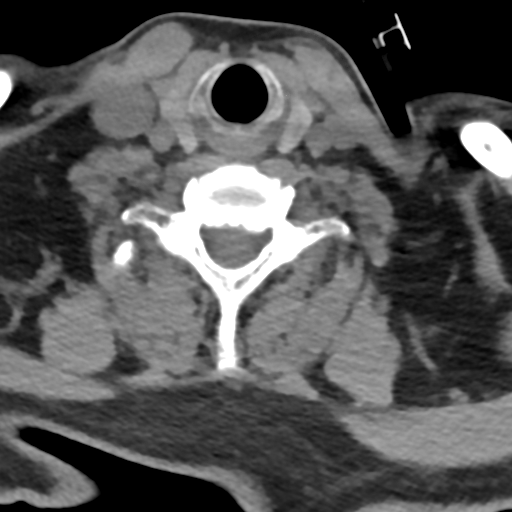
[im 32/96  bone]
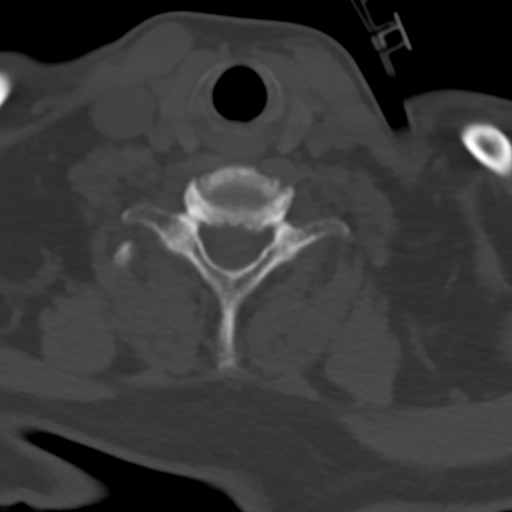
[im 64/96  bone]
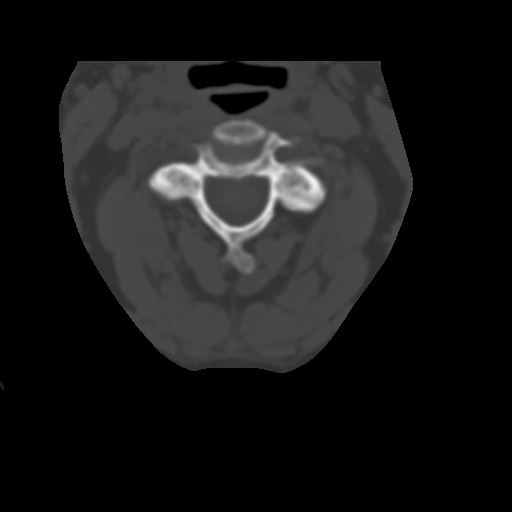

[Series 6: coronal bone · coronal · 0.27mm/px · 3 of 67 slices shown]
[im 14/67  bone]
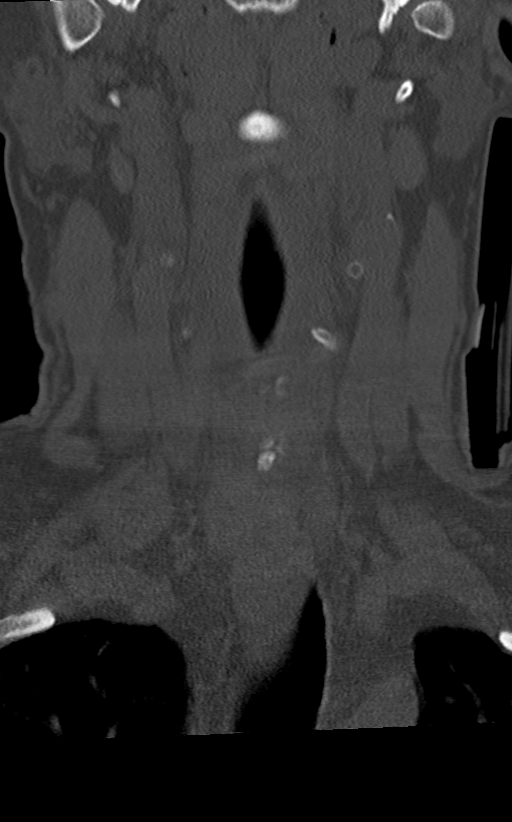
[im 27/67  bone]
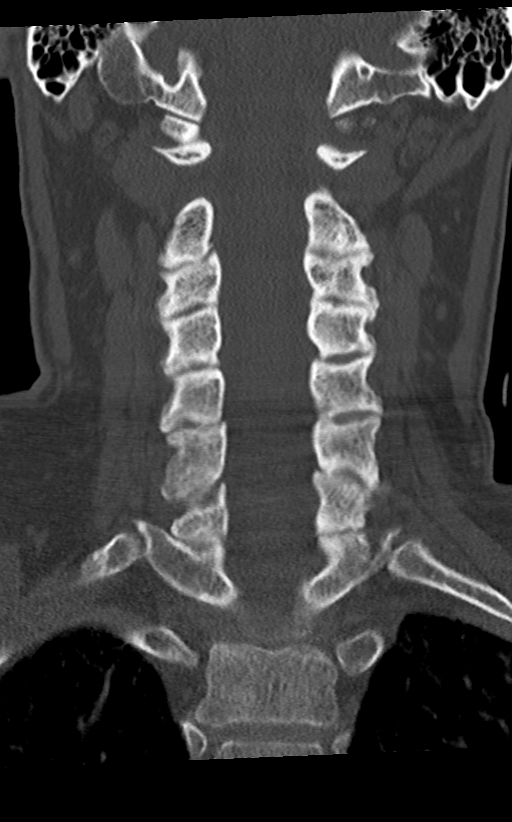
[im 40/67  bone]
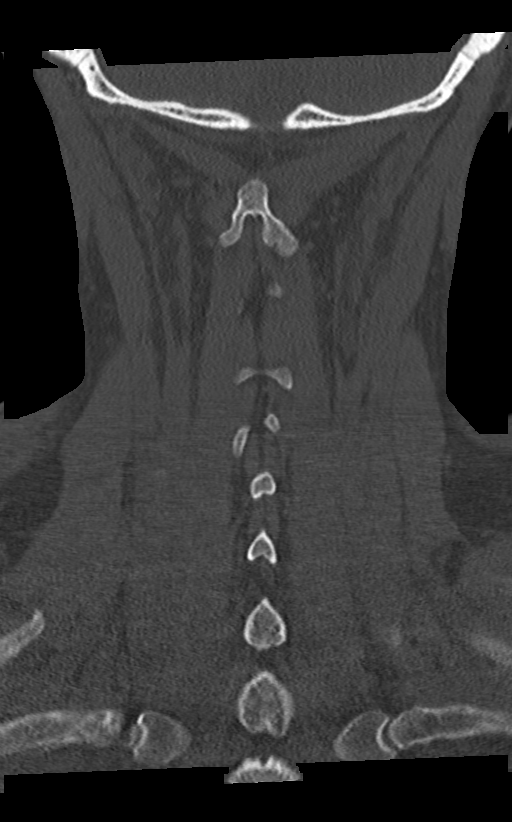

[Series 7: sagittal bone · sagittal · 0.23mm/px · 5 of 61 slices shown, 6 images]
[im 21/61  bone]
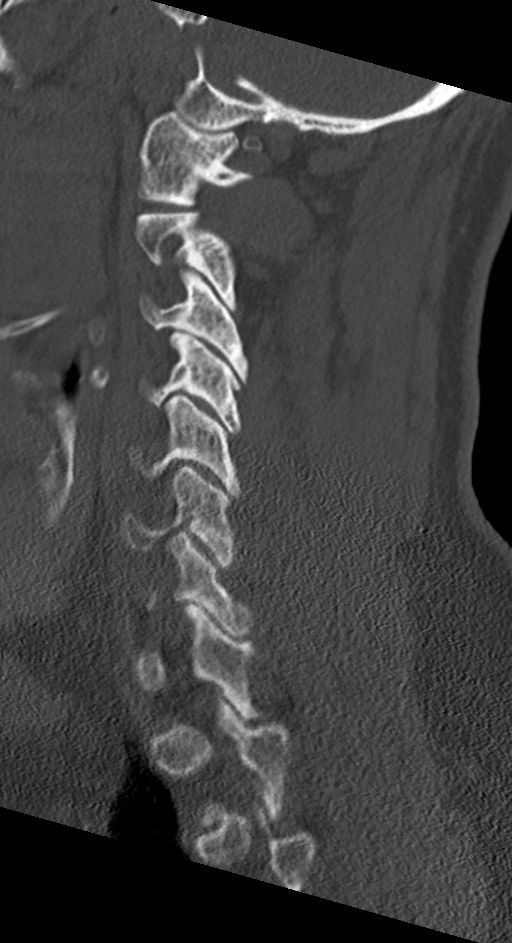
[im 26/61  bone]
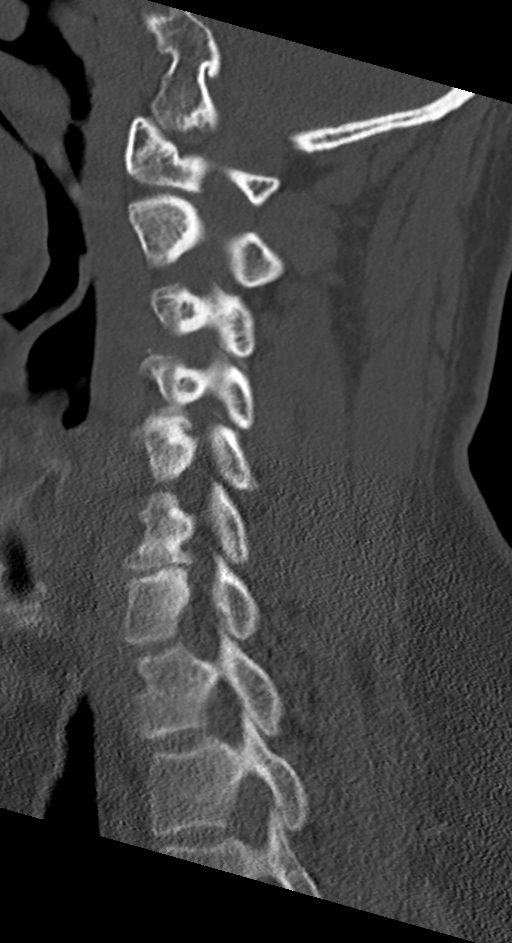
[im 31/61  soft-tissue]
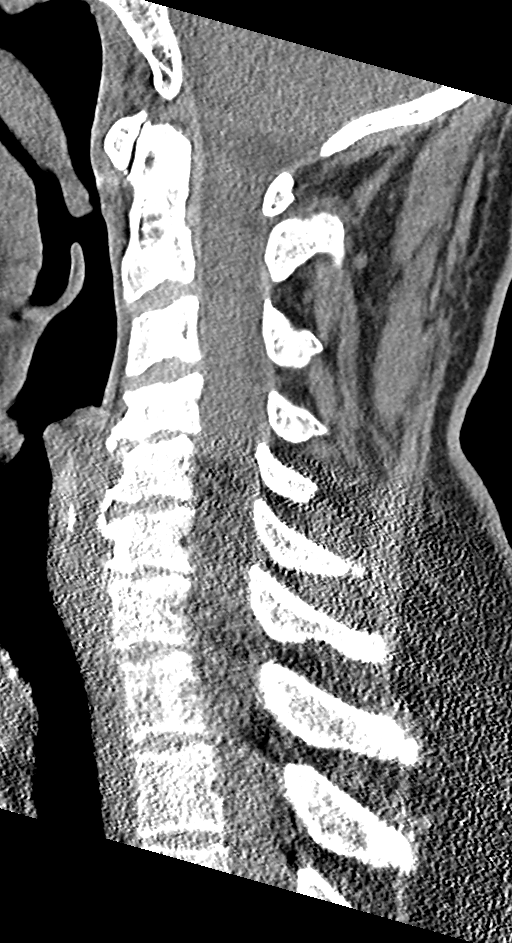
[im 31/61  bone]
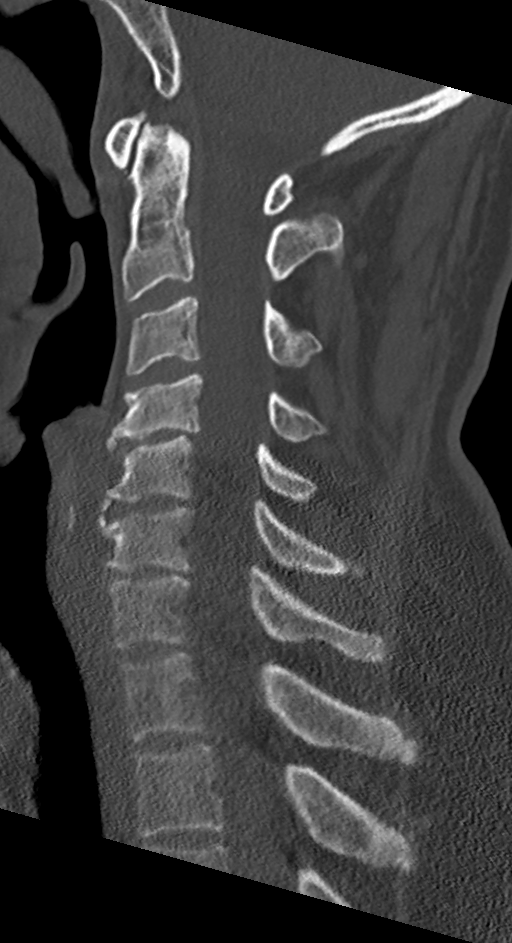
[im 36/61  bone]
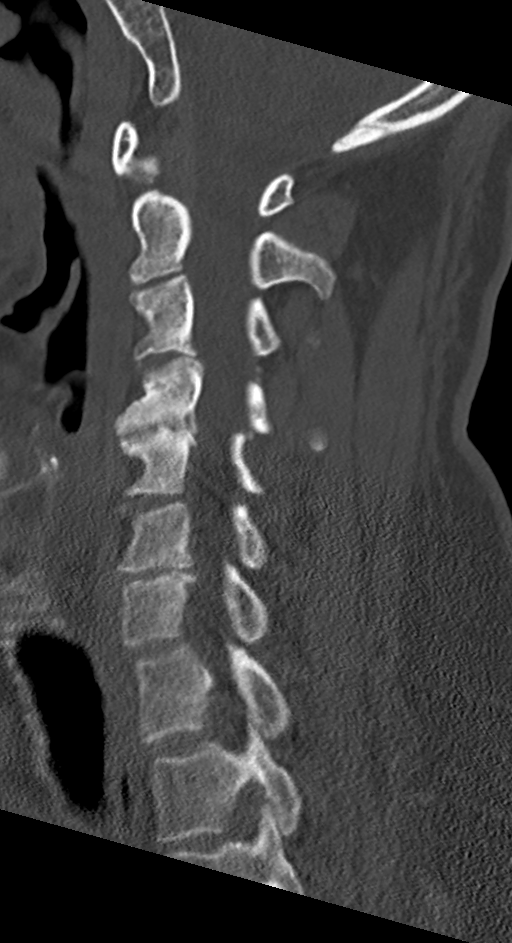
[im 41/61  bone]
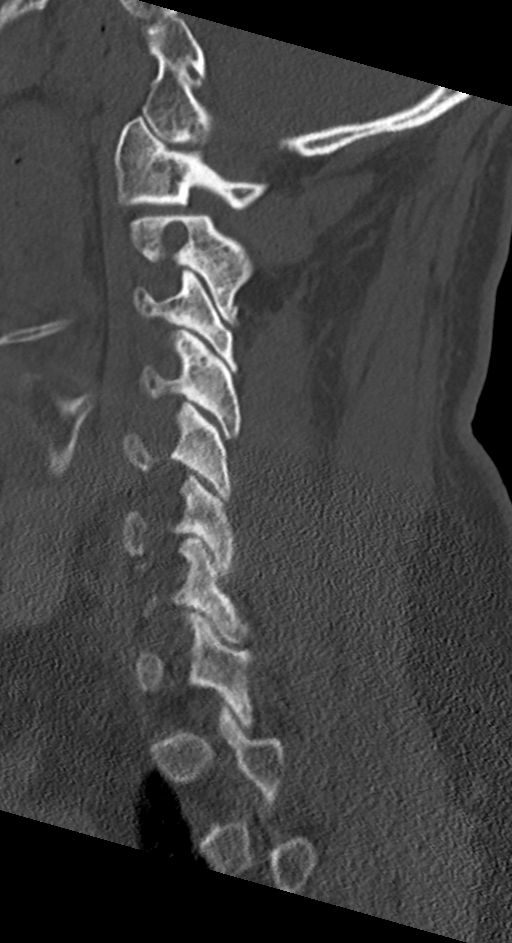

[10 of 33 positions shown; findings below may reference images not displayed]

FINDINGS: CT HEAD FINDINGS

Brain: No acute infarct, hemorrhage, or mass lesion is present. No
significant white matter lesions are present. The ventricles are of
normal size. No significant extraaxial fluid collection is present.
The brainstem and cerebellum are within normal limits.

Vascular: No hyperdense vessel or unexpected calcification.

Skull: Calvarium is intact. No focal lytic or blastic lesions are
present. Left parietal occipital scalp hematoma is present without
underlying fracture. No foreign body is present.

Sinuses/Orbits: The paranasal sinuses and mastoid air cells are
clear. The globes and orbits are within normal limits.

CT CERVICAL SPINE FINDINGS

Alignment: No significant listhesis is present. There is some
straightening of the normal cervical lordosis.

Skull base and vertebrae: Craniocervical junction is normal.
Vertebral body heights are maintained. Endplate degenerative changes
are present at C4-5 left greater than right. Vertebral body heights
are maintained. No acute or healing fractures are present.

Soft tissues and spinal canal: No prevertebral fluid or swelling. No
visible canal hematoma.

Disc levels: Uncovertebral spurring leads to moderate foraminal
stenosis bilaterally at C4-5 right greater than left. Degenerative
changes are present at C3-4 and C5-6 without significant stenosis at
these levels.

Upper chest: The lung apices are clear. Thoracic inlet is within
normal.
IMPRESSION: 1. Left parietal occipital scalp hematoma without underlying
fracture.
2. Normal CT appearance of the brain.
3. Multilevel degenerative changes of the cervical spine as
described. Degenerative changes are greatest at C4-5.
4. No acute or healing fractures of the cervical spine.
